# Patient Record
Sex: Male | Born: 1950
Health system: Southern US, Community
[De-identification: ages and names within clinical notes are randomized; demographics above are authoritative.]

## PROBLEM LIST (undated history)

## (undated) DIAGNOSIS — E785 Hyperlipidemia, unspecified: Secondary | ICD-10-CM

## (undated) DIAGNOSIS — I2510 Atherosclerotic heart disease of native coronary artery without angina pectoris: Secondary | ICD-10-CM

## (undated) DIAGNOSIS — T7840XA Allergy, unspecified, initial encounter: Secondary | ICD-10-CM

## (undated) DIAGNOSIS — J45909 Unspecified asthma, uncomplicated: Secondary | ICD-10-CM

## (undated) DIAGNOSIS — I219 Acute myocardial infarction, unspecified: Secondary | ICD-10-CM

## (undated) DIAGNOSIS — C4491 Basal cell carcinoma of skin, unspecified: Secondary | ICD-10-CM

## (undated) DIAGNOSIS — J302 Other seasonal allergic rhinitis: Secondary | ICD-10-CM

## (undated) DIAGNOSIS — I10 Essential (primary) hypertension: Secondary | ICD-10-CM

## (undated) DIAGNOSIS — B019 Varicella without complication: Secondary | ICD-10-CM

## (undated) HISTORY — PX: COSMETIC SURGERY: SHX468

## (undated) HISTORY — PX: OTHER SURGICAL HISTORY: SHX169

## (undated) HISTORY — DX: Varicella without complication: B01.9

## (undated) HISTORY — DX: Allergy, unspecified, initial encounter: T78.40XA

## (undated) HISTORY — PX: TONSILLECTOMY: SUR1361

## (undated) HISTORY — PX: KIDNEY STONE SURGERY: SHX686

## (undated) HISTORY — DX: Essential (primary) hypertension: I10

## (undated) HISTORY — DX: Unspecified asthma, uncomplicated: J45.909

## (undated) HISTORY — DX: Atherosclerotic heart disease of native coronary artery without angina pectoris: I25.10

## (undated) HISTORY — DX: Basal cell carcinoma of skin, unspecified: C44.91

## (undated) HISTORY — PX: EYE SURGERY: SHX253

## (undated) HISTORY — PX: COLONOSCOPY: SHX174

## (undated) HISTORY — PX: CORONARY ANGIOPLASTY WITH STENT PLACEMENT: SHX49

## (undated) HISTORY — PX: MOHS SURGERY: SUR867

## (undated) HISTORY — PX: WRIST GANGLION EXCISION: SUR520

## (undated) HISTORY — DX: Hyperlipidemia, unspecified: E78.5

---

## 1996-08-20 DIAGNOSIS — J45909 Unspecified asthma, uncomplicated: Secondary | ICD-10-CM

## 1996-08-20 HISTORY — DX: Unspecified asthma, uncomplicated: J45.909

## 1998-05-27 ENCOUNTER — Ambulatory Visit (HOSPITAL_COMMUNITY): Admission: RE | Admit: 1998-05-27 | Discharge: 1998-05-27 | Payer: Self-pay | Admitting: Internal Medicine

## 2003-10-15 ENCOUNTER — Encounter: Payer: Self-pay | Admitting: Internal Medicine

## 2004-07-06 ENCOUNTER — Ambulatory Visit: Payer: Self-pay | Admitting: Internal Medicine

## 2004-08-24 ENCOUNTER — Ambulatory Visit: Payer: Self-pay | Admitting: Internal Medicine

## 2004-10-16 ENCOUNTER — Ambulatory Visit: Payer: Self-pay | Admitting: Internal Medicine

## 2005-06-19 ENCOUNTER — Ambulatory Visit: Payer: Self-pay | Admitting: Internal Medicine

## 2006-01-09 ENCOUNTER — Ambulatory Visit: Payer: Self-pay | Admitting: Internal Medicine

## 2006-03-28 ENCOUNTER — Ambulatory Visit: Payer: Self-pay | Admitting: Internal Medicine

## 2006-12-18 DIAGNOSIS — E785 Hyperlipidemia, unspecified: Secondary | ICD-10-CM

## 2006-12-19 ENCOUNTER — Ambulatory Visit: Payer: Self-pay | Admitting: Internal Medicine

## 2006-12-19 LAB — CONVERTED CEMR LAB
ALT: 21 units/L (ref 0–40)
Albumin: 4.3 g/dL (ref 3.5–5.2)
Alkaline Phosphatase: 51 units/L (ref 39–117)
BUN: 19 mg/dL (ref 6–23)
Basophils Absolute: 0 10*3/uL (ref 0.0–0.1)
Basophils Relative: 0.3 % (ref 0.0–1.0)
CO2: 32 meq/L (ref 19–32)
Calcium: 9.4 mg/dL (ref 8.4–10.5)
Cholesterol: 158 mg/dL (ref 0–200)
GFR calc Af Amer: 112 mL/min
HDL: 34.2 mg/dL — ABNORMAL LOW (ref >39.0)
Hemoglobin: 16.2 g/dL (ref 13.0–17.0)
LDL Cholesterol: 104 mg/dL — ABNORMAL HIGH (ref 0–99)
Lymphocytes Relative: 28.2 % (ref 12.0–46.0)
MCHC: 34.9 g/dL (ref 30.0–36.0)
Monocytes Absolute: 0.4 10*3/uL (ref 0.2–0.7)
Monocytes Relative: 7.7 % (ref 3.0–11.0)
Neutro Abs: 3.3 10*3/uL (ref 1.4–7.7)
Platelets: 225 10*3/uL (ref 150–400)
Potassium: 4.5 meq/L (ref 3.5–5.1)
RDW: 12.8 % (ref 11.5–14.6)
TSH: 2.79 microintl units/mL (ref 0.35–5.50)
Triglycerides: 97 mg/dL (ref 0–149)
VLDL: 19 mg/dL (ref 0–40)

## 2007-01-27 ENCOUNTER — Ambulatory Visit: Payer: Self-pay | Admitting: Internal Medicine

## 2007-02-06 ENCOUNTER — Encounter: Payer: Self-pay | Admitting: Internal Medicine

## 2007-02-12 ENCOUNTER — Encounter: Payer: Self-pay | Admitting: Internal Medicine

## 2007-02-17 ENCOUNTER — Ambulatory Visit: Payer: Self-pay | Admitting: Internal Medicine

## 2007-02-17 DIAGNOSIS — E162 Hypoglycemia, unspecified: Secondary | ICD-10-CM

## 2007-07-08 ENCOUNTER — Ambulatory Visit: Payer: Self-pay | Admitting: Internal Medicine

## 2007-12-19 ENCOUNTER — Ambulatory Visit: Payer: Self-pay | Admitting: Internal Medicine

## 2007-12-19 DIAGNOSIS — J309 Allergic rhinitis, unspecified: Secondary | ICD-10-CM

## 2008-01-19 ENCOUNTER — Ambulatory Visit: Payer: Self-pay | Admitting: Internal Medicine

## 2008-01-19 ENCOUNTER — Encounter (INDEPENDENT_AMBULATORY_CARE_PROVIDER_SITE_OTHER): Payer: Self-pay | Admitting: *Deleted

## 2008-01-19 LAB — CONVERTED CEMR LAB
OCCULT 2: NEGATIVE
OCCULT 3: NEGATIVE

## 2008-05-20 ENCOUNTER — Ambulatory Visit: Payer: Self-pay | Admitting: Internal Medicine

## 2008-06-08 ENCOUNTER — Ambulatory Visit: Payer: Self-pay | Admitting: Internal Medicine

## 2008-06-08 LAB — CONVERTED CEMR LAB: Rapid Strep: NEGATIVE

## 2008-08-24 ENCOUNTER — Encounter (INDEPENDENT_AMBULATORY_CARE_PROVIDER_SITE_OTHER): Payer: Self-pay | Admitting: *Deleted

## 2008-12-23 ENCOUNTER — Ambulatory Visit: Payer: Self-pay | Admitting: Internal Medicine

## 2008-12-23 LAB — CONVERTED CEMR LAB
ALT: 16 units/L (ref 0–53)
AST: 24 units/L (ref 0–37)
Albumin: 4.3 g/dL (ref 3.5–5.2)
Alkaline Phosphatase: 49 units/L (ref 39–117)
Calcium: 9.3 mg/dL (ref 8.4–10.5)
Creatinine, Ser: 1 mg/dL (ref 0.4–1.5)
Eosinophils Relative: 0.9 % (ref 0.0–5.0)
HCT: 44.9 % (ref 39.0–52.0)
Hemoglobin: 15.4 g/dL (ref 13.0–17.0)
Lymphs Abs: 1 10*3/uL (ref 0.7–4.0)
Monocytes Relative: 6.3 % (ref 3.0–12.0)
Neutro Abs: 5.4 10*3/uL (ref 1.4–7.7)
PSA: 0.42 ng/mL (ref 0.10–4.00)
Total CHOL/HDL Ratio: 6
Total Protein: 7 g/dL (ref 6.0–8.3)
Triglycerides: 105 mg/dL (ref 0.0–149.0)
WBC: 6.9 10*3/uL (ref 4.5–10.5)

## 2008-12-24 ENCOUNTER — Ambulatory Visit: Payer: Self-pay | Admitting: Internal Medicine

## 2009-02-03 ENCOUNTER — Ambulatory Visit: Payer: Self-pay | Admitting: Internal Medicine

## 2009-08-24 ENCOUNTER — Telehealth (INDEPENDENT_AMBULATORY_CARE_PROVIDER_SITE_OTHER): Payer: Self-pay | Admitting: *Deleted

## 2009-11-24 ENCOUNTER — Telehealth (INDEPENDENT_AMBULATORY_CARE_PROVIDER_SITE_OTHER): Payer: Self-pay | Admitting: *Deleted

## 2009-12-19 ENCOUNTER — Ambulatory Visit: Payer: Self-pay | Admitting: Internal Medicine

## 2009-12-19 LAB — CONVERTED CEMR LAB
ALT: 20 units/L (ref 0–53)
AST: 26 units/L (ref 0–37)
Albumin: 4.1 g/dL (ref 3.5–5.2)
Basophils Relative: 0.5 % (ref 0.0–3.0)
Calcium: 8.9 mg/dL (ref 8.4–10.5)
Cholesterol: 178 mg/dL (ref 0–200)
Eosinophils Relative: 3.1 % (ref 0.0–5.0)
GFR calc non Af Amer: 91.61 mL/min (ref >60)
HCT: 42.7 % (ref 39.0–52.0)
Lymphs Abs: 1.4 10*3/uL (ref 0.7–4.0)
MCV: 90.5 fL (ref 78.0–100.0)
Monocytes Absolute: 0.4 10*3/uL (ref 0.1–1.0)
Neutro Abs: 3.3 10*3/uL (ref 1.4–7.7)
PSA: 1.32 ng/mL (ref 0.10–4.00)
Platelets: 213 10*3/uL (ref 150.0–400.0)
Sodium: 143 meq/L (ref 135–145)
TSH: 3.44 microintl units/mL (ref 0.35–5.50)
Total Protein: 6.5 g/dL (ref 6.0–8.3)
Triglycerides: 105 mg/dL (ref 0.0–149.0)
WBC: 5.2 10*3/uL (ref 4.5–10.5)

## 2009-12-20 ENCOUNTER — Encounter: Payer: Self-pay | Admitting: Internal Medicine

## 2009-12-26 ENCOUNTER — Ambulatory Visit: Payer: Self-pay | Admitting: Internal Medicine

## 2009-12-26 LAB — CONVERTED CEMR LAB
Bilirubin Urine: NEGATIVE
Protein, U semiquant: NEGATIVE
Urobilinogen, UA: 0.2

## 2009-12-27 ENCOUNTER — Ambulatory Visit: Payer: Self-pay | Admitting: Internal Medicine

## 2009-12-27 LAB — CONVERTED CEMR LAB: OCCULT 3: NEGATIVE

## 2009-12-28 ENCOUNTER — Encounter (INDEPENDENT_AMBULATORY_CARE_PROVIDER_SITE_OTHER): Payer: Self-pay | Admitting: *Deleted

## 2010-01-09 ENCOUNTER — Ambulatory Visit: Payer: Self-pay | Admitting: Internal Medicine

## 2010-01-09 LAB — CONVERTED CEMR LAB
Glucose, Urine, Semiquant: NEGATIVE
Ketones, urine, test strip: NEGATIVE
Specific Gravity, Urine: 1.01
pH: 6.5

## 2010-06-01 ENCOUNTER — Ambulatory Visit: Payer: Self-pay | Admitting: Internal Medicine

## 2010-09-17 LAB — CONVERTED CEMR LAB
AST: 28 units/L (ref 0–37)
Basophils Absolute: 0 10*3/uL (ref 0.0–0.1)
Basophils Relative: 0.3 % (ref 0.0–1.0)
Bilirubin, Direct: 0.1 mg/dL (ref 0.0–0.3)
Chloride: 106 meq/L (ref 96–112)
Cholesterol, target level: 200 mg/dL
Cholesterol: 156 mg/dL (ref 0–200)
Creatinine, Ser: 1 mg/dL (ref 0.4–1.5)
Eosinophils Absolute: 0.2 10*3/uL (ref 0.0–0.7)
GFR calc non Af Amer: 82 mL/min
HDL: 27.2 mg/dL — ABNORMAL LOW (ref >39.0)
LDL Cholesterol: 108 mg/dL — ABNORMAL HIGH (ref 0–99)
Lymphocytes Relative: 34.6 % (ref 12.0–46.0)
MCHC: 34 g/dL (ref 30.0–36.0)
MCV: 91.8 fL (ref 78.0–100.0)
Neutrophils Relative %: 54.3 % (ref 43.0–77.0)
PSA: 0.51 ng/mL (ref 0.10–4.00)
Platelets: 202 10*3/uL (ref 150–400)
RBC: 4.97 M/uL (ref 4.22–5.81)
RDW: 12.7 % (ref 11.5–14.6)
Sodium: 145 meq/L (ref 135–145)
TSH: 2.49 microintl units/mL (ref 0.35–5.50)
Total Bilirubin: 0.8 mg/dL (ref 0.3–1.2)
Triglycerides: 105 mg/dL (ref 0–149)
VLDL: 21 mg/dL (ref 0–40)

## 2010-09-21 NOTE — Progress Notes (Signed)
Summary: LAB ORDERS NEEDED  Phone Note From Other Clinic   Summary of Call: UDIP,Stool Cards,PSA,TSH,CBCD,Lipid,Hep,BMP, v70.0,272.4,995.20   Dale Gonzalez  August 25, 2009 11:42 AM  Summary of Call: PATIENT COMING FOR CPX ON 12-30-2009, AND FASTING LABS ON 12-26-2009, MAY I PLEASE HAVE LAB ORDERS? Initial call taken by: Magdalen Spatz [facility],  August 24, 2009 8:48 AM  Follow-up for Phone Call        LABS ENTERED. Follow-up by: Magdalen Spatz [facility],  August 25, 2009 1:15 PM

## 2010-09-21 NOTE — Progress Notes (Signed)
Summary: Phone-cpx labs  Phone Note Call from Patient   Caller: Patient Summary of Call: Patient has an appt on May 9,2011 for a cpx and requesting labs prior. Please advise Initial call taken by: Barb Merino,  November 24, 2009 1:03 PM  Follow-up for Phone Call        TLB-Lipid Panel [80061-LIPID] TLB-CBC Platelet - w/Differential [85025-CBCD] TLB-TSH (Thyroid Stimulating Hormone) [04540-JWJ] TLB-Hepatic/Liver Function Pnl [80076-HEPATIC] TLB-BMP (Basic Metabolic Panel-BMET) [80048-METABOL] TLB-PSA (Prostate Specific Antigen) [19147-WGN] STOOL CARDS & UDIP V70.0 Follow-up by: Shonna Chock,  November 24, 2009 5:16 PM    Additional Follow-up for Phone Call Additional follow up Details #2::    :abs are scheduled on May 2,2011 Follow-up by: Barb Merino,  November 25, 2009 8:07 AM
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
## 2010-09-21 NOTE — Assessment & Plan Note (Signed)
Summary: flu shot/cbs  Nurse Visit  CC: Flu shot./kb   Allergies: 1)  ! * Cialis  Orders Added: 1)  Admin 1st Vaccine [90471] 2)  Flu Vaccine 52yrs + [[address]]           Flu Vaccine Consent Questions     Do you have a history of severe allergic reactions to this vaccine? no    Any prior history of allergic reactions to egg and/or gelatin? no    Do you have a sensitivity to the preservative Thimersol? no    Do you have a past history of Guillan-Barre Syndrome? no    Do you currently have an acute febrile illness? no    Have you ever had a severe reaction to latex? no    Vaccine information given and explained to patient? yes    Are you currently pregnant? no    Lot Number:[id number]   Exp Date:02/17/2011   Site Given  Right Deltoid IMu

## 2010-09-21 NOTE — Letter (Signed)
Summary: Results Follow up Letter  [facility] [place]  [address] [place], [place] [address]   Phone: [phone]  Fax: [phone]    12/28/2009 MRN: [id number]  MARWIN PRIMMER [address] [place], [place]  [address]  Dear Mr. Allport,  The following are the results of your recent test(s):  Test         Result    Pap Smear:        Normal _____  Not Normal _____ Comments: ______________________________________________________ Cholesterol: LDL(Bad cholesterol):         Your goal is less than:         HDL (Good cholesterol):       Your goal is more than: Comments:  ______________________________________________________ Mammogram:        Normal _____  Not Normal _____ Comments:  ___________________________________________________________________ Hemoccult:        Normal __X___  Not normal _______ Comments:    _____________________________________________________________________ Other Tests:    We routinely do not discuss normal results over the telephone.  If you desire a copy of the results, or you have any questions about this information we can discuss them at your next office visit.   Sincerely,

## 2010-12-19 ENCOUNTER — Telehealth: Payer: Self-pay | Admitting: Internal Medicine

## 2010-12-19 NOTE — Telephone Encounter (Signed)
Patient has cpx on 5/8---called today and scheduled his cpx labs for tomorrow 5/2---what orders and codes do you need??    Thanks so much

## 2010-12-19 NOTE — Telephone Encounter (Signed)
Entered lab info on 5/2 labs

## 2010-12-19 NOTE — Telephone Encounter (Signed)
TLB-Lipid Panel [80061-LIPID] TLB-CBC Platelet - w/Differential [85025-CBCD] TLB-TSH (Thyroid Stimulating Hormone) [84443-TSH] TLB-Hepatic/Liver Function Pnl [80076-HEPATIC] TLB-BMP (Basic Metabolic Panel-BMET) [80048-METABOL] TLB-PSA (Prostate Specific Antigen) [[address]-UEA] STOOL CARDS & UDIP V70.0/272.4

## 2010-12-20 ENCOUNTER — Other Ambulatory Visit (INDEPENDENT_AMBULATORY_CARE_PROVIDER_SITE_OTHER): Payer: PRIVATE HEALTH INSURANCE

## 2010-12-20 DIAGNOSIS — Z0000 Encounter for general adult medical examination without abnormal findings: Secondary | ICD-10-CM

## 2010-12-20 LAB — CBC WITH DIFFERENTIAL/PLATELET
Eosinophils Relative: 2 % (ref 0.0–5.0)
HCT: 47.2 % (ref 39.0–52.0)
Lymphs Abs: 1.7 10*3/uL (ref 0.7–4.0)
Monocytes Relative: 7.8 % (ref 3.0–12.0)
Platelets: 205 10*3/uL (ref 150.0–400.0)
WBC: 8.8 10*3/uL (ref 4.5–10.5)

## 2010-12-20 LAB — LIPID PANEL
HDL: 31.8 mg/dL — ABNORMAL LOW (ref >39.00)
LDL Cholesterol: 130 mg/dL — ABNORMAL HIGH (ref 0–99)
Total CHOL/HDL Ratio: 6
Triglycerides: 159 mg/dL — ABNORMAL HIGH (ref 0.0–149.0)
VLDL: 31.8 mg/dL (ref 0.0–40.0)

## 2010-12-20 LAB — BASIC METABOLIC PANEL
Calcium: 9.3 mg/dL (ref 8.4–10.5)
Creatinine, Ser: 1.1 mg/dL (ref 0.4–1.5)
GFR: 75.59 mL/min (ref >60.00)
Glucose, Bld: 83 mg/dL (ref 70–99)
Sodium: 140 mEq/L (ref 135–145)

## 2010-12-20 LAB — POCT URINALYSIS DIPSTICK
Glucose, UA: NEGATIVE
Nitrite, UA: NEGATIVE
Protein, UA: NEGATIVE
Spec Grav, UA: 1.015
Urobilinogen, UA: NEGATIVE

## 2010-12-20 LAB — TSH: TSH: 3.62 u[IU]/mL (ref 0.35–5.50)

## 2010-12-20 LAB — HEPATIC FUNCTION PANEL
AST: 24 U/L (ref 0–37)
Albumin: 4.2 g/dL (ref 3.5–5.2)

## 2010-12-22 LAB — URINE CULTURE: Organism ID, Bacteria: NO GROWTH

## 2010-12-23 ENCOUNTER — Encounter: Payer: Self-pay | Admitting: Internal Medicine

## 2010-12-26 ENCOUNTER — Telehealth: Payer: Self-pay | Admitting: Internal Medicine

## 2010-12-26 ENCOUNTER — Encounter: Payer: Self-pay | Admitting: Internal Medicine

## 2010-12-26 ENCOUNTER — Ambulatory Visit (INDEPENDENT_AMBULATORY_CARE_PROVIDER_SITE_OTHER): Payer: PRIVATE HEALTH INSURANCE | Admitting: Internal Medicine

## 2010-12-26 VITALS — BP 116/70 | HR 60 | Temp 98.4°F | Resp 14 | Ht 67.25 in | Wt 174.2 lb

## 2010-12-26 DIAGNOSIS — Z0000 Encounter for general adult medical examination without abnormal findings: Secondary | ICD-10-CM

## 2010-12-26 DIAGNOSIS — E785 Hyperlipidemia, unspecified: Secondary | ICD-10-CM

## 2010-12-26 NOTE — Telephone Encounter (Signed)
Added info to 5/14 labs

## 2010-12-26 NOTE — Telephone Encounter (Signed)
Hematuria 599.7

## 2010-12-26 NOTE — Telephone Encounter (Signed)
Per instructions on 5/8 office visit; "repeat urine dip in 6 days after standing from bike riding"---what code do you need?    Has lab appt for 5/14 at 8:15     thanks
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
## 2010-12-26 NOTE — Assessment & Plan Note (Signed)
NMR Lipoprofile 2005: LDL 120 (1426/763), HDL 29, TG 113.  LDL  Goal =  < 110, ideal = < 80. Father MI @ 39.
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
## 2010-12-28 ENCOUNTER — Encounter: Payer: Self-pay | Admitting: Internal Medicine

## 2010-12-29 ENCOUNTER — Other Ambulatory Visit: Payer: Self-pay | Admitting: *Deleted

## 2010-12-29 DIAGNOSIS — R319 Hematuria, unspecified: Secondary | ICD-10-CM

## 2011-01-01 ENCOUNTER — Other Ambulatory Visit (INDEPENDENT_AMBULATORY_CARE_PROVIDER_SITE_OTHER): Payer: PRIVATE HEALTH INSURANCE

## 2011-01-01 DIAGNOSIS — R319 Hematuria, unspecified: Secondary | ICD-10-CM

## 2011-01-01 LAB — POCT URINALYSIS DIPSTICK
Bilirubin, UA: NEGATIVE
Glucose, UA: NEGATIVE
Leukocytes, UA: NEGATIVE
Protein, UA: NEGATIVE
Urobilinogen, UA: 0.2

## 2011-01-04 ENCOUNTER — Ambulatory Visit (INDEPENDENT_AMBULATORY_CARE_PROVIDER_SITE_OTHER): Payer: PRIVATE HEALTH INSURANCE | Admitting: *Deleted

## 2011-01-04 DIAGNOSIS — Z23 Encounter for immunization: Secondary | ICD-10-CM

## 2011-01-04 LAB — URINE CULTURE: Colony Count: NO GROWTH
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
## 2011-01-05 NOTE — Letter (Signed)
February 19, 2007    Kathyrn Lass.  [employer]  [address]  [place], [place] [address]   RE:  FRANKE, MENTER  MRN:  [id number]  /  DOB:  [birth year]   Dear Mr. Durward Parcel:   I saw Mr. Madilyn Fireman on June 30 to address concerns about his  health history and appropriateness for insurance.   Specific questions include a nuclear stress test and a question of  transient global anemia.   Unfortunately, the global anemia  is a typographical error referring  to an apparent reactive hypoglycemic episode, which occurred  approximately an hour after an intensive exercise program.  He actually  experienced some mild light-headedness with poor recall of immediate  events after the workout.  His workout consisted of incredibly high  cardiovascular exercise for up to an hour maintaining heart rates in the  120s.  These episodes occured in  November 2005 and January 2006.  There  has been no recurrence as he has been instructed to employ protein  supplement prior to such intense exercise program to prevent any  hypoglycemia.   His stress EKG will be retrieved from storage.  This was done as he had  planned to compete in a series of  marathons.   He has completed several 26-mile marathons, as well as half marathons.  He continues to exercise for over 3 to 4 hours a week at extremely high  levels with no cardiopulmonary symptoms.   My exam reveals no cardiopulmonary or neurologic deficits.   There would be no contraindications to insurability and there would be  no restrictions to any activities.  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 Wissmann is one  of the most physically fit  individuals with whom I have had the pleasure of working.   If additional information is needed, please do not hesitate to contact  me.    Sincerely,      Titus Dubin. Alwyn Ren, MD,FACP,FCCP  Electronically Signed    [facility]  DD: 02/19/2007  DT: 02/19/2007   Job #: [id number]   CC:    Darl Householder

## 2011-01-23 ENCOUNTER — Telehealth: Payer: Self-pay

## 2011-01-23 MED ORDER — AZITHROMYCIN 250 MG PO TABS: 250.0000 mg | ORAL_TABLET | Freq: Every day | ORAL | Status: AC

## 2011-01-23 NOTE — Telephone Encounter (Signed)
Pt called wife diagnosed with strep pt says he also is starting to develop sore throat. Hop would you like to treat husband since he has been exposed?

## 2011-01-23 NOTE — Telephone Encounter (Signed)
Pt aware rx sent to pharmacy.

## 2011-01-23 NOTE — Telephone Encounter (Signed)
Zpack if not allergic

## 2011-09-03 ENCOUNTER — Telehealth: Payer: Self-pay | Admitting: Internal Medicine

## 2011-09-03 DIAGNOSIS — Z0000 Encounter for general adult medical examination without abnormal findings: Secondary | ICD-10-CM

## 2011-09-03 DIAGNOSIS — E785 Hyperlipidemia, unspecified: Secondary | ICD-10-CM

## 2011-09-03 NOTE — Telephone Encounter (Signed)
Patient has cpe for 5-9. He states that he always comes in a week early for labs. Please order.

## 2011-09-04 NOTE — Telephone Encounter (Signed)
Appointment scheduled and future orders placed

## 2011-10-01 ENCOUNTER — Telehealth: Payer: Self-pay

## 2011-10-01 NOTE — Telephone Encounter (Signed)
Dr.Hopper please place order for colonoscopy and close encounter

## 2011-10-02 ENCOUNTER — Other Ambulatory Visit: Payer: Self-pay | Admitting: Internal Medicine

## 2011-10-02 ENCOUNTER — Encounter: Payer: Self-pay | Admitting: Gastroenterology

## 2011-10-02 DIAGNOSIS — Z1211 Encounter for screening for malignant neoplasm of colon: Secondary | ICD-10-CM

## 2011-11-22 ENCOUNTER — Encounter: Payer: Self-pay | Admitting: Internal Medicine

## 2011-11-22 ENCOUNTER — Ambulatory Visit (INDEPENDENT_AMBULATORY_CARE_PROVIDER_SITE_OTHER): Payer: PRIVATE HEALTH INSURANCE | Admitting: Internal Medicine

## 2011-11-22 VITALS — BP 124/78 | HR 75 | Temp 98.0°F | Wt 177.0 lb

## 2011-11-22 DIAGNOSIS — J209 Acute bronchitis, unspecified: Secondary | ICD-10-CM

## 2011-11-22 MED ORDER — MOXIFLOXACIN HCL 400 MG PO TABS: 400.0000 mg | ORAL_TABLET | Freq: Every day | ORAL | Status: DC

## 2011-11-22 MED ORDER — HYDROCODONE-HOMATROPINE 5-1.5 MG/5ML PO SYRP: 5.0000 mL | ORAL_SOLUTION | Freq: Four times a day (QID) | ORAL | Status: DC | PRN

## 2011-11-22 MED ORDER — PREDNISONE 20 MG PO TABS: 20.0000 mg | ORAL_TABLET | Freq: Two times a day (BID) | ORAL | Status: AC

## 2011-11-22 NOTE — Progress Notes (Signed)
  Subjective:    Patient ID: Dale Gonzalez, male    DOB: 1951-05-12, 61 y.o.   MRN: [id number]  HPI Respiratory tract infection Onset/symptoms:11/16/11 as ST  Exposures (illness/environmental/extrinsic):no Progression of symptoms:to cough from [facility] Treatments/response:Symbicort w/o benefit Present symptoms: Fever/chills/sweats:no Frontal headache:no Facial pain:no Nasal purulence:no Sore throat:not now Dental pain:no Lymphadenopathy:no Wheezing/shortness of breath:yes Cough/sputum/hemoptysis:green with streaks of red Pleuritic pain:no Associated extrinsic/allergic symptoms:itchy eyes/ sneezing:no Past medical history: Seasonal allergies : yes/asthma:no Smoking history:never           Review of Systems     Objective:   Physical Exam General appearance:good health ;well nourished; no acute distress or increased work of breathing is present but horrific paroxysmal cough.  No  lymphadenopathy about the head, neck, or axilla noted.   Eyes: No conjunctival inflammation or lid edema is present.   Ears:  External ear exam shows no significant lesions or deformities.  Otoscopic examination reveals clear canals, tympanic membranes are intact bilaterally without bulging, retraction, inflammation or discharge.  Nose:  External nasal examination shows no deformity or inflammation. Nasal mucosa are dry  without lesions or exudates. No septal dislocation or deviation.No obstruction to airflow.   Oral exam: Dental hygiene is good; lips and gums are healthy appearing.There is no oropharyngeal erythema or exudate noted. Hoarse  Neck:  No deformities, thyromegaly, masses, or tenderness noted.   Supple with full range of motion without pain.   Heart:  Slow rate and regular rhythm. S1 and S2 normal without gallop, murmur, click, rub or other extra sounds.   Lungs:Chest clear to auscultation; no wheezes, rhonchi,rales ,or rubs present.No increased work of breathing.  Severe , paroxysmal,  brassy cough  Extremities:  No cyanosis, edema, or clubbing  noted    Skin: Warm & dry           Assessment & Plan:  #1 acute bronchitis with probable  Bronchospasm manifested as paroxysmal cough. Streaky hemoptysis from severe cough Plan: See orders and recommendations

## 2011-11-22 NOTE — Patient Instructions (Signed)
Order for x-rays entered into  the computer; these will be performed at [address] across from [facility]. No appointment is necessary. 

## 2011-11-23 ENCOUNTER — Ambulatory Visit (INDEPENDENT_AMBULATORY_CARE_PROVIDER_SITE_OTHER)
Admission: RE | Admit: 2011-11-23 | Discharge: 2011-11-23 | Disposition: A | Discharge status: Home / Self Care | Payer: PRIVATE HEALTH INSURANCE | Source: Ambulatory Visit | Attending: Internal Medicine | Admitting: Internal Medicine

## 2011-11-23 DIAGNOSIS — J209 Acute bronchitis, unspecified: Secondary | ICD-10-CM

## 2011-11-26 ENCOUNTER — Ambulatory Visit: Payer: PRIVATE HEALTH INSURANCE | Admitting: Internal Medicine

## 2011-11-27 ENCOUNTER — Ambulatory Visit (AMBULATORY_SURGERY_CENTER): Payer: PRIVATE HEALTH INSURANCE | Admitting: *Deleted

## 2011-11-27 ENCOUNTER — Encounter: Payer: Self-pay | Admitting: Gastroenterology

## 2011-11-27 VITALS — Ht 68.0 in | Wt 176.2 lb

## 2011-11-27 DIAGNOSIS — Z1211 Encounter for screening for malignant neoplasm of colon: Secondary | ICD-10-CM

## 2011-11-27 MED ORDER — PEG-KCL-NACL-NASULF-NA ASC-C 100 G PO SOLR: ORAL | Status: DC

## 2011-12-11 ENCOUNTER — Encounter: Payer: Self-pay | Admitting: Gastroenterology

## 2011-12-11 ENCOUNTER — Encounter: Payer: PRIVATE HEALTH INSURANCE | Admitting: Gastroenterology

## 2011-12-11 ENCOUNTER — Ambulatory Visit (AMBULATORY_SURGERY_CENTER): Payer: PRIVATE HEALTH INSURANCE | Admitting: Gastroenterology

## 2011-12-11 VITALS — BP 117/84 | HR 67 | Temp 95.6°F | Resp 18 | Ht 68.0 in | Wt 176.0 lb

## 2011-12-11 DIAGNOSIS — Z1211 Encounter for screening for malignant neoplasm of colon: Secondary | ICD-10-CM

## 2011-12-11 MED ORDER — SODIUM CHLORIDE 0.9 % IV SOLN: 500.0000 mL | INTRAVENOUS | Status: DC

## 2011-12-11 NOTE — Patient Instructions (Signed)
Resume your prior medications today.  Please call if any questions or concerns.    YOU HAD AN ENDOSCOPIC PROCEDURE TODAY AT THE [facility]: Refer to the procedure report that was given to you for any specific questions about what was found during the examination.  If the procedure report does not answer your questions, please call your gastroenterologist to clarify.  If you requested that your care partner not be given the details of your procedure findings, then the procedure report has been included in a sealed envelope for you to review at your convenience later.  YOU SHOULD EXPECT: Some feelings of bloating in the abdomen. Passage of more gas than usual.  Walking can help get rid of the air that was put into your GI tract during the procedure and reduce the bloating. If you had a lower endoscopy (such as a colonoscopy or flexible sigmoidoscopy) you may notice spotting of blood in your stool or on the toilet paper. If you underwent a bowel prep for your procedure, then you may not have a normal bowel movement for a few days.  DIET: Your first meal following the procedure should be a light meal and then it is ok to progress to your normal diet.  A half-sandwich or bowl of soup is an example of a good first meal.  Heavy or fried foods are harder to digest and may make you feel nauseous or bloated.  Likewise meals heavy in dairy and vegetables can cause extra gas to form and this can also increase the bloating.  Drink plenty of fluids but you should avoid alcoholic beverages for 24 hours.  ACTIVITY: Your care partner should take you home directly after the procedure.  You should plan to take it easy, moving slowly for the rest of the day.  You can resume normal activity the day after the procedure however you should NOT DRIVE or use heavy machinery for 24 hours (because of the sedation medicines used during the test).    SYMPTOMS TO REPORT IMMEDIATELY: A gastroenterologist can be reached  at any hour.  During normal business hours, 8:30 AM to 5:00 PM Monday through Friday, call [phone].  After hours and on weekends, please call the GI answering service at [phone] who will take a message and have the physician on call contact you.   Following lower endoscopy (colonoscopy or flexible sigmoidoscopy):  Excessive amounts of blood in the stool  Significant tenderness or worsening of abdominal pains  Swelling of the abdomen that is new, acute  Fever of 100F or higher    FOLLOW UP: If any biopsies were taken you will be contacted by phone or by letter within the next 1-3 weeks.  Call your gastroenterologist if you have not heard about the biopsies in 3 weeks.  Our staff will call the home number listed on your records the next business day following your procedure to check on you and address any questions or concerns that you may have at that time regarding the information given to you following your procedure. This is a courtesy call and so if there is no answer at the home number and we have not heard from you through the emergency physician on call, we will assume that you have returned to your regular daily activities without incident.  SIGNATURES/CONFIDENTIALITY: You and/or your care partner have signed paperwork which will be entered into your electronic medical record.  These signatures attest to the fact that that the information above on  your After Visit Summary has been reviewed and is understood.  Full responsibility of the confidentiality of this discharge information lies with you and/or your care-partner.

## 2011-12-11 NOTE — Progress Notes (Signed)
No complaints noted in the recovery room. Maw   

## 2011-12-11 NOTE — Progress Notes (Signed)
Patient did not experience any of the following events: a burn prior to discharge; a fall within the facility; wrong site/side/patient/procedure/implant event; or a hospital transfer or hospital admission upon discharge from the facility. (G8907) Patient did not have preoperative order for IV antibiotic SSI prophylaxis. (G8918)  

## 2011-12-11 NOTE — Progress Notes (Addendum)
Propofol per [facility] crna. All  meds titrated per crna and md.  See scanned intra procedure report. ewm  Pt tolerated procedure well. ewm

## 2011-12-11 NOTE — Op Note (Signed)
[facility] [address] [place], [place]  [address]  COLONOSCOPY PROCEDURE REPORT  PATIENT:  Dale Gonzalez, Dale Gonzalez  MR#:  [id number] BIRTHDATE:  02-Sep-1950, 60 yrs. old  GENDER:  male ENDOSCOPIST:  Vania Rea. Jarold Motto, MD, [facility] REF. BY: PROCEDURE DATE:  12/11/2011 PROCEDURE:  Average-risk screening colonoscopy G0121 ASA CLASS:  Class II INDICATIONS:  Routine Risk Screening MEDICATIONS:   propofol (Diprivan) 150 mg IV  DESCRIPTION OF PROCEDURE:   After the risks and benefits and of the procedure were explained, informed consent was obtained. Digital rectal exam was performed and revealed no abnormalities. The LB [id number] endoscope was introduced through the anus and advanced to the cecum, which was identified by both the appendix and ileocecal valve.  The quality of the prep was excellent, using MoviPrep.  The instrument was then slowly withdrawn as the colon was fully examined. <<PROCEDUREIMAGES>>  FINDINGS:  No polyps or cancers were seen.  This was otherwise a normal examination of the colon.   Retroflexed views in the rectum revealed no abnormalities.    The scope was then withdrawn from the patient and the procedure completed.  COMPLICATIONS:  None ENDOSCOPIC IMPRESSION: 1) No polyps or cancers 2) Otherwise normal examination RECOMMENDATIONS: 1) Continue current colorectal screening recommendations for "routine risk" patients with a repeat colonoscopy in 10 years.  REPEAT EXAM:  No  ______________________________ Vania Rea. Jarold Motto, MD, [facility] Graham  CC:  Pecola Lawless, MD  n. Rosalie DoctorMarland Kitchen   Vania Rea. Hannalee Castor at 12/11/2011 08:53 AM  [name], [phone]

## 2011-12-12 ENCOUNTER — Telehealth: Payer: Self-pay | Admitting: *Deleted

## 2011-12-12 NOTE — Telephone Encounter (Signed)
  Follow up Call-  Call back number 12/11/2011  Post procedure Call Back phone  # [phone]  Permission to leave phone message Yes     [facility]

## 2011-12-14 ENCOUNTER — Encounter: Payer: PRIVATE HEALTH INSURANCE | Admitting: Gastroenterology

## 2011-12-19 ENCOUNTER — Other Ambulatory Visit (INDEPENDENT_AMBULATORY_CARE_PROVIDER_SITE_OTHER): Payer: PRIVATE HEALTH INSURANCE

## 2011-12-19 DIAGNOSIS — E785 Hyperlipidemia, unspecified: Secondary | ICD-10-CM

## 2011-12-19 DIAGNOSIS — Z0000 Encounter for general adult medical examination without abnormal findings: Secondary | ICD-10-CM

## 2011-12-19 LAB — HEPATIC FUNCTION PANEL
ALT: 24 U/L (ref 0–53)
Albumin: 4 g/dL (ref 3.5–5.2)
Bilirubin, Direct: 0 mg/dL (ref 0.0–0.3)
Total Protein: 6.5 g/dL (ref 6.0–8.3)

## 2011-12-19 LAB — CBC WITH DIFFERENTIAL/PLATELET
Basophils Absolute: 0 10*3/uL (ref 0.0–0.1)
Hemoglobin: 14.9 g/dL (ref 13.0–17.0)
Lymphocytes Relative: 31.4 % (ref 12.0–46.0)
Monocytes Relative: 8 % (ref 3.0–12.0)
Neutro Abs: 2.4 10*3/uL (ref 1.4–7.7)
Platelets: 186 10*3/uL (ref 150.0–400.0)
RDW: 14.4 % (ref 11.5–14.6)
WBC: 4.3 10*3/uL — ABNORMAL LOW (ref 4.5–10.5)

## 2011-12-19 LAB — LIPID PANEL
Cholesterol: 185 mg/dL (ref 0–200)
HDL: 39.6 mg/dL (ref >39.00)
Triglycerides: 131 mg/dL (ref 0.0–149.0)
VLDL: 26.2 mg/dL (ref 0.0–40.0)

## 2011-12-19 LAB — POCT URINALYSIS DIPSTICK
Bilirubin, UA: NEGATIVE
Glucose, UA: NEGATIVE
Nitrite, UA: NEGATIVE

## 2011-12-19 LAB — BASIC METABOLIC PANEL
BUN: 19 mg/dL (ref 6–23)
GFR: 85.5 mL/min (ref >60.00)
Potassium: 4 mEq/L (ref 3.5–5.1)

## 2011-12-19 LAB — TSH: TSH: 2.48 u[IU]/mL (ref 0.35–5.50)

## 2011-12-21 LAB — URINE CULTURE
Colony Count: NO GROWTH
Organism ID, Bacteria: NO GROWTH

## 2011-12-27 ENCOUNTER — Encounter: Payer: Self-pay | Admitting: Internal Medicine

## 2011-12-27 ENCOUNTER — Ambulatory Visit (INDEPENDENT_AMBULATORY_CARE_PROVIDER_SITE_OTHER): Payer: PRIVATE HEALTH INSURANCE | Admitting: Internal Medicine

## 2011-12-27 VITALS — BP 130/76 | HR 68 | Temp 98.4°F | Ht 67.0 in | Wt 174.8 lb

## 2011-12-27 DIAGNOSIS — E785 Hyperlipidemia, unspecified: Secondary | ICD-10-CM

## 2011-12-27 DIAGNOSIS — C4491 Basal cell carcinoma of skin, unspecified: Secondary | ICD-10-CM | POA: Insufficient documentation

## 2011-12-27 DIAGNOSIS — R3129 Other microscopic hematuria: Secondary | ICD-10-CM | POA: Insufficient documentation

## 2011-12-27 DIAGNOSIS — Z0000 Encounter for general adult medical examination without abnormal findings: Secondary | ICD-10-CM

## 2011-12-27 DIAGNOSIS — R9431 Abnormal electrocardiogram [ECG] [EKG]: Secondary | ICD-10-CM | POA: Insufficient documentation

## 2011-12-27 NOTE — Progress Notes (Signed)
  Subjective:    Patient ID: Dale Gonzalez, male    DOB: July 07, 1951, 61 y.o.   MRN: [id number]  HPI  Dale Gonzalez  is here for a physical; he has no acute health  Issues.      Review of Systems Patient reports no  vision/ hearing changes,anorexia, weight change, fever ,adenopathy, persistant / recurrent hoarseness, swallowing issues, chest pain,palpitations, edema,persistant / recurrent cough, hemoptysis, dyspnea(rest, exertional, paroxysmal nocturnal), gastrointestinal  bleeding (melena, rectal bleeding), abdominal pain, excessive heart burn, GU symptoms( dysuria, hematuria, pyuria, voiding/incontinence  issues) syncope, focal weakness, memory loss,numbness & tingling, skin/hair/nail changes,depression, anxiety, abnormal bruising/bleeding,or musculoskeletal symptoms/signs.      Objective:   Physical Exam Gen.: Extremely fit  and well-nourished in appearance. Alert, appropriate and cooperative throughout exam. Head: Normocephalic without obvious abnormalities  Eyes: No corneal or conjunctival inflammation noted. Pupils equal round reactive to light and accommodation. Fundal exam is benign without hemorrhages, exudate, papilledema. Extraocular motion intact. Vision grossly normal. Ears: External  ear exam reveals no significant lesions or deformities. Canals clear .TMs normal. Hearing is grossly normal bilaterally. Nose: External nasal exam reveals no deformity or inflammation. Nasal mucosa are pink and moist. No lesions or exudates noted.   Mouth: Oral mucosa and oropharynx reveal no lesions or exudates. Teeth in good repair. Neck: No deformities, masses, or tenderness noted. Range of motion & Thyroid normal. Lungs: Normal respiratory effort; chest expands symmetrically. Lungs are clear to auscultation without rales, wheezes, or increased work of breathing. Heart: Normal rate and rhythm. Normal S1 and S2. No gallop, click, or rub. Soft S4 w/o murmur. Abdomen: Bowel sounds normal; abdomen soft  and nontender. No masses, organomegaly or hernias noted. Genitalia: Normal ; no DRE (colonoscopy within past 2 weeks).                                                                                   Musculoskeletal/extremities: No deformity or scoliosis noted of  the thoracic or lumbar spine. No clubbing, cyanosis, edema, or deformity noted. Range of motion  normal .Tone & strength  normal.Joints normal. Nail health  good. Vascular: Carotid, radial artery, dorsalis pedis and  posterior tibial pulses are full and equal. No bruits present. Neurologic: Alert and oriented x3. Deep tendon reflexes symmetrical and normal.          Skin: Intact without suspicious lesions or rashes. Lymph: No cervical, axillary, or inguinal lymphadenopathy present. Psych: Mood and affect are normal. Normally interactive                                                                                         Assessment & Plan:  #1 comprehensive physical exam; no acute findings #2 see Problem List with Assessments & Recommendations Plan: see Orders

## 2011-12-27 NOTE — Assessment & Plan Note (Signed)
LDL 119; no change in medications is  indicated

## 2011-12-27 NOTE — Patient Instructions (Signed)
EKG is normal but there are minor ST-T changes of early repolarization. These are normal variants but could be mistaken for acute injury. This EKG should be available for comparison if  seen emergently.  

## 2011-12-27 NOTE — Assessment & Plan Note (Signed)
I recommend that he recheck the urinalysis after not biking for at least a week. I've also asked him to discuss this with Dr. Logan Bores.

## 2011-12-28 ENCOUNTER — Encounter: Payer: PRIVATE HEALTH INSURANCE | Admitting: Internal Medicine

## 2012-01-17 ENCOUNTER — Telehealth: Payer: Self-pay | Admitting: Internal Medicine

## 2012-01-17 NOTE — Telephone Encounter (Signed)
figured out close enct

## 2012-03-13 ENCOUNTER — Encounter (HOSPITAL_COMMUNITY): Payer: Self-pay | Admitting: Emergency Medicine

## 2012-03-13 ENCOUNTER — Other Ambulatory Visit: Payer: Self-pay

## 2012-03-13 ENCOUNTER — Emergency Department (HOSPITAL_COMMUNITY)
Admission: EM | Admit: 2012-03-13 | Discharge: 2012-03-14 | Disposition: A | Discharge status: Home / Self Care | Payer: BC Managed Care – PPO | Attending: Emergency Medicine | Admitting: Emergency Medicine

## 2012-03-13 DIAGNOSIS — T63441A Toxic effect of venom of bees, accidental (unintentional), initial encounter: Secondary | ICD-10-CM

## 2012-03-13 DIAGNOSIS — T63461A Toxic effect of venom of wasps, accidental (unintentional), initial encounter: Secondary | ICD-10-CM | POA: Insufficient documentation

## 2012-03-13 DIAGNOSIS — T782XXA Anaphylactic shock, unspecified, initial encounter: Secondary | ICD-10-CM

## 2012-03-13 DIAGNOSIS — T6391XA Toxic effect of contact with unspecified venomous animal, accidental (unintentional), initial encounter: Secondary | ICD-10-CM | POA: Insufficient documentation

## 2012-03-13 MED ORDER — DIPHENHYDRAMINE HCL 50 MG/ML IJ SOLN: 12.5000 mg | Freq: Once | INTRAMUSCULAR | Status: DC

## 2012-03-13 MED ORDER — DIPHENHYDRAMINE HCL 25 MG PO TABS: 25.0000 mg | ORAL_TABLET | Freq: Four times a day (QID) | ORAL | Status: DC

## 2012-03-13 MED ORDER — FAMOTIDINE 20 MG PO TABS: 20.0000 mg | ORAL_TABLET | Freq: Two times a day (BID) | ORAL | Status: DC

## 2012-03-13 MED ORDER — FAMOTIDINE IN NACL 20-0.9 MG/50ML-% IV SOLN
20.0000 mg | Freq: Once | INTRAVENOUS | Status: AC
  Administered 2012-03-13: 20 mg via INTRAVENOUS
  Filled 2012-03-13: qty 50

## 2012-03-13 MED ORDER — EPINEPHRINE 0.3 MG/0.3ML IJ DEVI: 0.3000 mg | INTRAMUSCULAR | Status: DC | PRN

## 2012-03-13 MED ORDER — EPINEPHRINE HCL 1 MG/ML IJ SOLN
0.3000 mg | Freq: Once | INTRAMUSCULAR | Status: AC
  Administered 2012-03-13: 20:00:00 via INTRAMUSCULAR

## 2012-03-13 MED ORDER — PREDNISONE 10 MG PO TABS: 20.0000 mg | ORAL_TABLET | Freq: Every day | ORAL | Status: DC

## 2012-03-13 MED ORDER — METHYLPREDNISOLONE SODIUM SUCC 125 MG IJ SOLR
125.0000 mg | Freq: Once | INTRAMUSCULAR | Status: AC
  Administered 2012-03-13: 125 mg via INTRAVENOUS

## 2012-03-13 NOTE — ED Provider Notes (Signed)
History     CSN: [id number]  Arrival date & time 03/13/12  1933   First MD Initiated Contact with Patient 03/13/12 1951      Chief Complaint  Patient presents with  . Insect Bite     HPI Patient presents with less than 1 hour onset of B. or wash things on his right ankle and left shoulder.  Patient again having wheezing and feeling a lump in his throat along with a red rash.  Patient has no previous history of bee or insect allergy periods patient has limited medical history and is on no current medication other than Claritin.  Otherwise patient's good health. Past Medical History  Diagnosis Date  . Allergy     seasonal  . RAD (reactive airway disease) 1998    cold induced cough @ [facility]  . Skin cancer     Dr Karlyn Agee  . Hyperlipidemia     LDL goal = <110    Past Surgical History  Procedure Date  . Ganglionectomy     L wrist  . Tonsillectomy   . Colonoscopy 2003 & 2013    negative; Dr Jarold Motto    Family History  Problem Relation Age of Onset  . Cancer Mother      cervical lymph nodes  . Hypertension Father   . Heart disease Father     bypass surgery  . Heart attack Father 63    Colostomy for ? diagnosis  . Colon cancer Neg Hx   . Esophageal cancer Neg Hx   . Stomach cancer Neg Hx   . Rectal cancer Neg Hx     History  Substance Use Topics  . Smoking status: Never Smoker   . Smokeless tobacco: Never Used  . Alcohol Use: No     rarely      Review of Systems  All other systems reviewed and are negative.    Allergies  Tadalafil  Home Medications   Current Outpatient Rx  Name Route Sig Dispense Refill  . DIPHENHYDRAMINE HCL 25 MG PO TABS Oral Take 1 tablet (25 mg total) by mouth every 6 (six) hours. 20 tablet 0  . EPINEPHRINE 0.3 MG/0.3ML IJ DEVI Intramuscular Inject 0.3 mLs (0.3 mg total) into the muscle as needed. 2 Device 1  . FAMOTIDINE 20 MG PO TABS Oral Take 1 tablet (20 mg total) by mouth 2 (two) times daily. 10 tablet 0  . LORATADINE  10 MG PO TABS Oral Take 10 mg by mouth daily as needed. For allergies    . PREDNISONE 10 MG PO TABS Oral Take 2 tablets (20 mg total) by mouth daily. 15 tablet 0    BP 123/74  Pulse 61  Temp 97.6 F (36.4 C) (Oral)  Resp 18  SpO2 98%  Physical Exam  Nursing note and vitals reviewed. Constitutional: He is oriented to person, place, and time. He appears well-developed. No distress.  HENT:  Head: Normocephalic and atraumatic.       No stridor but patient does have some hoarseness to his voice.  Eyes: Pupils are equal, round, and reactive to light.  Neck: Normal range of motion.  Cardiovascular: Normal rate and intact distal pulses.   Pulmonary/Chest: No respiratory distress. He has wheezes.  Abdominal: Normal appearance. He exhibits no distension.  Musculoskeletal: Normal range of motion.  Neurological: He is alert and oriented to person, place, and time. No cranial nerve deficit.  Skin: Skin is warm and dry. No rash noted.  Psychiatric: He  has a normal mood and affect. His behavior is normal.    ED Course  Procedures (including critical care time)  Scheduled Meds:   Continuous Infusions:   PRN Meds:.    CRITICAL CARE Performed by: Nelia Shi   Total critical care time: 45 min  Critical care time was exclusive of separately billable procedures and treating other patients.  Critical care was necessary to treat or prevent imminent or life-threatening deterioration.  Critical care was time spent personally by me on the following activities: development of treatment plan with patient and/or surrogate as well as nursing, discussions with consultants, evaluation of patient's response to treatment, examination of patient, obtaining history from patient or surrogate, ordering and performing treatments and interventions, ordering and review of laboratory studies, ordering and review of radiographic studies, pulse oximetry and re-evaluation of patient's condition.  Labs  Reviewed - No data to display No results found.   1. Anaphylaxis   2. Bee sting       MDM   Following initial treatment for anaphylaxis patient's symptoms improved dramatically.  Plan at this time is to observe the patient 6-8 hours in CDU and discharged with EpiPen, Benadryl prednisone and Pepcid.       Nelia Shi, MD 03/16/12 2252

## 2012-03-13 NOTE — ED Notes (Signed)
PT. PRESENTS WITH GENERALIZED REDDNESS , STUNG BY A BEE AT LEFT ANKLE , LEFT SHOULDER AND BACK , RESPIRATIONS UNLABORED , AIRWAY INTACT .

## 2012-03-13 NOTE — ED Notes (Signed)
BENADRYL 25 MG IV GIVEN BY C. CHRISCO RN PRIOR TO PT'S. REGISTRATION .

## 2012-03-13 NOTE — ED Notes (Signed)
REPORT GIVEN TO LANA RN AT CDU , TRANSFERRED TO CDU 4 IN STABLE CONDITION .

## 2012-04-01 ENCOUNTER — Encounter (HOSPITAL_COMMUNITY): Payer: Self-pay

## 2012-04-01 ENCOUNTER — Emergency Department (HOSPITAL_COMMUNITY)
Admission: EM | Admit: 2012-04-01 | Discharge: 2012-04-02 | Disposition: A | Discharge status: Home / Self Care | Payer: BC Managed Care – PPO | Attending: Emergency Medicine | Admitting: Emergency Medicine

## 2012-04-01 DIAGNOSIS — W57XXXA Bitten or stung by nonvenomous insect and other nonvenomous arthropods, initial encounter: Secondary | ICD-10-CM

## 2012-04-01 DIAGNOSIS — I10 Essential (primary) hypertension: Secondary | ICD-10-CM | POA: Insufficient documentation

## 2012-04-01 DIAGNOSIS — T148 Other injury of unspecified body region: Secondary | ICD-10-CM | POA: Insufficient documentation

## 2012-04-01 DIAGNOSIS — J45909 Unspecified asthma, uncomplicated: Secondary | ICD-10-CM | POA: Insufficient documentation

## 2012-04-01 DIAGNOSIS — E785 Hyperlipidemia, unspecified: Secondary | ICD-10-CM | POA: Insufficient documentation

## 2012-04-01 DIAGNOSIS — Z91038 Other insect allergy status: Secondary | ICD-10-CM | POA: Insufficient documentation

## 2012-04-01 DIAGNOSIS — T7840XA Allergy, unspecified, initial encounter: Secondary | ICD-10-CM

## 2012-04-01 MED ORDER — FAMOTIDINE 20 MG PO TABS: 20.0000 mg | ORAL_TABLET | Freq: Two times a day (BID) | ORAL | Status: DC

## 2012-04-01 MED ORDER — DIPHENHYDRAMINE HCL 25 MG PO TABS: 25.0000 mg | ORAL_TABLET | Freq: Four times a day (QID) | ORAL | Status: AC

## 2012-04-01 MED ORDER — DIPHENHYDRAMINE HCL 25 MG PO CAPS
25.0000 mg | ORAL_CAPSULE | Freq: Once | ORAL | Status: AC
  Administered 2012-04-01: 25 mg via ORAL
  Filled 2012-04-01: qty 1

## 2012-04-01 MED ORDER — PREDNISONE 50 MG PO TABS: 50.0000 mg | ORAL_TABLET | Freq: Every day | ORAL | Status: DC

## 2012-04-01 MED ORDER — PREDNISONE 20 MG PO TABS
60.0000 mg | ORAL_TABLET | Freq: Once | ORAL | Status: AC
  Administered 2012-04-01: 60 mg via ORAL
  Filled 2012-04-01: qty 3

## 2012-04-01 MED ORDER — FAMOTIDINE 20 MG PO TABS
10.0000 mg | ORAL_TABLET | Freq: Once | ORAL | Status: AC
  Administered 2012-04-01: 10 mg via ORAL
  Filled 2012-04-01: qty 1

## 2012-04-01 NOTE — ED Notes (Signed)
PT HERE FOR BITES TO SKIN NOTICED TWO YESTERDAY AND TODAY HAD 18, PT STS HE WAS MOWING THE YARD AND FELT SOMETHING IN HIS SHIRT, BUMPS ARE PUSTULE LIKE AND RED. STS ITCHING, RECENTLY ADMITTED FOR BEE STINGS RXN AND STS HE MAY BE A LITTLE SOB BUT UNABLE TO REALLY TELL. NAD NOTED.

## 2012-04-01 NOTE — ED Notes (Signed)
Pt requesting update and MD made aware.

## 2012-04-06 NOTE — ED Provider Notes (Signed)
History     CSN: [id number]  Arrival date & time 04/01/12  2841   First MD Initiated Contact with Patient 04/01/12 0848      Chief Complaint  Patient presents with  . Insect Bite    (Consider location/radiation/quality/duration/timing/severity/associated sxs/prior treatment) HPI Comments: Pt comes in w/ cc of allergic rxn. Pt had a bee sting few days back, he was treated with allergic rxn meds and sent home. Today, he notices worsening lesion all over his torso. He is unsure what the etiology, but was instructed to come to the ER. No respiratory distress, no wheezing, and no swelling in the oral mucosa. Pt states he saw a tick on his car recently, but cant recall being bitten. There is no headaches, myalgias, lethargy, spasms, fevers, chills.  The history is provided by the patient.    Past Medical History  Diagnosis Date  . Allergy     seasonal  . RAD (reactive airway disease) 1998    cold induced cough @ [facility]  . Skin cancer     Dr Karlyn Agee  . Hyperlipidemia     LDL goal = <110    Past Surgical History  Procedure Date  . Ganglionectomy     L wrist  . Tonsillectomy   . Colonoscopy 2003 & 2013    negative; Dr Jarold Motto    Family History  Problem Relation Age of Onset  . Cancer Mother      cervical lymph nodes  . Hypertension Father   . Heart disease Father     bypass surgery  . Heart attack Father 31    Colostomy for ? diagnosis  . Colon cancer Neg Hx   . Esophageal cancer Neg Hx   . Stomach cancer Neg Hx   . Rectal cancer Neg Hx     History  Substance Use Topics  . Smoking status: Never Smoker   . Smokeless tobacco: Never Used  . Alcohol Use: No     rarely      Review of Systems  Constitutional: Negative for activity change and appetite change.  Respiratory: Negative for cough and shortness of breath.   Cardiovascular: Negative for chest pain.  Gastrointestinal: Negative for abdominal pain.  Genitourinary: Negative for dysuria.  Skin:  Positive for color change and rash.    Allergies  Bee venom  Home Medications   Current Outpatient Rx  Name Route Sig Dispense Refill  . EPINEPHRINE 0.3 MG/0.3ML IJ DEVI Intramuscular Inject 0.3 mg into the muscle once as needed. For severe allergic reaction    . LORATADINE 10 MG PO TABS Oral Take 10 mg by mouth daily as needed. For allergies    . DIPHENHYDRAMINE HCL 25 MG PO TABS Oral Take 1 tablet (25 mg total) by mouth every 6 (six) hours. 20 tablet 0  . FAMOTIDINE 20 MG PO TABS Oral Take 1 tablet (20 mg total) by mouth 2 (two) times daily. 30 tablet 0  . PREDNISONE 50 MG PO TABS Oral Take 1 tablet (50 mg total) by mouth daily. 5 tablet 0    BP 133/85  Pulse 70  Temp 97 F (36.1 C) (Oral)  Resp 18  SpO2 95%  Physical Exam  Constitutional: He is oriented to person, place, and time. He appears well-developed.  HENT:  Head: Normocephalic and atraumatic.       Mucosal exam is normal.  Eyes: Conjunctivae and EOM are normal. Pupils are equal, round, and reactive to light.  Neck: Normal range of  motion. Neck supple.  Cardiovascular: Normal rate and regular rhythm.   Pulmonary/Chest: Effort normal and breath sounds normal.  Abdominal: Soft. Bowel sounds are normal. He exhibits no distension. There is no tenderness. There is no rebound and no guarding.  Neurological: He is alert and oriented to person, place, and time.  Skin: Skin is warm. Rash noted.       Several maculopapular lesion. No erythema multiform, no vesicles, no pustiles. There is some blanching diffusely.     ED Course  Procedures (including critical care time)  Labs Reviewed - No data to display No results found.   1. Allergic reaction   2. Insect bite       MDM  DDX: Allergic skin reaction Tick bite Cellulitis  Pt with diffse maculopapular lesions, with some skin erythema - exam consistent with allergic rxn. No tick bites, no constitutionals consistent with a tick bite. Pt has a good pcp follow  up - and we dont see any reason to start him on empiric treatment with doxy at this time.         Derwood Kaplan, MD 04/06/12 [phone]

## 2012-07-07 ENCOUNTER — Telehealth: Payer: Self-pay | Admitting: Internal Medicine

## 2012-07-07 DIAGNOSIS — E785 Hyperlipidemia, unspecified: Secondary | ICD-10-CM

## 2012-07-07 DIAGNOSIS — Z0000 Encounter for general adult medical examination without abnormal findings: Secondary | ICD-10-CM

## 2012-07-07 NOTE — Telephone Encounter (Signed)
pt has cpe scheduled for 5.12.14, but due to his insurance he states he must come prior to CPE to have labs done, please review and put in orders in approved

## 2012-07-07 NOTE — Telephone Encounter (Signed)
Orders placed.

## 2012-12-22 ENCOUNTER — Other Ambulatory Visit (INDEPENDENT_AMBULATORY_CARE_PROVIDER_SITE_OTHER): Payer: BC Managed Care – PPO

## 2012-12-22 DIAGNOSIS — Z0000 Encounter for general adult medical examination without abnormal findings: Secondary | ICD-10-CM

## 2012-12-22 DIAGNOSIS — E785 Hyperlipidemia, unspecified: Secondary | ICD-10-CM

## 2012-12-22 LAB — CBC WITH DIFFERENTIAL/PLATELET
Basophils Absolute: 0 10*3/uL (ref 0.0–0.1)
Lymphocytes Relative: 20.7 % (ref 12.0–46.0)
Lymphs Abs: 1.3 10*3/uL (ref 0.7–4.0)
Monocytes Relative: 6.9 % (ref 3.0–12.0)
Neutrophils Relative %: 70.2 % (ref 43.0–77.0)
Platelets: 185 10*3/uL (ref 150.0–400.0)
RDW: 13.8 % (ref 11.5–14.6)

## 2012-12-23 LAB — BASIC METABOLIC PANEL
BUN: 20 mg/dL (ref 6–23)
Calcium: 9 mg/dL (ref 8.4–10.5)
Creatinine, Ser: 1.1 mg/dL (ref 0.4–1.5)
GFR: 75.09 mL/min (ref >60.00)
Glucose, Bld: 93 mg/dL (ref 70–99)

## 2012-12-23 LAB — HEPATIC FUNCTION PANEL
ALT: 25 U/L (ref 0–53)
AST: 25 U/L (ref 0–37)
Bilirubin, Direct: 0 mg/dL (ref 0.0–0.3)
Total Bilirubin: 0.7 mg/dL (ref 0.3–1.2)
Total Protein: 6.8 g/dL (ref 6.0–8.3)

## 2012-12-23 LAB — LIPID PANEL: Cholesterol: 186 mg/dL (ref 0–200)

## 2012-12-23 LAB — TSH: TSH: 3.01 u[IU]/mL (ref 0.35–5.50)

## 2012-12-29 ENCOUNTER — Encounter: Payer: Self-pay | Admitting: Internal Medicine

## 2012-12-29 ENCOUNTER — Ambulatory Visit (INDEPENDENT_AMBULATORY_CARE_PROVIDER_SITE_OTHER): Payer: BC Managed Care – PPO | Admitting: Internal Medicine

## 2012-12-29 VITALS — BP 116/78 | HR 65 | Temp 97.8°F | Resp 12 | Ht 67.03 in | Wt 177.6 lb

## 2012-12-29 DIAGNOSIS — Z0000 Encounter for general adult medical examination without abnormal findings: Secondary | ICD-10-CM

## 2012-12-29 DIAGNOSIS — E785 Hyperlipidemia, unspecified: Secondary | ICD-10-CM

## 2012-12-29 DIAGNOSIS — J309 Allergic rhinitis, unspecified: Secondary | ICD-10-CM

## 2012-12-29 NOTE — Patient Instructions (Addendum)

## 2012-12-29 NOTE — Progress Notes (Signed)
  Subjective:    Patient ID: Dale Gonzalez, male    DOB: October 29, 1950, 62 y.o.   MRN: [id number]  HPI [name] is here for a physical; he denies acute issues.     Review of Systems He is on a heart healthy diet; he exercises 60-120 minutes 6 times per week without symptoms. Specifically he denies chest pain, palpitations, dyspnea, or claudication. Family history is negative for premature coronary disease; his father smoked & had MI @ 23. Advanced cholesterol testing reveals his LDL goal is less than 115.     Objective:   Physical Exam Gen.: Healthy and well-nourished in appearance. Alert, appropriate and cooperative throughout exam. Head: Normocephalic without obvious abnormalities; no alopecia  Eyes: No corneal or conjunctival inflammation noted. Pupils equal round reactive to light and accommodation. Fundal exam is benign without hemorrhages, exudate, papilledema. Extraocular motion intact. Vision grossly normal without lenses Ears: External  ear exam reveals no significant lesions or deformities. Canals clear .TMs normal. Hearing is grossly normal bilaterally. Nose: External nasal exam reveals no deformity or inflammation. Nasal mucosa are pink and moist. No lesions or exudates noted. Septum deviated slightly to R  Mouth: Oral mucosa and oropharynx reveal no lesions or exudates. Teeth in good repair. Neck: No deformities, masses, or tenderness noted. Range of motion & Thyroid normal. Lungs: Normal respiratory effort; chest expands symmetrically. Lungs are clear to auscultation without rales, wheezes, or increased work of breathing. Heart: Normal rate and rhythm. Normal S1 and S2. No gallop, click, or rub.S4 w/o murmur. Abdomen: Bowel sounds normal; abdomen soft and nontender. No masses, organomegaly or hernias noted. Genitalia: Genitalia normal except for small left varices. Prostate is normal without enlargement, asymmetry, nodularity, or induration.             Musculoskeletal/extremities: No  deformity or scoliosis noted of  the thoracic or lumbar spine.  No clubbing, cyanosis, edema, or significant extremity  deformity noted. Range of motion normal .Tone & strength  Normal. Joints normal . Nail health good. Able to lie down & sit up w/o help. Negative SLR bilaterally Vascular: Carotid, radial artery, dorsalis pedis and  posterior tibial pulses are full and equal. No bruits present. Neurologic: Alert and oriented x3. Deep tendon reflexes symmetrical and normal.        Skin: Intact without suspicious lesions or rashes. Lymph: No cervical, axillary, or inguinal lymphadenopathy present. Psych: Mood and affect are normal. Normally interactive                                                                                        Assessment & Plan:  #1 comprehensive physical exam; no acute findings  Plan: see Orders  & Recommendations

## 2013-06-29 ENCOUNTER — Telehealth: Payer: Self-pay | Admitting: *Deleted

## 2013-06-29 NOTE — Telephone Encounter (Signed)
Received message from triage line from patient regarding a sample request. Patient did not state the name of the medication. Phone call returned and message was left to ask patient to please call back with the medication name.

## 2013-07-24 ENCOUNTER — Encounter: Payer: Self-pay | Admitting: Internal Medicine

## 2013-07-24 ENCOUNTER — Ambulatory Visit (INDEPENDENT_AMBULATORY_CARE_PROVIDER_SITE_OTHER): Payer: BC Managed Care – PPO | Admitting: Internal Medicine

## 2013-07-24 VITALS — BP 141/83 | HR 69 | Temp 98.4°F | Ht 68.25 in | Wt 177.8 lb

## 2013-07-24 DIAGNOSIS — J683 Other acute and subacute respiratory conditions due to chemicals, gases, fumes and vapors: Secondary | ICD-10-CM

## 2013-07-24 DIAGNOSIS — R05 Cough: Secondary | ICD-10-CM

## 2013-07-24 DIAGNOSIS — J45909 Unspecified asthma, uncomplicated: Secondary | ICD-10-CM

## 2013-07-24 DIAGNOSIS — J45990 Exercise induced bronchospasm: Secondary | ICD-10-CM | POA: Insufficient documentation

## 2013-07-24 MED ORDER — ALBUTEROL SULFATE HFA 108 (90 BASE) MCG/ACT IN AERS: INHALATION_SPRAY | RESPIRATORY_TRACT | Status: DC

## 2013-07-24 MED ORDER — MONTELUKAST SODIUM 10 MG PO TABS: 10.0000 mg | ORAL_TABLET | Freq: Every day | ORAL | Status: DC

## 2013-07-24 MED ORDER — EPINEPHRINE 0.3 MG/0.3ML IJ SOAJ: 0.3000 mg | Freq: Once | INTRAMUSCULAR | Status: DC

## 2013-07-24 NOTE — Patient Instructions (Signed)
Stretching and warming up prior to exercise will allow the airways to acclimate. Continue to use albuterol one spray 30 minutes prior and one puff 15 minutes prior to exercise. Take the Singulair 10 mg the evening prior to the exercise program if needed

## 2013-07-24 NOTE — Progress Notes (Signed)
   Subjective:    Patient ID: Dale Gonzalez, male    DOB: 25-Jul-1951, 62 y.o.   MRN: [id number]  HPI   Symptoms began approximately one month ago as a dry cough exacerbated by exercise or exposure to cold air.  This has been phenomena at this time the urine the past. The original onset was in 1998 while backpacking in [place].  He has no definite history of asthma.  Symbicort has been of benefit past for the symptoms. He has never smoked; he is not on ACE inhibitor.    Review of Systems  He denies extrinsic symptoms of itchy, watery eyes or sneezing. He's had no constitutional symptoms of fever, chills, or sweats. There are no symptoms of rhinosinusitis. He's had no pleuritic pain but deep breathing does initiate the cough. There's been no associated shortness of breath or wheezing.  He has no significant dyspepsia or reflux symptoms.     Objective:   Physical Exam General appearance:good health ;well nourished; no acute distress or increased work of breathing is present.  No  lymphadenopathy about the head, neck, or axilla noted.   Eyes: No conjunctival inflammation or lid edema is present.   Ears:  External ear exam shows no significant lesions or deformities.  Otoscopic examination reveals clear canals, tympanic membranes are intact bilaterally without bulging, retraction, inflammation or discharge.  Nose:  External nasal examination shows no deformity or inflammation. Nasal mucosa are pink and moist without lesions or exudates. No septal dislocation or deviation.No obstruction to airflow.   Oral exam: Dental hygiene is good; lips and gums are healthy appearing.There is no oropharyngeal erythema or exudate noted.   Neck:  No deformities,  masses, or tenderness noted.     Heart:  Normal rate and regular rhythm. S1 and S2 normal without gallop, murmur, click, rub or other extra sounds.   Lungs:Chest clear to auscultation; no wheezes, rhonchi,rales ,or rubs  present.No increased work of breathing.  Deep breathing does induce a paroxysmal cough  Extremities:  No cyanosis, edema, or clubbing  noted    Skin: Warm & dry          Assessment & Plan:  #1 exercise-induced bronchospasm with cold air is a major trigger  Plan: See orders

## 2013-07-24 NOTE — Progress Notes (Signed)
Pre visit review using our clinic review tool, if applicable. No additional management support is needed unless otherwise documented below in the visit note. 

## 2013-08-17 ENCOUNTER — Ambulatory Visit: Payer: BC Managed Care – PPO | Admitting: Internal Medicine

## 2013-08-17 ENCOUNTER — Encounter: Payer: Self-pay | Admitting: Internal Medicine

## 2013-08-17 ENCOUNTER — Ambulatory Visit (INDEPENDENT_AMBULATORY_CARE_PROVIDER_SITE_OTHER)
Admission: RE | Admit: 2013-08-17 | Discharge: 2013-08-17 | Disposition: A | Discharge status: Home / Self Care | Payer: BC Managed Care – PPO | Source: Ambulatory Visit | Attending: Internal Medicine | Admitting: Internal Medicine

## 2013-08-17 ENCOUNTER — Telehealth: Payer: Self-pay | Admitting: *Deleted

## 2013-08-17 ENCOUNTER — Ambulatory Visit (INDEPENDENT_AMBULATORY_CARE_PROVIDER_SITE_OTHER): Payer: BC Managed Care – PPO | Admitting: Internal Medicine

## 2013-08-17 VITALS — BP 134/77 | HR 71 | Temp 98.6°F | Resp 16 | Wt 169.0 lb

## 2013-08-17 DIAGNOSIS — J45901 Unspecified asthma with (acute) exacerbation: Secondary | ICD-10-CM

## 2013-08-17 DIAGNOSIS — J4551 Severe persistent asthma with (acute) exacerbation: Secondary | ICD-10-CM

## 2013-08-17 DIAGNOSIS — J45909 Unspecified asthma, uncomplicated: Secondary | ICD-10-CM

## 2013-08-17 DIAGNOSIS — J683 Other acute and subacute respiratory conditions due to chemicals, gases, fumes and vapors: Secondary | ICD-10-CM

## 2013-08-17 MED ORDER — AZITHROMYCIN 250 MG PO TABS: ORAL_TABLET | ORAL | Status: DC

## 2013-08-17 MED ORDER — HYDROCODONE-HOMATROPINE 5-1.5 MG/5ML PO SYRP: 5.0000 mL | ORAL_SOLUTION | Freq: Four times a day (QID) | ORAL | Status: DC | PRN

## 2013-08-17 MED ORDER — MONTELUKAST SODIUM 10 MG PO TABS: 10.0000 mg | ORAL_TABLET | Freq: Every day | ORAL | Status: DC

## 2013-08-17 MED ORDER — PREDNISONE 20 MG PO TABS: 20.0000 mg | ORAL_TABLET | Freq: Two times a day (BID) | ORAL | Status: DC

## 2013-08-17 NOTE — Patient Instructions (Signed)
Order for x-rays entered into  the computer; these will be performed at [address] across from [facility]. No appointment is necessary. 

## 2013-08-17 NOTE — Telephone Encounter (Signed)
08/17/2013 Pt came in this morning with asthma and cold symptoms. Stated he has been having these symptoms since Friday and was waiting to see Alwyn Ren on Monday (today).  Pt was advised to go to Urgent care since having shortness of breath, but refused stating he wanted to see Alwyn Ren because Alwyn Ren knows his history. First available appt is today at 1.  Pt scheduled appt for 1, but was advised again if symptoms got worse before his appt to go straight to urgent care.

## 2013-08-17 NOTE — Progress Notes (Signed)
   Subjective:    Patient ID: Dale Gonzalez, male    DOB: May 14, 1951, 62 y.o.   MRN: [id number]  HPI When last seen he was exhibiting symptoms of exercise-induced bronchospasm which didn't respond initially to preexercise warm up and use of an inhaler. The wheezing and cough have returned with a vengeance. He still feels the only triggers are cold air &  exercise. He has 2 grandchildren; one of them is an infant any other newborn; neither are ill.  Despite using Symbicort 2 puffs every 12 hours he has relief less than 30 minutes.  His past history was reviewed; he had no history of asthma as a child. The events began after [place] air [place].     Review of Systems He specifically denies frontal sinus pain, facial pain, nasal purulence, dental pain, sore throat, otic pain, otic discharge  He has no extrinsic symptoms at this time of itchy, watery eyes, sneezing despite a past history of seasonal allergies only to pollen.  He has minimal sputum production; the description suggests person spirals. He has no fever, chills, or sweats.      Objective:   Physical Exam General appearance:good health ;well nourished; no acute distress or increased work of breathing is present.  No  lymphadenopathy about the head, neck, or axilla noted.   Eyes: No conjunctival inflammation or lid edema is present.   Ears:  External ear exam shows no significant lesions or deformities.  Otoscopic examination reveals clear canals, tympanic membranes are intact bilaterally without bulging, retraction, inflammation or discharge.  Nose:  External nasal examination shows no deformity or inflammation. Nasal mucosa are pink and moist without lesions or exudates. No septal dislocation or deviation.No obstruction to airflow.   Oral exam: Dental hygiene is good; lips and gums are healthy appearing.There is no oropharyngeal erythema or exudate noted. Hoarse  Neck:  No deformities, masses, or  tenderness noted.     Heart:  Normal rate and regular rhythm. S1 and S2 normal without gallop, murmur, click, rub or other extra sounds.   Lungs: He has paroxysms of cough. Scattered low-grade wheezing and rales are present .No increased work of breathing.    Extremities:  No cyanosis, edema, or clubbing  noted    Skin: Warm but damp.         Assessment & Plan:  #1 reactive airways disease presenting as severe, persistent asthma picture.  See orders

## 2013-09-15 ENCOUNTER — Other Ambulatory Visit: Payer: Self-pay | Admitting: *Deleted

## 2013-09-15 ENCOUNTER — Telehealth: Payer: Self-pay | Admitting: Internal Medicine

## 2013-09-15 DIAGNOSIS — Z0000 Encounter for general adult medical examination without abnormal findings: Secondary | ICD-10-CM

## 2013-09-15 NOTE — Telephone Encounter (Signed)
V70.0  Please  schedule fasting Labs : BMET,Lipids, hepatic panel, CBC & dif, TSH.

## 2013-09-15 NOTE — Telephone Encounter (Signed)
Patient would like to have labs prior to his cpe on 12/31/13 at [facility] location. Please advise if okay and place orders.

## 2013-09-15 NOTE — Telephone Encounter (Signed)
Please advise 

## 2013-09-16 NOTE — Telephone Encounter (Signed)
Future lab ordered and sent.  Pt aware.//AB/CMA

## 2013-11-25 ENCOUNTER — Encounter: Payer: Self-pay | Admitting: Internal Medicine

## 2013-11-27 MED ORDER — EPINEPHRINE 0.3 MG/0.3ML IJ SOAJ: 0.3000 mg | Freq: Once | INTRAMUSCULAR | Status: DC

## 2013-11-27 NOTE — Telephone Encounter (Signed)
Rx phoned in ([place]) to the pharmacy.//AB/CMA

## 2013-12-29 ENCOUNTER — Other Ambulatory Visit (INDEPENDENT_AMBULATORY_CARE_PROVIDER_SITE_OTHER): Payer: BC Managed Care – PPO

## 2013-12-29 DIAGNOSIS — Z0000 Encounter for general adult medical examination without abnormal findings: Secondary | ICD-10-CM

## 2013-12-29 LAB — CBC WITH DIFFERENTIAL/PLATELET
BASOS PCT: 0.5 % (ref 0.0–3.0)
Basophils Absolute: 0 10*3/uL (ref 0.0–0.1)
EOS ABS: 0.2 10*3/uL (ref 0.0–0.7)
EOS PCT: 3.5 % (ref 0.0–5.0)
HEMATOCRIT: 45.5 % (ref 39.0–52.0)
Hemoglobin: 15.6 g/dL (ref 13.0–17.0)
LYMPHS ABS: 1.2 10*3/uL (ref 0.7–4.0)
Lymphocytes Relative: 23.2 % (ref 12.0–46.0)
MCHC: 34.3 g/dL (ref 30.0–36.0)
MCV: 89.3 fl (ref 78.0–100.0)
MONO ABS: 0.5 10*3/uL (ref 0.1–1.0)
Monocytes Relative: 9.6 % (ref 3.0–12.0)
NEUTROS ABS: 3.4 10*3/uL (ref 1.4–7.7)
NEUTROS PCT: 63.2 % (ref 43.0–77.0)
Platelets: 209 10*3/uL (ref 150.0–400.0)
RBC: 5.09 Mil/uL (ref 4.22–5.81)
RDW: 13.5 % (ref 11.5–15.5)
WBC: 5.3 10*3/uL (ref 4.0–10.5)

## 2013-12-29 LAB — BASIC METABOLIC PANEL
BUN: 19 mg/dL (ref 6–23)
CALCIUM: 9.4 mg/dL (ref 8.4–10.5)
CO2: 30 meq/L (ref 19–32)
Chloride: 105 mEq/L (ref 96–112)
Creatinine, Ser: 1.1 mg/dL (ref 0.4–1.5)
GFR: 74.85 mL/min (ref >60.00)
GLUCOSE: 90 mg/dL (ref 70–99)
Potassium: 4.6 mEq/L (ref 3.5–5.1)
Sodium: 140 mEq/L (ref 135–145)

## 2013-12-29 LAB — HEPATIC FUNCTION PANEL
ALT: 21 U/L (ref 0–53)
AST: 27 U/L (ref 0–37)
Albumin: 4.2 g/dL (ref 3.5–5.2)
Alkaline Phosphatase: 46 U/L (ref 39–117)
BILIRUBIN DIRECT: 0.1 mg/dL (ref 0.0–0.3)
BILIRUBIN TOTAL: 0.8 mg/dL (ref 0.2–1.2)
Total Protein: 6.7 g/dL (ref 6.0–8.3)

## 2013-12-29 LAB — LIPID PANEL
CHOL/HDL RATIO: 6
Cholesterol: 212 mg/dL — ABNORMAL HIGH (ref 0–200)
HDL: 34.4 mg/dL — ABNORMAL LOW (ref >39.00)
LDL Cholesterol: 148 mg/dL — ABNORMAL HIGH (ref 0–99)
Triglycerides: 150 mg/dL — ABNORMAL HIGH (ref 0.0–149.0)
VLDL: 30 mg/dL (ref 0.0–40.0)

## 2013-12-29 LAB — TSH: TSH: 4.52 u[IU]/mL — ABNORMAL HIGH (ref 0.35–4.50)

## 2013-12-31 ENCOUNTER — Encounter: Payer: BC Managed Care – PPO | Admitting: Internal Medicine

## 2014-01-07 ENCOUNTER — Encounter: Payer: BC Managed Care – PPO | Admitting: Internal Medicine

## 2014-01-12 ENCOUNTER — Encounter: Payer: Self-pay | Admitting: Internal Medicine

## 2014-01-12 ENCOUNTER — Ambulatory Visit (INDEPENDENT_AMBULATORY_CARE_PROVIDER_SITE_OTHER): Payer: BC Managed Care – PPO | Admitting: Internal Medicine

## 2014-01-12 VITALS — BP 142/88 | HR 79 | Temp 98.8°F | Resp 12 | Ht 67.0 in | Wt 172.8 lb

## 2014-01-12 DIAGNOSIS — E785 Hyperlipidemia, unspecified: Secondary | ICD-10-CM

## 2014-01-12 DIAGNOSIS — N429 Disorder of prostate, unspecified: Secondary | ICD-10-CM

## 2014-01-12 DIAGNOSIS — J683 Other acute and subacute respiratory conditions due to chemicals, gases, fumes and vapors: Secondary | ICD-10-CM

## 2014-01-12 DIAGNOSIS — R946 Abnormal results of thyroid function studies: Secondary | ICD-10-CM | POA: Insufficient documentation

## 2014-01-12 DIAGNOSIS — Z0000 Encounter for general adult medical examination without abnormal findings: Secondary | ICD-10-CM

## 2014-01-12 DIAGNOSIS — J45909 Unspecified asthma, uncomplicated: Secondary | ICD-10-CM

## 2014-01-12 NOTE — Patient Instructions (Signed)
Plain Mucinex (NOT D) for thick secretions ;force NON dairy fluids .   Nasal cleansing in the shower as discussed with lather of mild shampoo.After 10 seconds wash off lather while  exhaling through nostrils. Make sure that all residual soap is removed to prevent irritation.  Flonase OR Nasacort AQ 1 spray in each nostril twice a day as needed. Use the "crossover" technique into opposite nostril spraying toward opposite ear @ 45 degree angle, not straight up into nostril.  Use a Neti pot daily only  as needed for significant sinus congestion; going from open side to congested side . Plain Allegra (NOT D )  160 daily , Loratidine 10 mg , OR Zyrtec 10 mg @ bedtime  as needed for itchy eyes & sneezing.  Symbicort one - two inhalations every 12 hours; gargle and spit after use.  Please review Dr Nunzio Cory book Eat, [place] for best  dietary cholesterol information. Please have fasting Labs drawn by 05/04/14  ; orders entered

## 2014-01-12 NOTE — Progress Notes (Signed)
Pre visit review using our clinic review tool, if applicable. No additional management support is needed unless otherwise documented below in the visit note. 

## 2014-01-12 NOTE — Progress Notes (Signed)
Subjective:    Patient ID: Dale Gonzalez, male    DOB: 03-10-1951, 63 y.o.   MRN: [id number]  HPI  [name] is here for a physical;acute issues  Include RAD symptoms.     Review of Systems  Last week he had respiratory tract infection symptoms with paroxysmal cough. He has had recurrent reactive airways symptoms over the last year. He is not using his generic Sigulair. He has used albuterol as needed with good response. He's also used it preexercise with some abatement of the exercise-induced bronchospasm symptoms. 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 vent has helped.  He now has 3 infant grandchildren; they have been ill with respiratory tract infections intermittently.  See LDL elevation; "incfreased butter in diet".     Objective:   Physical Exam Gen.: Healthy , toned and well-nourished in appearance. Alert, appropriate and cooperative throughout exam. Appears younger than stated age  Head: Normocephalic without obvious abnormalities; no alopecia  Eyes: No corneal or conjunctival inflammation noted. Pupils equal round reactive to light and accommodation. Extraocular motion intact. Ears: External  ear exam reveals no significant lesions or deformities. Canals clear .TMs normal. Hearing is grossly normal bilaterally. Nose: External nasal exam reveals no deformity or inflammation. Nasal mucosa are pink and moist. No lesions or exudates noted.   Mouth: Oral mucosa and oropharynx reveal no lesions or exudates. Teeth in good repair. Neck: No deformities, masses, or tenderness noted. Range of motion & Thyroid normal. Lungs: Normal respiratory effort; chest expands symmetrically. Lungs are clear to auscultation without rales, wheezes, or increased work of breathing. Heart: Normal rate and rhythm. Normal S1 and S2. No gallop, click, or rub.Grade 1/2 systolic murmur. Abdomen: Bowel sounds  normal; abdomen soft and nontender. No masses, organomegaly or hernias noted. Genitalia: Genitalia normal except for left varices. Prostate:slight asymmetry; R lobe > L w/o nodularity or induration                                   Musculoskeletal/extremities: No deformity or scoliosis noted of  the thoracic or lumbar spine.  No clubbing, cyanosis, edema, or significant extremity  deformity noted. Range of motion normal .Tone & strength normal. Hand joints normal  Fingernail health good. Able to lie down & sit up w/o help. Negative SLR bilaterally Vascular: Carotid, radial artery, dorsalis pedis and  posterior tibial pulses are full and equal. No bruits present. Neurologic: Alert and oriented x3. Deep tendon reflexes symmetrical and normal.  Gait normal  including heel & toe walking . Rhomberg & finger to nose       Skin: Intact without suspicious lesions or rashes. Lymph: No cervical, axillary, or inguinal lymphadenopathy present. Psych: Mood and affect are normal. Normally interactive                                                                                        Assessment & Plan:  #1 comprehensive physical exam; no acute findings #2 RAD #3 elevated LDL #4  high normal TSH  Plan: see Orders  & Recommendations

## 2014-02-14 ENCOUNTER — Encounter: Payer: Self-pay | Admitting: Internal Medicine

## 2014-02-16 ENCOUNTER — Encounter: Payer: Self-pay | Admitting: Internal Medicine

## 2014-02-18 ENCOUNTER — Ambulatory Visit (INDEPENDENT_AMBULATORY_CARE_PROVIDER_SITE_OTHER): Payer: BC Managed Care – PPO | Admitting: *Deleted

## 2014-02-18 DIAGNOSIS — Z23 Encounter for immunization: Secondary | ICD-10-CM

## 2014-05-18 ENCOUNTER — Ambulatory Visit (INDEPENDENT_AMBULATORY_CARE_PROVIDER_SITE_OTHER): Payer: BC Managed Care – PPO | Admitting: Internal Medicine

## 2014-05-18 ENCOUNTER — Encounter: Payer: Self-pay | Admitting: Internal Medicine

## 2014-05-18 VITALS — BP 134/86 | HR 67 | Temp 98.2°F | Resp 12 | Wt 182.1 lb

## 2014-05-18 DIAGNOSIS — R030 Elevated blood-pressure reading, without diagnosis of hypertension: Secondary | ICD-10-CM | POA: Insufficient documentation

## 2014-05-18 DIAGNOSIS — Z23 Encounter for immunization: Secondary | ICD-10-CM

## 2014-05-18 DIAGNOSIS — E785 Hyperlipidemia, unspecified: Secondary | ICD-10-CM

## 2014-05-18 MED ORDER — METOPROLOL TARTRATE 25 MG PO TABS: 25.0000 mg | ORAL_TABLET | Freq: Two times a day (BID) | ORAL | Status: DC

## 2014-05-18 NOTE — Progress Notes (Signed)
   Subjective:    Patient ID: Dale Gonzalez, male    DOB: [birth year], 63 y.o.   MRN: [id number]  HPI   His blood pressures have been elevated in at church and at the pharmacy. At the pharmacy ranges are 140 to 150s. 05/16/14 his blood pressure was 152/94 &  pulse 90. He has noted some increase in his pulse upon awakening intermittently.  Recently he was [place] &  he noticed edema of the hands after walking for 15-20 minutes. Previously he noted this only engaged in marathons after several hours.  He feels that stress may be a major component. He is retired. 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  He's also concerned about his elevated cholesterol. In May of this year his LDL was 140. Based on the advanced cholesterol testing in 2005 his LDL goal should be less than 115 and ideally less than 85.  He exercises 60-90 minutes 6 days a week without cardio pulmonary symptoms. He does describe decreased energy.  There is no family history of stroke or heart attack.   Review of Systems    Chest pain, palpitations, tachycardia, exertional dyspnea, paroxysmal nocturnal dyspnea, claudication or edema are absent.        Objective:   Physical Exam Appears healthy and well-nourished & in no acute distress  He does have slight ptosis of the right eye  No carotid bruits are present.No neck pain distention present at 10 - 15 degrees. Thyroid normal to palpation  S1 is split; S2 is accentuated. Heart rhythm and rate are normal with no gallop or murmur  Chest is clear with no increased work of breathing  There is no evidence of aortic aneurysm or renal artery bruits  Abdomen soft with no organomegaly or masses. No HJR  No clubbing, cyanosis or edema present.  Pedal pulses are intact   No ischemic skin changes are present . Fingernails healthy   Alert and oriented. He does appear  somewhat tense but ideation and interaction are normal.  Strength, tone, DTRs reflexes normal          Assessment & Plan:  See Current Assessment & Plan in Problem List under specific Diagnosis

## 2014-05-18 NOTE — Assessment & Plan Note (Signed)
Repeat NMR Lipoprofile to assess cardiovascular risk

## 2014-05-18 NOTE — Patient Instructions (Signed)
Minimal Blood Pressure Goal= AVERAGE < 140/90;  Ideal is an AVERAGE < 135/85. This AVERAGE should be calculated from @ least 5-7 BP readings taken @ different times of day on different days of week. You should not respond to isolated BP readings , but rather the AVERAGE for that week .Please bring your  blood pressure cuff to office visits to verify that it is reliable.It  can also be checked against the blood pressure device at the pharmacy. Finger or wrist cuffs are not dependable; an arm cuff is.  Fill the  prescription for the BP medication if BP NOT @ goal based on 14 day average. 

## 2014-05-18 NOTE — Progress Notes (Signed)
Pre visit review using our clinic review tool, if applicable. No additional management support is needed unless otherwise documented below in the visit note. 

## 2014-05-18 NOTE — Assessment & Plan Note (Signed)
Metoprolol 25 mg twice a day if blood pressure average is greater than 140/90 on average with home monitor over the next 2 weeks  Stress interventions discussed to include meditation, counseling, and possibly medication.The pathophysiology of neurotransmitter deficiency was discussed .

## 2015-01-24 ENCOUNTER — Other Ambulatory Visit: Payer: Self-pay | Admitting: Internal Medicine

## 2015-01-24 ENCOUNTER — Other Ambulatory Visit (INDEPENDENT_AMBULATORY_CARE_PROVIDER_SITE_OTHER): Payer: Self-pay

## 2015-01-24 ENCOUNTER — Ambulatory Visit (INDEPENDENT_AMBULATORY_CARE_PROVIDER_SITE_OTHER): Payer: Self-pay | Admitting: Internal Medicine

## 2015-01-24 ENCOUNTER — Encounter: Payer: Self-pay | Admitting: Internal Medicine

## 2015-01-24 VITALS — BP 128/100 | HR 57 | Temp 98.3°F | Resp 12 | Ht 67.0 in | Wt 182.1 lb

## 2015-01-24 DIAGNOSIS — Z0000 Encounter for general adult medical examination without abnormal findings: Secondary | ICD-10-CM

## 2015-01-24 LAB — CBC WITH DIFFERENTIAL/PLATELET
BASOS ABS: 0 10*3/uL (ref 0.0–0.1)
Basophils Relative: 0.6 % (ref 0.0–3.0)
Eosinophils Absolute: 0.1 10*3/uL (ref 0.0–0.7)
Eosinophils Relative: 2.5 % (ref 0.0–5.0)
HCT: 45.7 % (ref 39.0–52.0)
Hemoglobin: 15.7 g/dL (ref 13.0–17.0)
LYMPHS ABS: 1.7 10*3/uL (ref 0.7–4.0)
Lymphocytes Relative: 29.1 % (ref 12.0–46.0)
MCHC: 34.3 g/dL (ref 30.0–36.0)
MCV: 88.9 fl (ref 78.0–100.0)
MONO ABS: 0.6 10*3/uL (ref 0.1–1.0)
MONOS PCT: 9.8 % (ref 3.0–12.0)
NEUTROS ABS: 3.3 10*3/uL (ref 1.4–7.7)
Neutrophils Relative %: 58 % (ref 43.0–77.0)
Platelets: 204 10*3/uL (ref 150.0–400.0)
RBC: 5.14 Mil/uL (ref 4.22–5.81)
RDW: 13.9 % (ref 11.5–15.5)
WBC: 5.7 10*3/uL (ref 4.0–10.5)

## 2015-01-24 LAB — BASIC METABOLIC PANEL
BUN: 18 mg/dL (ref 6–23)
CALCIUM: 9.3 mg/dL (ref 8.4–10.5)
CO2: 31 mEq/L (ref 19–32)
Chloride: 104 mEq/L (ref 96–112)
Creatinine, Ser: 0.89 mg/dL (ref 0.40–1.50)
GFR: 91.27 mL/min (ref >60.00)
Glucose, Bld: 98 mg/dL (ref 70–99)
Potassium: 5 mEq/L (ref 3.5–5.1)
SODIUM: 138 meq/L (ref 135–145)

## 2015-01-24 LAB — URINALYSIS, ROUTINE W REFLEX MICROSCOPIC
BILIRUBIN URINE: NEGATIVE
Ketones, ur: NEGATIVE
Leukocytes, UA: NEGATIVE
NITRITE: NEGATIVE
PH: 6 (ref 5.0–8.0)
Specific Gravity, Urine: 1.02 (ref 1.000–1.030)
Total Protein, Urine: NEGATIVE
UROBILINOGEN UA: 0.2 (ref 0.0–1.0)
Urine Glucose: NEGATIVE

## 2015-01-24 LAB — HEPATIC FUNCTION PANEL
ALBUMIN: 4.5 g/dL (ref 3.5–5.2)
ALT: 20 U/L (ref 0–53)
AST: 21 U/L (ref 0–37)
Alkaline Phosphatase: 52 U/L (ref 39–117)
Bilirubin, Direct: 0.1 mg/dL (ref 0.0–0.3)
TOTAL PROTEIN: 6.9 g/dL (ref 6.0–8.3)
Total Bilirubin: 0.4 mg/dL (ref 0.2–1.2)

## 2015-01-24 LAB — TSH: TSH: 4.78 u[IU]/mL — AB (ref 0.35–4.50)

## 2015-01-24 LAB — PSA: PSA: 0.53 ng/mL (ref 0.10–4.00)

## 2015-01-24 NOTE — Patient Instructions (Signed)
  Your next office appointment will be determined based upon review of your pending labs . Those written interpretation of the lab results and instructions will be transmitted to you by My Chart Critical results will be called.  Followup as needed for any active or acute issue. Please report any significant change in your symptoms.  Minimal Blood Pressure Goal= AVERAGE < 140/90;  Ideal is an AVERAGE < 135/85. This AVERAGE should be calculated from @ least 5-7 BP readings taken @ different times of day on different days of week. You should not respond to isolated BP readings , but rather the AVERAGE for that week .Please bring your  blood pressure cuff to office visits to verify that it is reliable.It  can also be checked against the blood pressure device at the pharmacy. Finger or wrist cuffs are not dependable; an arm cuff is.

## 2015-01-24 NOTE — Progress Notes (Signed)
Pre visit review using our clinic review tool, if applicable. No additional management support is needed unless otherwise documented below in the visit note. 

## 2015-01-24 NOTE — Progress Notes (Signed)
Subjective:    Patient ID: Dale Gonzalez, male    DOB: [birth year], 64 y.o.   MRN: [id number]  HPI  He is here for a physical;acute issues denied.  He is on a heart healthy diet. He exercises 8-10 hours per week at a high level with no associated cardio pulmonary symptoms.  His colonoscopy was negative in 2013. He has no GI symptoms  His reactive airways disease which is cold-induced has been quiescent with preventive maneuvers.    Review of Systems  Chest pain, palpitations, tachycardia, exertional dyspnea, paroxysmal nocturnal dyspnea, claudication or edema are absent. No unexplained weight loss, abdominal pain, significant dyspepsia, dysphagia, melena, rectal bleeding, or persistently small caliber stools. Dysuria, pyuria, hematuria, frequency, nocturia or polyuria are denied. Change in hair, skin, nails denied. No bowel changes of constipation or diarrhea. No intolerance to heat or cold.     Objective:   Physical Exam Gen.: Adequately nourished in appearance. Alert, appropriate and cooperative throughout exam. BMI: Appears younger than stated age  Head: Normocephalic without obvious abnormalities;  no alopecia  Eyes: No corneal or conjunctival inflammation noted. Pupils equal round reactive to light and accommodation. Extraocular motion intact.  Ears: External  ear exam reveals no significant lesions or deformities. Canals clear .TMs normal. Hearing is grossly normal bilaterally. Nose: External nasal exam reveals no deformity or inflammation. Nasal mucosa are pink and moist. No lesions or exudates noted.   Mouth: Oral mucosa and oropharynx reveal no lesions or exudates. Teeth in good repair. Neck: No deformities, masses, or tenderness noted. Range of motion and. Thyroid normal. Lungs: Normal respiratory effort; chest expands symmetrically. Lungs are clear to auscultation without rales, wheezes, or increased work of breathing. Heart: Normal rate and rhythm. Normal S1 and S2. No  gallop, click, or rub. No murmur. Abdomen: Bowel sounds normal; abdomen soft and nontender. No masses, organomegaly or hernias noted. Genitalia: Genitalia normal except for left varices. Prostate is asymmetric with the right lobe 1.5+ larger than the left. There is no nodularity or induration                               Musculoskeletal/extremities: No deformity or scoliosis noted of  the thoracic or lumbar spine. No clubbing, cyanosis, edema, or significant extremity  deformity noted.  Range of motion normal . Tone & strength normal. Hand joints normal Fingernail  health good. Slight crepitus of knees  Able to lie down & sit up w/o help.  Negative SLR bilaterally Vascular: Carotid, radial artery, dorsalis pedis and  posterior tibial pulses are full and equal. No bruits present. Neurologic: Alert and oriented x3. Deep tendon reflexes symmetrical and normal.  Gait normal       Skin: Intact without suspicious lesions or rashes. Lymph: No cervical, axillary, or inguinal lymphadenopathy present. Psych: Mood and affect are normal. Normally interactive                                                                                       Assessment & Plan:  #1 comprehensive physical exam; no acute findings  Plan: see Orders  &  Recommendations

## 2015-01-25 ENCOUNTER — Other Ambulatory Visit: Payer: Self-pay | Admitting: Internal Medicine

## 2015-01-25 DIAGNOSIS — R7989 Other specified abnormal findings of blood chemistry: Secondary | ICD-10-CM | POA: Insufficient documentation

## 2015-01-26 LAB — NMR LIPOPROFILE WITH LIPIDS
CHOLESTEROL, TOTAL: 180 mg/dL (ref 100–199)
HDL Particle Number: 23.3 umol/L — ABNORMAL LOW (ref >=30.5)
HDL SIZE: 8.5 nm — AB (ref >=9.2)
HDL-C: 34 mg/dL — ABNORMAL LOW (ref >39)
LDL (calc): 112 mg/dL — ABNORMAL HIGH (ref 0–99)
LDL Particle Number: 1714 nmol/L — ABNORMAL HIGH (ref <1000)
LDL Size: 20.5 nm (ref >=20.8)
LP-IR SCORE: 75 — AB (ref <=45)
Large VLDL-P: 4.3 nmol/L — ABNORMAL HIGH (ref <=2.7)
SMALL LDL PARTICLE NUMBER: 840 nmol/L — AB (ref <=527)
Triglycerides: 168 mg/dL — ABNORMAL HIGH (ref 0–149)
VLDL SIZE: 50.1 nm — AB (ref <=46.6)

## 2015-05-08 ENCOUNTER — Encounter: Payer: Self-pay | Admitting: Internal Medicine

## 2015-06-03 ENCOUNTER — Encounter (HOSPITAL_COMMUNITY): Payer: Self-pay | Admitting: Emergency Medicine

## 2015-06-03 ENCOUNTER — Emergency Department (HOSPITAL_COMMUNITY)
Admission: EM | Admit: 2015-06-03 | Discharge: 2015-06-03 | Disposition: A | Discharge status: Home / Self Care | Payer: Medicare Other | Source: Home / Self Care | Attending: Family Medicine | Admitting: Family Medicine

## 2015-06-03 DIAGNOSIS — R0982 Postnasal drip: Secondary | ICD-10-CM

## 2015-06-03 DIAGNOSIS — J4521 Mild intermittent asthma with (acute) exacerbation: Secondary | ICD-10-CM | POA: Diagnosis not present

## 2015-06-03 DIAGNOSIS — J302 Other seasonal allergic rhinitis: Secondary | ICD-10-CM | POA: Diagnosis not present

## 2015-06-03 MED ORDER — ALBUTEROL SULFATE (2.5 MG/3ML) 0.083% IN NEBU
INHALATION_SOLUTION | RESPIRATORY_TRACT | Status: AC
  Filled 2015-06-03: qty 3

## 2015-06-03 MED ORDER — IPRATROPIUM-ALBUTEROL 0.5-2.5 (3) MG/3ML IN SOLN
RESPIRATORY_TRACT | Status: AC
  Filled 2015-06-03: qty 3

## 2015-06-03 MED ORDER — IPRATROPIUM-ALBUTEROL 0.5-2.5 (3) MG/3ML IN SOLN
3.0000 mL | Freq: Once | RESPIRATORY_TRACT | Status: AC
  Administered 2015-06-03: 3 mL via RESPIRATORY_TRACT

## 2015-06-03 MED ORDER — BECLOMETHASONE DIPROPIONATE 80 MCG/ACT IN AERS: 1.0000 | INHALATION_SPRAY | Freq: Two times a day (BID) | RESPIRATORY_TRACT | Status: DC

## 2015-06-03 MED ORDER — ALBUTEROL SULFATE (2.5 MG/3ML) 0.083% IN NEBU
2.5000 mg | INHALATION_SOLUTION | Freq: Once | RESPIRATORY_TRACT | Status: AC
  Administered 2015-06-03: 2.5 mg via RESPIRATORY_TRACT

## 2015-06-03 MED ORDER — ALBUTEROL SULFATE HFA 108 (90 BASE) MCG/ACT IN AERS: 2.0000 | INHALATION_SPRAY | RESPIRATORY_TRACT | Status: DC | PRN

## 2015-06-03 NOTE — Discharge Instructions (Signed)
Allergic Rhinitis Allergic rhinitis is when the mucous membranes in the nose respond to allergens. Allergens are particles in the air that cause your body to have an allergic reaction. This causes you to release allergic antibodies. Through a chain of events, these eventually cause you to release histamine into the blood stream. Although meant to protect the body, it is this release of histamine that causes your discomfort, such as frequent sneezing, congestion, and an itchy, runny nose.  CAUSES Seasonal allergic rhinitis (hay fever) is caused by pollen allergens that may come from grasses, trees, and weeds. Year-round allergic rhinitis (perennial allergic rhinitis) is caused by allergens such as house dust mites, pet dander, and mold spores. SYMPTOMS 1. Nasal stuffiness (congestion). 2. Itchy, runny nose with sneezing and tearing of the eyes. DIAGNOSIS Your health care provider can help you determine the allergen or allergens that trigger your symptoms. If you and your health care provider are unable to determine the allergen, skin or blood testing may be used. Your health care provider will diagnose your condition after taking your health history and performing a physical exam. Your health care provider may assess you for other related conditions, such as asthma, pink eye, or an ear infection. TREATMENT Allergic rhinitis does not have a cure, but it can be controlled by:  Medicines that block allergy symptoms. These may include allergy shots, nasal sprays, and oral antihistamines.  Avoiding the allergen. Hay fever may often be treated with antihistamines in pill or nasal spray forms. Antihistamines block the effects of histamine. There are over-the-counter medicines that may help with nasal congestion and swelling around the eyes. Check with your health care provider before taking or giving this medicine. If avoiding the allergen or the medicine prescribed do not work, there are many new medicines  your health care provider can prescribe. Stronger medicine may be used if initial measures are ineffective. Desensitizing injections can be used if medicine and avoidance does not work. Desensitization is when a patient is given ongoing shots until the body becomes less sensitive to the allergen. Make sure you follow up with your health care provider if problems continue. HOME CARE INSTRUCTIONS It is not possible to completely avoid allergens, but you can reduce your symptoms by taking steps to limit your exposure to them. It helps to know exactly what you are allergic to so that you can avoid your specific triggers. SEEK MEDICAL CARE IF:  You have a fever.  You develop a cough that does not stop easily (persistent).  You have shortness of breath.  You start wheezing.  Symptoms interfere with normal daily activities.   This information is not intended to replace advice given to you by your health care provider. Make sure you discuss any questions you have with your health care provider.   Document Released: 05/01/2001 Document Revised: 08/27/2014 Document Reviewed: 04/13/2013 Elsevier Interactive Patient Education 2016 [employer].  How to Use an Inhaler Using your inhaler correctly is very important. Good technique will make sure that the medicine reaches your lungs.  HOW TO USE AN INHALER: 3. Take the cap off the inhaler. 4. If this is the first time using your inhaler, you need to prime it. Shake the inhaler for 5 seconds. Release four puffs into the air, away from your face. Ask your doctor for help if you have questions. 5. Shake the inhaler for 5 seconds. 6. Turn the inhaler so the bottle is above the mouthpiece. 7. Put your pointer finger on top of  the bottle. Your thumb holds the bottom of the inhaler. 8. Open your mouth. 9. Either hold the inhaler away from your mouth (the width of 2 fingers) or place your lips tightly around the mouthpiece. Ask your doctor which way to use  your inhaler. 10. Breathe out as much air as possible. 11. Breathe in and push down on the bottle 1 time to release the medicine. You will feel the medicine go in your mouth and throat. 12. Continue to take a deep breath in very slowly. Try to fill your lungs. 13. After you have breathed in completely, hold your breath for 10 seconds. This will help the medicine to settle in your lungs. If you cannot hold your breath for 10 seconds, hold it for as long as you can before you breathe out. 14. Breathe out slowly, through pursed lips. Whistling is an example of pursed lips. 15. If your doctor has told you to take more than 1 puff, wait at least 15-30 seconds between puffs. This will help you get the best results from your medicine. Do not use the inhaler more than your doctor tells you to. 16. Put the cap back on the inhaler. 17. Follow the directions from your doctor or from the inhaler package about cleaning the inhaler. If you use more than one inhaler, ask your doctor which inhalers to use and what order to use them in. Ask your doctor to help you figure out when you will need to refill your inhaler.  If you use a steroid inhaler, always rinse your mouth with water after your last puff, gargle and spit out the water. Do not swallow the water. GET HELP IF:  The inhaler medicine only partially helps to stop wheezing or shortness of breath.  You are having trouble using your inhaler.  You have some increase in thick spit (phlegm). GET HELP RIGHT AWAY IF:  The inhaler medicine does not help your wheezing or shortness of breath or you have tightness in your chest.  You have dizziness, headaches, or fast heart rate.  You have chills, fever, or night sweats.  You have a large increase of thick spit, or your thick spit is bloody. MAKE SURE YOU:   Understand these instructions.  Will watch your condition.  Will get help right away if you are not doing well or get worse.   This information  is not intended to replace advice given to you by your health care provider. Make sure you discuss any questions you have with your health care provider.   Document Released: 05/15/2008 Document Revised: 05/27/2013 Document Reviewed: 03/05/2013 Elsevier Interactive Patient Education [employer].

## 2015-06-03 NOTE — ED Provider Notes (Signed)
CSN: [id number]     Arrival date & time 06/03/15  1747 History   First MD Initiated Contact with Patient 06/03/15 1830     Chief Complaint  Patient presents with  . Shortness of Breath   (Consider location/radiation/quality/duration/timing/severity/associated sxs/prior Treatment) HPI Comments: 64 year old male presents with shortness of breath and cough. He received a flu vaccination at 1:30 PM today partly 2 hours later he developed the respiratory symptoms. He has also had PND for several days to weeks. He is clearing his throat frequently. He has a history of reactive airway disease. He is awake, alert, sitting on the exam table, communicating well speaking well in complete sentences. No obvious objective respiratory distress.   Past Medical History  Diagnosis Date  . Allergy     seasonal  . RAD (reactive airway disease) 1998    cold induced cough @ [place]  . Basal cell cancer     Dr Wilhemina Bonito  . Hyperlipidemia     LDL goal = <115   Past Surgical History  Procedure Laterality Date  . Ganglionectomy      L wrist; Dr Wonda Olds  . Tonsillectomy    . Colonoscopy  2003 & 2013    negative; Dr Sharlett Iles   Family History  Problem Relation Age of Onset  . Cancer Mother      cervical lymph nodes  . Hypertension Father   . Heart disease Father     bypass surgery  . Heart attack Father 60    Colostomy for ? diagnosis  . COPD Father   . Colon cancer Neg Hx   . Esophageal cancer Neg Hx   . Stomach cancer Neg Hx   . Rectal cancer Neg Hx   . Stroke Neg Hx   . Diabetes Neg Hx    Social History  Substance Use Topics  . Smoking status: Never Smoker   . Smokeless tobacco: Never Used  . Alcohol Use: 0.0 oz/week     Comment: rarely    Review of Systems  Constitutional: Positive for activity change. Negative for fever and fatigue.  HENT: Positive for postnasal drip.   Eyes: Negative.   Respiratory: Positive for cough and shortness of breath.   Cardiovascular: Negative.    Genitourinary: Negative.   Skin: Negative.  Negative for color change and rash.  Neurological: Negative.     Allergies  Bee venom  Home Medications   Prior to Admission medications   Medication Sig Start Date End Date Taking? Authorizing Provider  albuterol (PROVENTIL HFA;VENTOLIN HFA) 108 (90 BASE) MCG/ACT inhaler Inhale 2 puffs into the lungs every 4 (four) hours as needed for wheezing or shortness of breath. 06/03/15   Janne Napoleon, NP  beclomethasone (QVAR) 80 MCG/ACT inhaler Inhale 1 puff into the lungs 2 (two) times daily. 06/03/15   Janne Napoleon, NP  EPINEPHrine (EPIPEN) 0.3 mg/0.3 mL SOAJ injection Inject 0.3 mLs (0.3 mg total) into the muscle once. 11/27/13   Hendricks Limes, MD   Meds Ordered and Administered this Visit   Medications  ipratropium-albuterol (DUONEB) 0.5-2.5 (3) MG/3ML nebulizer solution 3 mL (3 mLs Nebulization Given 06/03/15 1902)  albuterol (PROVENTIL) (2.5 MG/3ML) 0.083% nebulizer solution 2.5 mg (2.5 mg Nebulization Given 06/03/15 1902)    BP 112/52 mmHg  Pulse 82  Temp(Src) 99.5 F (37.5 C) (Oral)  Resp 16  SpO2 97% No data found.   Physical Exam  Constitutional: He is oriented to person, place, and time. He appears well-developed and well-nourished. No distress.  HENT:  Mouth/Throat: No oropharyngeal exudate.  Oropharynx with injection, mild erythema and copious clear PND.  Eyes: Conjunctivae and EOM are normal.  Neck: Normal range of motion. Neck supple.  Cardiovascular: Normal rate, regular rhythm and normal heart sounds.   Pulmonary/Chest: Effort normal. No respiratory distress. He has no rales.  Taking deep breaths causes call spasms. Mild distant coarseness. No prolonged wheezing.   Musculoskeletal: He exhibits no edema.  Lymphadenopathy:    He has no cervical adenopathy.  Neurological: He is alert and oriented to person, place, and time. He exhibits normal muscle tone.  Skin: Skin is warm and dry. No erythema.  Psychiatric: He has a  normal mood and affect.  Nursing note and vitals reviewed.   ED Course  Procedures (including critical care time)  Labs Review Labs Reviewed - No data to display  Imaging Review No results found.   Visual Acuity Review  Right Eye Distance:   Left Eye Distance:   Bilateral Distance:    Right Eye Near:   Left Eye Near:    Bilateral Near:         MDM   1. Other seasonal allergic rhinitis   2. PND (post-nasal drip)   3. RAD (reactive airway disease) with wheezing, mild intermittent, with acute exacerbation    Patient received a DuoNeb 5 mg/2.5 mg. He received excellent relief, states he is breathing better, much less cough and on auscultation lungs clear with no wheeze, coarseness. Have decided not to treat with oral or parenteral sterile since he received his flu vaccination this afternoon. Will give him a prescription for albuterol HFA as well as Qvar 81 puff 2 times a day to use for the next few days.  Janne Napoleon, NP 06/03/15 [phone]

## 2015-06-03 NOTE — ED Notes (Signed)
Patient reports cold symptoms for a month.  Today received a flu shot.  This evening, patient reported feeling bad.  Reports a cough and inability to breathe deeply because he will cough with deep inspiration.  Denies fever.  Patient has coughing episodes that end with retching and small amount of emesis

## 2015-06-09 NOTE — ED Notes (Signed)
Message on answering machine from [facility] requesting substitution of Flovent for Q-Var. Discussed w Elenora Fender, NP, who authorized flovent 110, 1 puff BID, , #1 MDI, NR. Called Rx to [facility], left detailed message on machine

## 2015-07-18 ENCOUNTER — Encounter: Payer: Self-pay | Admitting: Gastroenterology

## 2015-07-26 ENCOUNTER — Encounter: Payer: Self-pay | Admitting: *Deleted

## 2015-07-26 ENCOUNTER — Other Ambulatory Visit: Payer: Self-pay | Admitting: Internal Medicine

## 2015-07-26 ENCOUNTER — Encounter: Payer: Self-pay | Admitting: Internal Medicine

## 2015-07-26 DIAGNOSIS — Z1159 Encounter for screening for other viral diseases: Secondary | ICD-10-CM

## 2015-07-26 NOTE — Progress Notes (Signed)
Sent pt mychart msg with md response...Dale Gonzalez

## 2015-07-26 NOTE — Telephone Encounter (Signed)
Sent pt msg via mychart with md response.../l;mb

## 2015-08-02 ENCOUNTER — Ambulatory Visit (INDEPENDENT_AMBULATORY_CARE_PROVIDER_SITE_OTHER): Payer: Medicare Other

## 2015-08-02 ENCOUNTER — Other Ambulatory Visit: Payer: Medicare Other

## 2015-08-02 DIAGNOSIS — Z23 Encounter for immunization: Secondary | ICD-10-CM

## 2015-08-02 DIAGNOSIS — Z1159 Encounter for screening for other viral diseases: Secondary | ICD-10-CM

## 2015-08-03 LAB — HEPATITIS C ANTIBODY: HCV AB: NEGATIVE

## 2015-08-17 ENCOUNTER — Telehealth: Payer: Self-pay | Admitting: *Deleted

## 2015-08-17 NOTE — Telephone Encounter (Signed)
Left msg on triage stating have ? Concerning pt medication. Pls call back with ref # [id number]. Called back automated system stated all representative are busy pls leave msg for RTC. Will retry later...Johny Chess

## 2015-08-17 NOTE — Telephone Encounter (Signed)
Called back spoke with Rep gave Ref# he stated he was calling to get clinical question for medication Trajenta 5mg . Per chart pt is not taking Trajenta. Pt not even diabetic inform if pt wanting medication to have him contact md office...Dale Gonzalez

## 2015-08-26 ENCOUNTER — Telehealth: Payer: Self-pay | Admitting: Emergency Medicine

## 2015-08-26 NOTE — Telephone Encounter (Signed)
Pharmacy called back and gave same information as stated below. Rep stated that the PA will be with drawn.

## 2015-08-26 NOTE — Telephone Encounter (Signed)
LVM for pt to call back in regards to a form that was sent over by [facility] for Tradjenta. Do not see this medication on pts past or current medication list.

## 2015-09-19 DIAGNOSIS — H52223 Regular astigmatism, bilateral: Secondary | ICD-10-CM | POA: Diagnosis not present

## 2015-09-19 DIAGNOSIS — H1045 Other chronic allergic conjunctivitis: Secondary | ICD-10-CM | POA: Diagnosis not present

## 2015-09-19 DIAGNOSIS — H5213 Myopia, bilateral: Secondary | ICD-10-CM | POA: Diagnosis not present

## 2015-09-19 DIAGNOSIS — H524 Presbyopia: Secondary | ICD-10-CM | POA: Diagnosis not present

## 2015-09-29 DIAGNOSIS — Z6828 Body mass index (BMI) 28.0-28.9, adult: Secondary | ICD-10-CM | POA: Diagnosis not present

## 2015-09-29 DIAGNOSIS — E7800 Pure hypercholesterolemia, unspecified: Secondary | ICD-10-CM | POA: Diagnosis not present

## 2015-09-29 DIAGNOSIS — J4520 Mild intermittent asthma, uncomplicated: Secondary | ICD-10-CM | POA: Diagnosis not present

## 2015-09-29 DIAGNOSIS — K219 Gastro-esophageal reflux disease without esophagitis: Secondary | ICD-10-CM | POA: Diagnosis not present

## 2015-09-29 DIAGNOSIS — Z1389 Encounter for screening for other disorder: Secondary | ICD-10-CM | POA: Diagnosis not present

## 2015-09-29 DIAGNOSIS — Z91030 Bee allergy status: Secondary | ICD-10-CM | POA: Diagnosis not present

## 2015-09-29 DIAGNOSIS — I10 Essential (primary) hypertension: Secondary | ICD-10-CM | POA: Diagnosis not present

## 2015-09-29 DIAGNOSIS — J302 Other seasonal allergic rhinitis: Secondary | ICD-10-CM | POA: Diagnosis not present

## 2015-10-10 DIAGNOSIS — L821 Other seborrheic keratosis: Secondary | ICD-10-CM | POA: Diagnosis not present

## 2015-10-10 DIAGNOSIS — Z85828 Personal history of other malignant neoplasm of skin: Secondary | ICD-10-CM | POA: Diagnosis not present

## 2015-10-10 DIAGNOSIS — D485 Neoplasm of uncertain behavior of skin: Secondary | ICD-10-CM | POA: Diagnosis not present

## 2015-10-10 DIAGNOSIS — L570 Actinic keratosis: Secondary | ICD-10-CM | POA: Diagnosis not present

## 2015-10-10 DIAGNOSIS — C44319 Basal cell carcinoma of skin of other parts of face: Secondary | ICD-10-CM | POA: Diagnosis not present

## 2015-10-21 ENCOUNTER — Encounter: Payer: Self-pay | Admitting: Internal Medicine

## 2015-10-26 DIAGNOSIS — C44319 Basal cell carcinoma of skin of other parts of face: Secondary | ICD-10-CM | POA: Diagnosis not present

## 2015-10-26 DIAGNOSIS — Z85828 Personal history of other malignant neoplasm of skin: Secondary | ICD-10-CM | POA: Diagnosis not present

## 2016-03-20 DIAGNOSIS — I10 Essential (primary) hypertension: Secondary | ICD-10-CM | POA: Diagnosis not present

## 2016-03-20 DIAGNOSIS — E7800 Pure hypercholesterolemia, unspecified: Secondary | ICD-10-CM | POA: Diagnosis not present

## 2016-03-20 DIAGNOSIS — Z125 Encounter for screening for malignant neoplasm of prostate: Secondary | ICD-10-CM | POA: Diagnosis not present

## 2016-03-20 DIAGNOSIS — R8299 Other abnormal findings in urine: Secondary | ICD-10-CM | POA: Diagnosis not present

## 2016-03-27 DIAGNOSIS — J4520 Mild intermittent asthma, uncomplicated: Secondary | ICD-10-CM | POA: Diagnosis not present

## 2016-03-27 DIAGNOSIS — Z1212 Encounter for screening for malignant neoplasm of rectum: Secondary | ICD-10-CM | POA: Diagnosis not present

## 2016-03-27 DIAGNOSIS — Z1389 Encounter for screening for other disorder: Secondary | ICD-10-CM | POA: Diagnosis not present

## 2016-03-27 DIAGNOSIS — I10 Essential (primary) hypertension: Secondary | ICD-10-CM | POA: Diagnosis not present

## 2016-03-27 DIAGNOSIS — E7800 Pure hypercholesterolemia, unspecified: Secondary | ICD-10-CM | POA: Diagnosis not present

## 2016-03-27 DIAGNOSIS — Z6826 Body mass index (BMI) 26.0-26.9, adult: Secondary | ICD-10-CM | POA: Diagnosis not present

## 2016-03-27 DIAGNOSIS — D692 Other nonthrombocytopenic purpura: Secondary | ICD-10-CM | POA: Diagnosis not present

## 2016-03-27 DIAGNOSIS — Z0000 Encounter for general adult medical examination without abnormal findings: Secondary | ICD-10-CM | POA: Diagnosis not present

## 2016-03-27 DIAGNOSIS — Z91030 Bee allergy status: Secondary | ICD-10-CM | POA: Diagnosis not present

## 2016-03-27 DIAGNOSIS — K219 Gastro-esophageal reflux disease without esophagitis: Secondary | ICD-10-CM | POA: Diagnosis not present

## 2016-03-27 DIAGNOSIS — Z23 Encounter for immunization: Secondary | ICD-10-CM | POA: Diagnosis not present

## 2016-03-27 DIAGNOSIS — J302 Other seasonal allergic rhinitis: Secondary | ICD-10-CM | POA: Diagnosis not present

## 2016-06-16 DIAGNOSIS — Z23 Encounter for immunization: Secondary | ICD-10-CM | POA: Diagnosis not present

## 2016-10-09 DIAGNOSIS — L918 Other hypertrophic disorders of the skin: Secondary | ICD-10-CM | POA: Diagnosis not present

## 2016-10-09 DIAGNOSIS — L570 Actinic keratosis: Secondary | ICD-10-CM | POA: Diagnosis not present

## 2016-10-09 DIAGNOSIS — Z85828 Personal history of other malignant neoplasm of skin: Secondary | ICD-10-CM | POA: Diagnosis not present

## 2016-10-09 DIAGNOSIS — D1801 Hemangioma of skin and subcutaneous tissue: Secondary | ICD-10-CM | POA: Diagnosis not present

## 2016-10-09 DIAGNOSIS — L821 Other seborrheic keratosis: Secondary | ICD-10-CM | POA: Diagnosis not present

## 2016-12-17 DIAGNOSIS — H524 Presbyopia: Secondary | ICD-10-CM | POA: Diagnosis not present

## 2016-12-17 DIAGNOSIS — H2513 Age-related nuclear cataract, bilateral: Secondary | ICD-10-CM | POA: Diagnosis not present

## 2016-12-17 DIAGNOSIS — H52223 Regular astigmatism, bilateral: Secondary | ICD-10-CM | POA: Diagnosis not present

## 2016-12-17 DIAGNOSIS — H5213 Myopia, bilateral: Secondary | ICD-10-CM | POA: Diagnosis not present

## 2017-02-11 DIAGNOSIS — D485 Neoplasm of uncertain behavior of skin: Secondary | ICD-10-CM | POA: Diagnosis not present

## 2017-02-11 DIAGNOSIS — L570 Actinic keratosis: Secondary | ICD-10-CM | POA: Diagnosis not present

## 2017-02-11 DIAGNOSIS — Z85828 Personal history of other malignant neoplasm of skin: Secondary | ICD-10-CM | POA: Diagnosis not present

## 2017-02-11 DIAGNOSIS — L821 Other seborrheic keratosis: Secondary | ICD-10-CM | POA: Diagnosis not present

## 2017-02-11 DIAGNOSIS — C44629 Squamous cell carcinoma of skin of left upper limb, including shoulder: Secondary | ICD-10-CM | POA: Diagnosis not present

## 2017-03-29 DIAGNOSIS — Z125 Encounter for screening for malignant neoplasm of prostate: Secondary | ICD-10-CM | POA: Diagnosis not present

## 2017-03-29 DIAGNOSIS — N390 Urinary tract infection, site not specified: Secondary | ICD-10-CM | POA: Diagnosis not present

## 2017-03-29 DIAGNOSIS — E7800 Pure hypercholesterolemia, unspecified: Secondary | ICD-10-CM | POA: Diagnosis not present

## 2017-03-29 DIAGNOSIS — I10 Essential (primary) hypertension: Secondary | ICD-10-CM | POA: Diagnosis not present

## 2017-03-29 DIAGNOSIS — R8299 Other abnormal findings in urine: Secondary | ICD-10-CM | POA: Diagnosis not present

## 2017-04-03 DIAGNOSIS — Z91030 Bee allergy status: Secondary | ICD-10-CM | POA: Diagnosis not present

## 2017-04-03 DIAGNOSIS — J4520 Mild intermittent asthma, uncomplicated: Secondary | ICD-10-CM | POA: Diagnosis not present

## 2017-04-03 DIAGNOSIS — I10 Essential (primary) hypertension: Secondary | ICD-10-CM | POA: Diagnosis not present

## 2017-04-03 DIAGNOSIS — K219 Gastro-esophageal reflux disease without esophagitis: Secondary | ICD-10-CM | POA: Diagnosis not present

## 2017-04-03 DIAGNOSIS — Z6826 Body mass index (BMI) 26.0-26.9, adult: Secondary | ICD-10-CM | POA: Diagnosis not present

## 2017-04-03 DIAGNOSIS — M7651 Patellar tendinitis, right knee: Secondary | ICD-10-CM | POA: Diagnosis not present

## 2017-04-03 DIAGNOSIS — E7800 Pure hypercholesterolemia, unspecified: Secondary | ICD-10-CM | POA: Diagnosis not present

## 2017-04-03 DIAGNOSIS — Z0000 Encounter for general adult medical examination without abnormal findings: Secondary | ICD-10-CM | POA: Diagnosis not present

## 2017-04-03 DIAGNOSIS — D692 Other nonthrombocytopenic purpura: Secondary | ICD-10-CM | POA: Diagnosis not present

## 2017-04-03 DIAGNOSIS — J302 Other seasonal allergic rhinitis: Secondary | ICD-10-CM | POA: Diagnosis not present

## 2017-04-03 DIAGNOSIS — Z1389 Encounter for screening for other disorder: Secondary | ICD-10-CM | POA: Diagnosis not present

## 2017-04-15 DIAGNOSIS — Z1212 Encounter for screening for malignant neoplasm of rectum: Secondary | ICD-10-CM | POA: Diagnosis not present

## 2017-05-15 DIAGNOSIS — Z6826 Body mass index (BMI) 26.0-26.9, adult: Secondary | ICD-10-CM | POA: Diagnosis not present

## 2017-05-15 DIAGNOSIS — J302 Other seasonal allergic rhinitis: Secondary | ICD-10-CM | POA: Diagnosis not present

## 2017-05-15 DIAGNOSIS — J4520 Mild intermittent asthma, uncomplicated: Secondary | ICD-10-CM | POA: Diagnosis not present

## 2017-05-15 DIAGNOSIS — L0889 Other specified local infections of the skin and subcutaneous tissue: Secondary | ICD-10-CM | POA: Diagnosis not present

## 2017-05-18 DIAGNOSIS — Z23 Encounter for immunization: Secondary | ICD-10-CM | POA: Diagnosis not present

## 2017-07-27 DIAGNOSIS — R079 Chest pain, unspecified: Secondary | ICD-10-CM | POA: Diagnosis not present

## 2017-07-27 DIAGNOSIS — I2510 Atherosclerotic heart disease of native coronary artery without angina pectoris: Secondary | ICD-10-CM | POA: Diagnosis not present

## 2017-07-27 DIAGNOSIS — Z8249 Family history of ischemic heart disease and other diseases of the circulatory system: Secondary | ICD-10-CM | POA: Diagnosis not present

## 2017-07-27 DIAGNOSIS — I249 Acute ischemic heart disease, unspecified: Secondary | ICD-10-CM | POA: Diagnosis not present

## 2017-07-27 DIAGNOSIS — I214 Non-ST elevation (NSTEMI) myocardial infarction: Secondary | ICD-10-CM | POA: Diagnosis not present

## 2017-07-27 DIAGNOSIS — I2129 ST elevation (STEMI) myocardial infarction involving other sites: Secondary | ICD-10-CM | POA: Diagnosis not present

## 2017-07-27 DIAGNOSIS — I219 Acute myocardial infarction, unspecified: Secondary | ICD-10-CM

## 2017-07-27 DIAGNOSIS — E785 Hyperlipidemia, unspecified: Secondary | ICD-10-CM | POA: Diagnosis not present

## 2017-07-27 DIAGNOSIS — I493 Ventricular premature depolarization: Secondary | ICD-10-CM | POA: Diagnosis not present

## 2017-07-27 DIAGNOSIS — I10 Essential (primary) hypertension: Secondary | ICD-10-CM | POA: Diagnosis not present

## 2017-07-27 DIAGNOSIS — I340 Nonrheumatic mitral (valve) insufficiency: Secondary | ICD-10-CM | POA: Diagnosis not present

## 2017-07-27 DIAGNOSIS — I25110 Atherosclerotic heart disease of native coronary artery with unstable angina pectoris: Secondary | ICD-10-CM | POA: Diagnosis not present

## 2017-07-27 HISTORY — DX: Acute myocardial infarction, unspecified: I21.9

## 2017-08-02 ENCOUNTER — Other Ambulatory Visit: Payer: Self-pay | Admitting: *Deleted

## 2017-08-02 DIAGNOSIS — E7800 Pure hypercholesterolemia, unspecified: Secondary | ICD-10-CM | POA: Diagnosis not present

## 2017-08-02 DIAGNOSIS — I10 Essential (primary) hypertension: Secondary | ICD-10-CM | POA: Diagnosis not present

## 2017-08-02 DIAGNOSIS — Z6826 Body mass index (BMI) 26.0-26.9, adult: Secondary | ICD-10-CM | POA: Diagnosis not present

## 2017-08-02 DIAGNOSIS — I2510 Atherosclerotic heart disease of native coronary artery without angina pectoris: Secondary | ICD-10-CM | POA: Diagnosis not present

## 2017-08-02 NOTE — Patient Outreach (Signed)
[facility]) Care Management  08/02/2017  Dale Gonzalez 1950-10-30 [id number]  Referral via [place]: patient discharged from [facility] 07/29/2017:  Telephone call to patient who was advised of reason for call & [facility] care management services.   Patient not willing to give HIPPA verification. Toll free number given to patient for [facility]. States he would call back if he felt comfortable after he checks services.  Plan: Will follow up.  Sherrin Daisy, RN BSN [place] Management Coordinator [facility] Care Management  [phone]

## 2017-08-05 ENCOUNTER — Other Ambulatory Visit: Payer: Self-pay | Admitting: *Deleted

## 2017-08-06 DIAGNOSIS — I252 Old myocardial infarction: Secondary | ICD-10-CM | POA: Diagnosis not present

## 2017-08-06 NOTE — Progress Notes (Deleted)
Dale Gonzalez August 29, 2017 11:30 AM Location: [facility] Patient #: [phone] DOB: 1950/12/12 Married / Language: Dale Gonzalez / Race: White Male   History of Present Illness [name] [name] MD; 2017/08/29 1:26 PM) Patient words: NP EVAL for cad, s/p ptca.  The patient is a 66 year old male, accompanied by his wife during the visit, who presents with coronary artery disease. Patient referred to [place] on a stat basis by Dr. Osborne Casco.  Very pleasant and physically active 66 year old Caucasian male with no prior history, had STEMI on 07/27/2017 with completely occluded OM1 branch. He underwent primary PCI with 2 overlapping stents synergy 2.25 x 38 mm and 2.25 x 12 mm. He has severe, non-culprit coronary artery disease in proximal and mid LAD as well as dominant but small caliber proximal and mid right coronary artery. He was started on dual antiplatelet therapy with aspirin and Brilinta, metoprolol 12.5 mg twice daily, and Lipitor. He was recommended to undergo complete revascularization on returning to [place].  Patient has not resumed physical activity since his MI. However, the other day when he tried to perform some exercise he had short lasting central chest tightness. Patient is very physically active at baseline and and would like to get back to his routine of spin classes.  I have personally reviewed his catheter films from [facility] in [place].   Problem List/Past Medical [name]; 29-Aug-2017 11:23 AM) Laboratory examination (Z01.89)  History of MI (myocardial infarction) (I25.2) [07/27/2017]:  Allergies [name]; 2017-08-29 11:25 AM) No Known Drug Allergies [2017-08-29]:  Family History [name]; 2017/08/29 11:26 AM) Mother  In stable health. BP, no heart issues Father  Deceased. at age 79; bypass, bladder cancer Brother 2  1 older; 1 younger; no heart issues  Social History [name]; 29-Aug-2017 11:27 AM) Current  tobacco use  Never smoker. Marital status  Married. Alcohol Use  Occasional alcohol use. vodka Living Situation  Lives with spouse. Number of Children  2.  Past Surgical History Frances Furbish [name]; 08/29/2017 11:27 AM) None [August 29, 2017]:  Medication History [name]; 29-Aug-2017 11:30 AM) Atorvastatin Calcium (80MG  Tablet, 1 Oral daily) Active. Brilinta (90MG  Tablet, 1 Oral every 12 hours) Active. Metoprolol Tartrate (25MG  Tablet, 1/2 Oral two times daily) Active. Aspirin (81MG  Capsule, 1 Oral daily) Active. Medications Reconciled (list present)  Diagnostic Studies History [name]; 2017/08/29 11:24 AM) Coronary Angiogram [07/27/2017]:  Vitals [name]; 08/29/17 11:32 AM) 08/29/17 11:21 AM Weight: 171.44 lb Height: 67in Body Surface Area: 1.89 m Body Mass Index: 26.85 kg/m  Pulse: 60 (Regular)  P.OX: 98% (Room air) BP: 121/78 (Sitting, Left Arm, Standard)       Assessment & Plan (Anesha Hackert MD; 08-29-17 1:25 PM) History of MI (myocardial infarction) (I25.2) Story: EKG Aug 29, 2017: Sinus bradycardia 58 bpm. Left axis deviation. Old inferior infarct. Poor R-wave progression. Low voltage. Current Plans Complete electrocardiogram (93000) Note:Recommendations:  65 year old Caucasian male with recent STEMI status post successful primary PCI to OM1. Vesicular non-culprit severe disease LAD and RCA. I discussed options of continued medical management versus PCI. Patient would like to proceed with PCI. I will perform this on Thursday 08/07/2017. He may need atherectomy to LAD. Subject to the amount of contrast used, I may also perform PCI to his proximal and mid RCA.After the PCI, I recommend starting him on low-dose ACE inhibitor true prevent adverse remodeling.  Cc Domenick Gong, MD  Signed electronically by Vernell Leep, MD (08/29/2017 1:26 PM)

## 2017-08-06 NOTE — Progress Notes (Signed)
Labs 08/02/2017: Glucose 90.  BUN/creatinine 22/0.7.  EGFR normal.  Sodium 138, potassium 4.7. H/H 15/45.  MCV 91.  Platelets 259. Cholesterol 109, triglyceride 119, HDL 24, LDL 61.

## 2017-08-07 ENCOUNTER — Encounter: Payer: Self-pay | Admitting: *Deleted

## 2017-08-07 NOTE — Patient Outreach (Signed)
[facility]) Care Management  08/07/2017  Dale Gonzalez 20-Mar-1951 [id number]  Late entry-Return call to patient after receiving voice mail from patient. Patient had questions regarding [facility] care management services. HIPPA verification received.   Patient voices that he is currently under doctor's care and does not need services at this time. States he will call back if services needed in the future.   Plan: Close case. MD closure letter.  Sherrin Daisy, RN BSN [place] Management Coordinator [facility] Care Management  [phone]

## 2017-08-08 ENCOUNTER — Ambulatory Visit (HOSPITAL_COMMUNITY)
Admission: RE | Admit: 2017-08-08 | Discharge: 2017-08-09 | Disposition: A | Discharge status: Home / Self Care | Payer: PPO | Source: Ambulatory Visit | Attending: Cardiology | Admitting: Cardiology

## 2017-08-08 ENCOUNTER — Other Ambulatory Visit: Payer: Self-pay

## 2017-08-08 ENCOUNTER — Encounter (HOSPITAL_COMMUNITY)
Admission: RE | Disposition: A | Discharge status: Home / Self Care | Payer: Self-pay | Source: Ambulatory Visit | Attending: Cardiology

## 2017-08-08 ENCOUNTER — Encounter (HOSPITAL_COMMUNITY): Payer: Self-pay | Admitting: *Deleted

## 2017-08-08 DIAGNOSIS — I2510 Atherosclerotic heart disease of native coronary artery without angina pectoris: Secondary | ICD-10-CM | POA: Insufficient documentation

## 2017-08-08 DIAGNOSIS — Z79899 Other long term (current) drug therapy: Secondary | ICD-10-CM | POA: Diagnosis not present

## 2017-08-08 DIAGNOSIS — Z7982 Long term (current) use of aspirin: Secondary | ICD-10-CM | POA: Diagnosis not present

## 2017-08-08 DIAGNOSIS — Z955 Presence of coronary angioplasty implant and graft: Secondary | ICD-10-CM

## 2017-08-08 DIAGNOSIS — I252 Old myocardial infarction: Secondary | ICD-10-CM | POA: Insufficient documentation

## 2017-08-08 DIAGNOSIS — Z7902 Long term (current) use of antithrombotics/antiplatelets: Secondary | ICD-10-CM | POA: Insufficient documentation

## 2017-08-08 DIAGNOSIS — Z9861 Coronary angioplasty status: Secondary | ICD-10-CM | POA: Diagnosis present

## 2017-08-08 DIAGNOSIS — E785 Hyperlipidemia, unspecified: Secondary | ICD-10-CM | POA: Diagnosis not present

## 2017-08-08 DIAGNOSIS — I10 Essential (primary) hypertension: Secondary | ICD-10-CM | POA: Insufficient documentation

## 2017-08-08 DIAGNOSIS — I25118 Atherosclerotic heart disease of native coronary artery with other forms of angina pectoris: Secondary | ICD-10-CM | POA: Diagnosis not present

## 2017-08-08 DIAGNOSIS — I25810 Atherosclerosis of coronary artery bypass graft(s) without angina pectoris: Secondary | ICD-10-CM

## 2017-08-08 HISTORY — DX: Acute myocardial infarction, unspecified: I21.9

## 2017-08-08 HISTORY — DX: Other seasonal allergic rhinitis: J30.2

## 2017-08-08 HISTORY — PX: LEFT HEART CATH AND CORONARY ANGIOGRAPHY: CATH118249

## 2017-08-08 HISTORY — PX: CORONARY STENT INTERVENTION: CATH118234

## 2017-08-08 HISTORY — PX: INTRAVASCULAR ULTRASOUND/IVUS: CATH118244

## 2017-08-08 LAB — POCT ACTIVATED CLOTTING TIME
ACTIVATED CLOTTING TIME: 444 s
ACTIVATED CLOTTING TIME: 230 s
ACTIVATED CLOTTING TIME: 290 s
ACTIVATED CLOTTING TIME: 279 s
ACTIVATED CLOTTING TIME: 450 s
Activated Clotting Time: 296 seconds
Activated Clotting Time: 626 seconds

## 2017-08-08 LAB — PROTIME-INR
INR: 1.06
Prothrombin Time: 13.7 seconds (ref 11.4–15.2)

## 2017-08-08 SURGERY — LEFT HEART CATH AND CORONARY ANGIOGRAPHY
Anesthesia: LOCAL

## 2017-08-08 MED ORDER — FENTANYL CITRATE (PF) 100 MCG/2ML IJ SOLN
INTRAMUSCULAR | Status: AC
  Filled 2017-08-08: qty 2

## 2017-08-08 MED ORDER — NITROGLYCERIN IN D5W 200-5 MCG/ML-% IV SOLN
INTRAVENOUS | Status: AC | PRN
  Administered 2017-08-08: 10 ug/min via INTRAVENOUS

## 2017-08-08 MED ORDER — NITROGLYCERIN 1 MG/10 ML FOR IR/CATH LAB
INTRA_ARTERIAL | Status: DC | PRN
  Administered 2017-08-08: 200 ug via INTRACORONARY
  Administered 2017-08-08: 200 ug
  Administered 2017-08-08: 200 ug via INTRACORONARY

## 2017-08-08 MED ORDER — ASPIRIN EC 81 MG PO TBEC
81.0000 mg | DELAYED_RELEASE_TABLET | Freq: Every day | ORAL | Status: DC
  Administered 2017-08-09: 81 mg via ORAL
  Filled 2017-08-08: qty 1

## 2017-08-08 MED ORDER — SODIUM CHLORIDE 0.9% FLUSH
3.0000 mL | Freq: Two times a day (BID) | INTRAVENOUS | Status: DC
  Administered 2017-08-08 (×2): 3 mL via INTRAVENOUS

## 2017-08-08 MED ORDER — LIDOCAINE HCL (PF) 1 % IJ SOLN
INTRAMUSCULAR | Status: DC | PRN
  Administered 2017-08-08: 5 mL

## 2017-08-08 MED ORDER — TICAGRELOR 90 MG PO TABS
90.0000 mg | ORAL_TABLET | Freq: Two times a day (BID) | ORAL | Status: DC
  Administered 2017-08-08 – 2017-08-09 (×2): 90 mg via ORAL
  Filled 2017-08-08 (×2): qty 1

## 2017-08-08 MED ORDER — ONDANSETRON HCL 4 MG/2ML IJ SOLN: 4.0000 mg | Freq: Four times a day (QID) | INTRAMUSCULAR | Status: DC | PRN

## 2017-08-08 MED ORDER — IOPAMIDOL (ISOVUE-370) INJECTION 76%
INTRAVENOUS | Status: DC | PRN
  Administered 2017-08-08: 265 mL via INTRA_ARTERIAL

## 2017-08-08 MED ORDER — SODIUM CHLORIDE 0.9 % IV SOLN: 250.0000 mL | INTRAVENOUS | Status: DC | PRN

## 2017-08-08 MED ORDER — MIDAZOLAM HCL 2 MG/2ML IJ SOLN
INTRAMUSCULAR | Status: AC
  Filled 2017-08-08: qty 2

## 2017-08-08 MED ORDER — LABETALOL HCL 5 MG/ML IV SOLN: 10.0000 mg | INTRAVENOUS | Status: AC | PRN

## 2017-08-08 MED ORDER — SODIUM CHLORIDE 0.9 % IV SOLN
INTRAVENOUS | Status: AC
  Administered 2017-08-08: 12:00:00 via INTRAVENOUS

## 2017-08-08 MED ORDER — ANGIOPLASTY BOOK
Freq: Once | Status: AC
  Administered 2017-08-09: 06:00:00
  Filled 2017-08-08: qty 1

## 2017-08-08 MED ORDER — VERAPAMIL HCL 2.5 MG/ML IV SOLN
INTRAVENOUS | Status: AC
  Filled 2017-08-08: qty 2

## 2017-08-08 MED ORDER — SODIUM CHLORIDE 0.9% FLUSH: 3.0000 mL | INTRAVENOUS | Status: DC | PRN

## 2017-08-08 MED ORDER — FENTANYL CITRATE (PF) 100 MCG/2ML IJ SOLN
INTRAMUSCULAR | Status: AC
Start: 2017-08-08 — End: ?
  Filled 2017-08-08: qty 2

## 2017-08-08 MED ORDER — MIDAZOLAM HCL 2 MG/2ML IJ SOLN
INTRAMUSCULAR | Status: DC | PRN
  Administered 2017-08-08 (×4): 1 mg via INTRAVENOUS

## 2017-08-08 MED ORDER — SODIUM CHLORIDE 0.9 % IV SOLN
INTRAVENOUS | Status: DC
  Administered 2017-08-08: 250 mL via INTRAVENOUS
  Administered 2017-08-08: 07:00:00 via INTRAVENOUS
  Administered 2017-08-08: 250 mL via INTRAVENOUS

## 2017-08-08 MED ORDER — LIDOCAINE HCL (PF) 1 % IJ SOLN
INTRAMUSCULAR | Status: AC
  Filled 2017-08-08: qty 30

## 2017-08-08 MED ORDER — ATORVASTATIN CALCIUM 80 MG PO TABS
80.0000 mg | ORAL_TABLET | Freq: Every day | ORAL | Status: DC
  Administered 2017-08-08: 19:00:00 80 mg via ORAL
  Filled 2017-08-08: qty 1

## 2017-08-08 MED ORDER — HEPARIN SODIUM (PORCINE) 1000 UNIT/ML IJ SOLN
INTRAMUSCULAR | Status: AC
  Filled 2017-08-08: qty 1

## 2017-08-08 MED ORDER — ALBUTEROL SULFATE (2.5 MG/3ML) 0.083% IN NEBU: 3.0000 mL | INHALATION_SOLUTION | RESPIRATORY_TRACT | Status: DC | PRN

## 2017-08-08 MED ORDER — NITROGLYCERIN 1 MG/10 ML FOR IR/CATH LAB
INTRA_ARTERIAL | Status: AC
  Filled 2017-08-08: qty 10

## 2017-08-08 MED ORDER — SODIUM CHLORIDE 0.9% FLUSH: 3.0000 mL | Freq: Two times a day (BID) | INTRAVENOUS | Status: DC

## 2017-08-08 MED ORDER — HEPARIN SODIUM (PORCINE) 1000 UNIT/ML IJ SOLN
INTRAMUSCULAR | Status: DC | PRN
  Administered 2017-08-08: 2000 [IU] via INTRAVENOUS
  Administered 2017-08-08: 3000 [IU] via INTRAVENOUS
  Administered 2017-08-08: 2000 [IU] via INTRAVENOUS
  Administered 2017-08-08: 3000 [IU] via INTRAVENOUS
  Administered 2017-08-08: 7000 [IU] via INTRAVENOUS

## 2017-08-08 MED ORDER — ACETAMINOPHEN 325 MG PO TABS: 650.0000 mg | ORAL_TABLET | ORAL | Status: DC | PRN

## 2017-08-08 MED ORDER — NITROGLYCERIN IN D5W 200-5 MCG/ML-% IV SOLN
INTRAVENOUS | Status: AC
  Filled 2017-08-08: qty 250

## 2017-08-08 MED ORDER — HEPARIN (PORCINE) IN NACL 2-0.9 UNIT/ML-% IJ SOLN
INTRAMUSCULAR | Status: AC
  Filled 2017-08-08: qty 1000

## 2017-08-08 MED ORDER — VERAPAMIL HCL 2.5 MG/ML IV SOLN
INTRA_ARTERIAL | Status: DC | PRN
  Administered 2017-08-08 (×3): 5 mL via INTRA_ARTERIAL

## 2017-08-08 MED ORDER — ASPIRIN 81 MG PO CHEW: 81.0000 mg | CHEWABLE_TABLET | ORAL | Status: DC

## 2017-08-08 MED ORDER — METOPROLOL TARTRATE 12.5 MG HALF TABLET
12.5000 mg | ORAL_TABLET | Freq: Two times a day (BID) | ORAL | Status: DC
  Administered 2017-08-08 – 2017-08-09 (×2): 12.5 mg via ORAL
  Filled 2017-08-08 (×2): qty 1

## 2017-08-08 MED ORDER — HEPARIN (PORCINE) IN NACL 2-0.9 UNIT/ML-% IJ SOLN
INTRAMUSCULAR | Status: AC | PRN
  Administered 2017-08-08: 500 mL
  Administered 2017-08-08: 1000 mL

## 2017-08-08 MED ORDER — IOPAMIDOL (ISOVUE-370) INJECTION 76%
INTRAVENOUS | Status: AC
  Filled 2017-08-08: qty 125

## 2017-08-08 MED ORDER — HYDRALAZINE HCL 20 MG/ML IJ SOLN: 5.0000 mg | INTRAMUSCULAR | Status: AC | PRN

## 2017-08-08 MED ORDER — FENTANYL CITRATE (PF) 100 MCG/2ML IJ SOLN
INTRAMUSCULAR | Status: DC | PRN
  Administered 2017-08-08 (×4): 50 ug via INTRAVENOUS

## 2017-08-08 MED ORDER — IOPAMIDOL (ISOVUE-370) INJECTION 76%
INTRAVENOUS | Status: AC
  Filled 2017-08-08: qty 100

## 2017-08-08 SURGICAL SUPPLY — 35 items
BALLN EMERGE MR 2.5X12 (BALLOONS) ×2
BALLN EMERGE MR 3.0X15 (BALLOONS) ×2
BALLN EMERGE MR PUSH 1.5X12 (BALLOONS) ×2
BALLN ~~LOC~~ EMERGE MR 2.0X12 (BALLOONS) ×2
BALLN ~~LOC~~ EMERGE MR 2.75X12 (BALLOONS) ×2
BALLN ~~LOC~~ EMERGE MR 3.5X12 (BALLOONS) ×2
BALLOON EMERGE MR 2.5X12 (BALLOONS) ×1 IMPLANT
BALLOON EMERGE MR 3.0X15 (BALLOONS) ×1 IMPLANT
BALLOON EMERGE MR PUSH 1.5X12 (BALLOONS) ×1 IMPLANT
BALLOON ~~LOC~~ EMERGE MR 2.0X12 (BALLOONS) ×1 IMPLANT
BALLOON ~~LOC~~ EMERGE MR 2.75X12 (BALLOONS) ×1 IMPLANT
BALLOON ~~LOC~~ EMERGE MR 3.5X12 (BALLOONS) ×1 IMPLANT
CATH INFINITI 5FR ANG PIGTAIL (CATHETERS) ×2 IMPLANT
CATH LAUNCHER 6FR EBU3.5 (CATHETERS) ×2 IMPLANT
CATH MICRO ASAHI CORSAIR 150CM (MICROCATHETER) ×2 IMPLANT
CATH OPTICROSS 40MHZ (CATHETERS) ×2 IMPLANT
CATH SUPERCROSS ANGLED 90 DEG (MICROCATHETER) ×2 IMPLANT
CATH VISTA GUIDE 6FR AL1 (CATHETERS) ×2 IMPLANT
DEVICE RAD COMP TR BAND LRG (VASCULAR PRODUCTS) ×2 IMPLANT
GLIDESHEATH SLEND A-KIT 6F 22G (SHEATH) ×2 IMPLANT
GUIDELINER 6F (CATHETERS) ×2 IMPLANT
GUIDEWIRE INQWIRE 1.5J.035X260 (WIRE) ×1 IMPLANT
INQWIRE 1.5J .035X260CM (WIRE) ×2
KIT ENCORE 26 ADVANTAGE (KITS) ×4 IMPLANT
KIT HEART LEFT (KITS) ×2 IMPLANT
PACK CARDIAC CATHETERIZATION (CUSTOM PROCEDURE TRAY) ×2 IMPLANT
SLED PULL BACK IVUS (MISCELLANEOUS) ×2 IMPLANT
STENT SYNERGY DES 2.5X16 (Permanent Stent) ×2 IMPLANT
STENT SYNERGY DES 2.75X38 (Permanent Stent) ×2 IMPLANT
STENT SYNERGY DES 3.5X20 (Permanent Stent) ×2 IMPLANT
TRANSDUCER W/STOPCOCK (MISCELLANEOUS) ×2 IMPLANT
TUBING CIL FLEX 10 FLL-RA (TUBING) ×2 IMPLANT
WIRE ASAHI PROWATER 180CM (WIRE) ×4 IMPLANT
WIRE GUIDE ASAHI EXTENSION 165 (WIRE) ×2 IMPLANT
WIRE HI TORQ BMW 190CM (WIRE) ×4 IMPLANT

## 2017-08-08 NOTE — Progress Notes (Signed)
TR BAND REMOVAL  LOCATION:    right radial  DEFLATED PER PROTOCOL:    Yes.    TIME BAND OFF / DRESSING APPLIED:    1530   SITE UPON ARRIVAL:    Level 0  SITE AFTER BAND REMOVAL:    Level 0  CIRCULATION SENSATION AND MOVEMENT:    Within Normal Limits   Yes.    COMMENTS:   Tolerated procedure well 

## 2017-08-08 NOTE — Interval H&P Note (Signed)
History and Physical Interval Note:  08/08/2017 7:30 AM  Dale Gonzalez  has presented today for surgery, with the diagnosis of Chest Pain  The various methods of treatment have been discussed with the patient and family. After consideration of risks, benefits and other options for treatment, the patient has consented to  Procedure(s): LEFT HEART CATH AND CORONARY ANGIOGRAPHY (N/A) as a surgical intervention .  The patient's history has been reviewed, patient examined, no change in status, stable for surgery.  I have reviewed the patient's chart and labs.  Questions were answered to the patient's satisfaction.      2016/2017 Appropriate Use Criteria for Coronary Revascularization Clinical Presentation: Diabetes Mellitus? Symptom Status? S/P CABG? Antianginal Therapy (# of long-acting drugs)? Results of Non-invasive testing? FFR/iFR results in all diseased vessels? Patient undergoing renal transplant? Patient undergoing percutaneous valve procedure (TAVR, MitraClip, Others)? Symptom Status:  Ischemic Symptoms  Non-invasive Testing:  Not done  If no or indeterminate stress test, FFR/iFR results in all diseased vessels:  Abnormal  Diabetes Mellitus:  No  S/P CABG:  No  Antianginal therapy (number of long-acting drugs):  1  Patient undergoing renal transplant:  No  Patient undergoing percutaneous valve procedure:  No    newline 1 Vessel Disease PCI CABG  No proximal LAD involvement, No proximal left dominant LCX involvement M (6); Indication 3 M (4); Indication 3   Proximal left dominant LCX involvement M (6); Indication 6 M (6); Indication 6   Proximal LAD involvement M (6); Indication 6 M (6); Indication 6   newline 2 Vessel Disease  No proximal LAD involvement A (7); Indication 9 M (5); Indication 9   Proximal LAD involvement A (7); Indication 12 A (7); Indication 12   newline 3 Vessel Disease  Low disease complexity (e.g., focal stenoses, SYNTAX <=22) Not rated Not rated     Intermediate or high disease complexity (e.g., SYNTAX >=23) Not rated Not rated   newline Left Main Disease  Isolated LMCA disease: ostial or midshaft A (7); Indication 24 A (9); Indication 24   Isolated LMCA disease: bifurcation involvement M (5); Indication 25 A (9); Indication 25   LMCA ostial or midshaft, concurrent low disease burden multivessel disease (e.g., 1-2 additional focal stenoses, SYNTAX <=22) A (7); Indication 26 A (9); Indication 26   LMCA ostial or midshaft, concurrent intermediate or high disease burden multivessel disease (e.g., 1-2 additional bifurcation stenoses, long stenoses, SYNTAX >=23) M (4); Indication 27 A (9); Indication 27   LMCA bifurcation involvement, concurrent low disease burden multivessel disease (e.g., 1-2 additional focal stenoses, SYNTAX <=22) M (5); Indication 28 A (9); Indication 28   LMCA bifurcation involvement, concurrent intermediate or high disease burden multivessel disease (e.g., 1-2 additional bifurcation stenoses, long stenoses, SYNTAX >=23) R (3); Indication 29 A (9); Indication 29        Please note that while FFR was not actually performed, this is complete revascularization post culprit vessel revascularization in the setting of STEMI 12 days ago. Patient has severe LAD and RCA disease, for which he has opted to undergo complete revascularization for.   [place]

## 2017-08-08 NOTE — Progress Notes (Signed)
Complex LAD/Diag bifurcation PCI. Full report to follow. Admit to 6 central ASA/brilinta IC NTG overnight.  Nigel Mormon, MD [facility] Pager: [phone] Office: [phone] If no answer Cell [phone]

## 2017-08-08 NOTE — H&P (Addendum)
Dale Gonzalez 08/15/2017 11:30 AM Location: [facility] Patient #: [phone] DOB: 03/01/1951 Married / Language: [name] / Race: White Male   History of Present Illness [name] Shani Fitch MD; Aug 15, 2017 1:26 PM) Patient words: NP EVAL for cad, s/p ptca.  The patient is a 66 year old male, accompanied by his wife during the visit, who presents with coronary artery disease. Patient referred to [place] on a stat basis by Dr. Osborne Casco.  Very pleasant and physically active 66 year old Caucasian male with no prior history, had STEMI on 07/27/2017 with completely occluded OM1 branch. He underwent primary PCI with 2 overlapping stents synergy 2.25 x 38 mm and 2.25 x 12 mm. He has severe, non-culprit coronary artery disease in proximal and mid LAD as well as dominant but small caliber proximal and mid right coronary artery. He was started on dual antiplatelet therapy with aspirin and Brilinta, metoprolol 12.5 mg twice daily, and Lipitor. He was recommended to undergo complete revascularization on returning to [place].  Patient has not resumed physical activity since his MI. However, the other day when he tried to perform some exercise he had short lasting central chest tightness. Patient is very physically active at baseline and and would like to get back to his routine of spin classes.  I have personally reviewed his catheter films from [facility] in [place].   Problem List/Past Medical [name]; August 15, 2017 11:23 AM) Laboratory examination (Z01.89)  History of MI (myocardial infarction) (I25.2) [07/27/2017]:  Allergies [name]; 2017/08/15 11:25 AM) No Known Drug Allergies [15-Aug-2017]:  Family History [name]; 2017/08/15 11:26 AM) Mother  In stable health. BP, no heart issues Father  Deceased. at age 60; bypass, bladder cancer Brother 2  1 older; 1 younger; no heart issues  Social History [name]; August 15, 2017 11:27  AM) Current tobacco use  Never smoker. Marital status  Married. Alcohol Use  Occasional alcohol use. vodka Living Situation  Lives with spouse. Number of Children  2.  Past Surgical History Frances Furbish [name]; 2017/08/15 11:27 AM) None [August 15, 2017]:  Medication History [name]; 08-15-17 11:30 AM) Atorvastatin Calcium (80MG  Tablet, 1 Oral daily) Active. Brilinta (90MG  Tablet, 1 Oral every 12 hours) Active. Metoprolol Tartrate (25MG  Tablet, 1/2 Oral two times daily) Active. Aspirin (81MG  Capsule, 1 Oral daily) Active. Medications Reconciled (list present)  Diagnostic Studies History [name]; August 15, 2017 11:24 AM) Coronary Angiogram [07/27/2017]:  Vitals [name]; 08/15/17 11:32 AM) 2017-08-15 11:21 AM Weight: 171.44 lb Height: 67in Body Surface Area: 1.89 m Body Mass Index: 26.85 kg/m  Pulse: 60 (Regular)  P.OX: 98% (Room air) BP: 121/78 (Sitting, Left Arm, Standard)  Review of Systems  Constitutional: Negative.  Negative for weight loss.  HENT: Negative.   Respiratory: Negative for shortness of breath.   Cardiovascular: Negative for chest pain (On exertion), palpitations and leg swelling.  Gastrointestinal: Negative for heartburn.  Genitourinary: Negative.   Musculoskeletal: Negative.   Neurological: Negative for dizziness and loss of consciousness.  Endo/Heme/Allergies: Does not bruise/bleed easily.  Psychiatric/Behavioral: Negative for depression.  All other systems reviewed and are negative.   Blood pressure 120/82, temperature 97.9 F (36.6 C), temperature source Oral, height 5\' 7"  (1.702 m), weight 77.6 kg (171 lb), SpO2 99 %. Physical Exam  Nursing note and vitals reviewed. Constitutional: He is oriented to person, place, and time. He appears well-developed and well-nourished.  HENT:  Head: Normocephalic and atraumatic.  Eyes: Pupils are equal, round, and reactive to light.  Neck: Normal range of motion. Neck  supple. No JVD present.  Cardiovascular: Normal rate, regular rhythm and normal heart sounds.  No murmur heard. Respiratory: Effort normal. He has no wheezes. He has no rales.  GI: Soft. Bowel sounds are normal. He exhibits no distension. There is no tenderness.  Musculoskeletal: He exhibits no edema.  Lymphadenopathy:    He has no cervical adenopathy.  Neurological: He is alert and oriented to person, place, and time. He has normal reflexes.  Skin: Skin is warm and dry.       Assessment & Plan [facility] Meeah Totino MD; 08/06/2017 1:25 PM) History of MI (myocardial infarction) (I25.2) Story: EKG 08/06/2017: Sinus bradycardia 58 bpm. Left axis deviation. Old inferior infarct. Poor R-wave progression. Low voltage. Current Plans Complete electrocardiogram (93000) Note:Recommendations:  66 year old Caucasian male with recent STEMI status post successful primary PCI to OM1. Vesicular non-culprit severe disease LAD and RCA. I discussed options of continued medical management versus PCI. Patient would like to proceed with PCI. I will perform this on Thursday 08/07/2017. He may need atherectomy to LAD. Subject to the amount of contrast used, I may also perform PCI to his proximal and mid RCA.After the PCI, I recommend starting him on low-dose ACE inhibitor true prevent adverse remodeling.  Cc Domenick Gong, MD  Signed electronically by Vernell Leep, MD (08/06/2017 1:26 PM)

## 2017-08-08 NOTE — Care Management Note (Signed)
Case Management Note  Patient Details  Name: Dale Gonzalez MRN: [id number] Date of Birth: 1951/02/03  Subjective/Objective:  From home, s/p stent intervention,will be on brilinta, NCM awaiting benefit check for brilinta.                   Action/Plan: NCM will follow for dc needs.   Expected Discharge Date:                  Expected Discharge Plan:  Home/Self Care  In-House Referral:     Discharge planning Services  CM Consult, Medication Assistance  Post Acute Care Choice:    Choice offered to:     DME Arranged:    DME Agency:     HH Arranged:    HH Agency:     Status of Service:  In process, will continue to follow  If discussed at Long Length of Stay Meetings, dates discussed:    Additional Comments:  Zenon Mayo, RN 08/08/2017, 2:37 PM

## 2017-08-09 ENCOUNTER — Ambulatory Visit (HOSPITAL_COMMUNITY): Payer: PPO

## 2017-08-09 ENCOUNTER — Encounter (HOSPITAL_COMMUNITY): Payer: Self-pay | Admitting: Cardiology

## 2017-08-09 DIAGNOSIS — I2510 Atherosclerotic heart disease of native coronary artery without angina pectoris: Secondary | ICD-10-CM | POA: Diagnosis not present

## 2017-08-09 DIAGNOSIS — Z79899 Other long term (current) drug therapy: Secondary | ICD-10-CM | POA: Diagnosis not present

## 2017-08-09 DIAGNOSIS — I252 Old myocardial infarction: Secondary | ICD-10-CM | POA: Diagnosis not present

## 2017-08-09 DIAGNOSIS — Z955 Presence of coronary angioplasty implant and graft: Secondary | ICD-10-CM | POA: Diagnosis not present

## 2017-08-09 DIAGNOSIS — Z7902 Long term (current) use of antithrombotics/antiplatelets: Secondary | ICD-10-CM | POA: Diagnosis not present

## 2017-08-09 DIAGNOSIS — Z7982 Long term (current) use of aspirin: Secondary | ICD-10-CM | POA: Diagnosis not present

## 2017-08-09 DIAGNOSIS — I25118 Atherosclerotic heart disease of native coronary artery with other forms of angina pectoris: Secondary | ICD-10-CM | POA: Diagnosis not present

## 2017-08-09 DIAGNOSIS — E785 Hyperlipidemia, unspecified: Secondary | ICD-10-CM | POA: Diagnosis not present

## 2017-08-09 DIAGNOSIS — I10 Essential (primary) hypertension: Secondary | ICD-10-CM | POA: Diagnosis not present

## 2017-08-09 LAB — BASIC METABOLIC PANEL
ANION GAP: 9 (ref 5–15)
BUN: 13 mg/dL (ref 6–20)
CALCIUM: 9.3 mg/dL (ref 8.9–10.3)
CO2: 24 mmol/L (ref 22–32)
Chloride: 104 mmol/L (ref 101–111)
Creatinine, Ser: 0.95 mg/dL (ref 0.61–1.24)
GLUCOSE: 100 mg/dL — AB (ref 65–99)
Potassium: 3.9 mmol/L (ref 3.5–5.1)
Sodium: 137 mmol/L (ref 135–145)

## 2017-08-09 LAB — ECHOCARDIOGRAM COMPLETE
CHL CUP DOP CALC LVOT VTI: 22.4 cm
CHL CUP MV DEC (S): 222
CHL CUP STROKE VOLUME: 33 mL
E/e' ratio: 6.2
EWDT: 222 ms
FS: 28 % (ref 28–44)
Height: 67 in
IVS/LV PW RATIO, ED: 1.1
LA diam end sys: 36 mm
LA diam index: 1.89 cm/m2
LA vol A4C: 41.2 ml
LASIZE: 36 mm
LAVOL: 42.9 mL
LAVOLIN: 22.5 mL/m2
LDCA: 4.52 cm2
LV E/e' medial: 6.2
LV PW d: 10 mm — AB (ref 0.6–1.1)
LV e' LATERAL: 10.4 cm/s
LV sys vol index: 22 mL/m2
LV sys vol: 43 mL (ref 21–61)
LVDIAVOL: 76 mL (ref 62–150)
LVDIAVOLIN: 40 mL/m2
LVEEAVG: 6.2
LVOT SV: 101 mL
LVOT peak grad rest: 5 mmHg
LVOTD: 24 mm
LVOTPV: 112 cm/s
MV pk A vel: 66.9 m/s
MV pk E vel: 64.5 m/s
Simpson's disk: 44
TDI e' lateral: 10.4
TDI e' medial: 7.18
WEIGHTICAEL: 2680.79 [oz_av]

## 2017-08-09 LAB — CBC
HCT: 45.9 % (ref 39.0–52.0)
HEMOGLOBIN: 15.5 g/dL (ref 13.0–17.0)
MCH: 30.2 pg (ref 26.0–34.0)
MCHC: 33.8 g/dL (ref 30.0–36.0)
MCV: 89.3 fL (ref 78.0–100.0)
Platelets: 314 10*3/uL (ref 150–400)
RBC: 5.14 MIL/uL (ref 4.22–5.81)
RDW: 13 % (ref 11.5–15.5)
WBC: 12.2 10*3/uL — ABNORMAL HIGH (ref 4.0–10.5)

## 2017-08-09 MED ORDER — LISINOPRIL 10 MG PO TABS
10.0000 mg | ORAL_TABLET | Freq: Every day | ORAL | Status: DC
  Administered 2017-08-09: 10 mg via ORAL
  Filled 2017-08-09: qty 1

## 2017-08-09 MED ORDER — LISINOPRIL 10 MG PO TABS: 10.0000 mg | ORAL_TABLET | Freq: Every day | ORAL | 3 refills | Status: DC

## 2017-08-09 MED ORDER — NITROGLYCERIN 0.4 MG SL SUBL: 0.4000 mg | SUBLINGUAL_TABLET | SUBLINGUAL | 3 refills | Status: DC | PRN

## 2017-08-09 NOTE — Progress Notes (Signed)
CARDIAC REHAB PHASE I   PRE:  Rate/Rhythm: 79 SR  BP:  Supine: 157/84  Sitting:   Standing:    SaO2:   MODE:  Ambulation: 800 ft   POST:  Rate/Rhythm: 89 SR  BP:  Supine: 168/87  Sitting:   Standing:    SaO2:  0805- 0830   [id number] Started to ed pt after walking and ECHO arrived. Came back after ECHO and finished ed. Pt walked 800 ft with steady gait and no CP. Tolerated well. Education completed with pt who voiced understanding. Stressed importance of brilinta with stent. Gave pt MI booklet since he recently had MI. Reviewed MI restrictions, NTG use, ex ed and gave heart healthy diet. Discussed CRP 2 and referring to [place] program.   Graylon Good, RN BSN  08/09/2017 9:41 AM

## 2017-08-09 NOTE — Discharge Summary (Signed)
Physician Discharge Summary  Patient ID: Dale Gonzalez MRN: [id number] DOB/AGE: [id number] 67 y.o.  Admit date: 08/08/2017 Discharge date: 08/09/2017  Admission Diagnoses: CAD  Discharge Diagnoses:  Active Problems:   CAD (coronary artery disease) of artery bypass graft   Post PTCA   Discharged Condition: good  Hospital Course: Patient underwent successful LAD diagonal bifurcation stenting for severe coronary artery disease status post recent STEMI and primary PCI to OM1 branch.  He was observed overnight.  He had no exertional chest pain and was able to ambulate without any difficulty.  He is referred to cardiac rehabilitation.  Echocardiogram showed mildly reduced EF 45-50% with inferolateral kinesis.  He was started on lisinopril 10 mg.  He will continue dual antiplatelet therapy with aspirin and Brilinta for at least 1 year.  Continue metoprolol 12.5 mg twice a day, and Lipitor 80 mg daily.  Consults: cardiology  Significant Diagnostic Studies: labs:  Results for Dale Gonzalez (MRN [id number]) as of 08/09/2017 10:09  Ref. Range 08/09/2017 04:58  WBC Latest Ref Range: 4.0 - 10.5 K/uL 12.2 (H)  RBC Latest Ref Range: 4.22 - 5.81 MIL/uL 5.14  Hemoglobin Latest Ref Range: 13.0 - 17.0 g/dL 15.5  HCT Latest Ref Range: 39.0 - 52.0 % 45.9  MCV Latest Ref Range: 78.0 - 100.0 fL 89.3  MCH Latest Ref Range: 26.0 - 34.0 pg 30.2  MCHC Latest Ref Range: 30.0 - 36.0 g/dL 33.8  RDW Latest Ref Range: 11.5 - 15.5 % 13.0  Platelets Latest Ref Range: 150 - 400 K/uL 314   Results for Dale Gonzalez (MRN [id number]) as of 08/09/2017 10:09  Ref. Range 08/09/2017 10:93  BASIC METABOLIC PANEL Unknown Rpt (A)  Sodium Latest Ref Range: 135 - 145 mmol/L 137  Potassium Latest Ref Range: 3.5 - 5.1 mmol/L 3.9  Chloride Latest Ref Range: 101 - 111 mmol/L 104  CO2 Latest Ref Range: 22 - 32 mmol/L 24  Glucose Latest Ref Range: 65 - 99 mg/dL 100 (H)  BUN Latest Ref Range: 6 - 20 mg/dL 13  Creatinine  Latest Ref Range: 0.61 - 1.24 mg/dL 0.95  Calcium Latest Ref Range: 8.9 - 10.3 mg/dL 9.3  Anion gap Latest Ref Range: 5 - 15  9  GFR, Est Non African American Latest Ref Range: >60 mL/min >60  GFR, Est African American Latest Ref Range: >60 mL/min >60    Echocardiogram 08/09/2017: Study Conclusions  - Left ventricle: The cavity size was normal. The estimated   ejection fraction was in the range of 45% to 50%. Mild   inferolateral hypokinesis. - Mitral valve: There was mild regurgitation. - Normal right atrial pressure.  Treatments:  Procedures   CORONARY STENT INTERVENTION  Intravascular Ultrasound/IVUS  LEFT HEART CATH AND CORONARY ANGIOGRAPHY  Conclusion     The left ventricular ejection fraction is 50-55% by visual estimate.    Complex LAD/Dig bifurcation PTCA and stenting  Diag 2 2.5 X 16 mm Synergy DES  Mid LAD 2.75 X 38 mm Synergy DES  Prox LAD 3.5 X 20 mm Synergy DES Known residual moderate disease in small caliber RCA Inferolateral hypokinesis, EF 50-55%   Recommendation: ASA 81 mg daily lifelong and brilitna 90 mg bid for at least 1 year  Aggressive risk factor modification and medical therapy Referral to cardiac rehabilitation     Discharge Exam: Blood pressure (!) 157/84, pulse 87, temperature 97.7 F (36.5 C), temperature source Oral, resp. rate 18, height 5\' 7"  (1.702 m), weight 76 kg (167 lb  8.8 oz), SpO2 97 %. Nursing note and vitals reviewed. Constitutional: He is oriented to person, place, and time. He appears well-developed and well-nourished.  HENT:  Head: Normocephalic and atraumatic.  Eyes: Pupils are equal, round, and reactive to light.  Neck: Normal range of motion. Neck supple. No JVD present.  Cardiovascular: Normal rate, regular rhythm and normal heart sounds.  No murmur heard. Right radial site with no bleeding, hematoma. Good capillary refill.   Respiratory: Effort normal. He has no wheezes. He has no rales.  GI: Soft. Bowel  sounds are normal. He exhibits no distension. There is no tenderness.  Musculoskeletal: He exhibits no edema.  Lymphadenopathy:    He has no cervical adenopathy.  Neurological: He is alert and oriented to person, place, and time. He has normal reflexes.  Skin: Skin is warm and dry.   Disposition: 01-Home or Self Care  Discharge Instructions    AMB Referral to Cardiac Rehabilitation - Phase II   Complete by:  As directed    Diagnosis:  PTCA   Amb Referral to Cardiac Rehabilitation   Complete by:  As directed    Diagnosis:  Coronary Stents Comment - recent MI in [place] - low sodium heart healthy   Complete by:  As directed    Increase activity slowly   Complete by:  As directed      Allergies as of 08/09/2017      Reactions   Bee Venom Swelling      Medication List    TAKE these medications   albuterol 108 (90 Base) MCG/ACT inhaler Commonly known as:  PROVENTIL HFA;VENTOLIN HFA Inhale 2 puffs into the lungs every 4 (four) hours as needed for wheezing or shortness of breath.   aspirin EC 81 MG tablet Take 81 mg by mouth daily.   atorvastatin 80 MG tablet Commonly known as:  LIPITOR Take 80 mg by mouth daily.   beclomethasone 80 MCG/ACT inhaler Commonly known as:  QVAR Inhale 1 puff into the lungs 2 (two) times daily. What changed:    when to take this  reasons to take this   EPINEPHrine 0.3 mg/0.3 mL Soaj injection Commonly known as:  EPIPEN Inject 0.3 mLs (0.3 mg total) into the muscle once.   lisinopril 10 MG tablet Commonly known as:  PRINIVIL,ZESTRIL Take 1 tablet (10 mg total) by mouth daily.   metoprolol tartrate 25 MG tablet Commonly known as:  LOPRESSOR Take 12.5 mg by mouth 2 (two) times daily.   nitroGLYCERIN 0.4 MG SL tablet Commonly known as:  NITROSTAT Place 1 tablet (0.4 mg total) under the tongue every 5 (five) minutes as needed for chest pain.   ticagrelor 90 MG Tabs tablet Commonly known as:  BRILINTA Take 90 mg by mouth 2  (two) times daily.        SignedNigel Mormon 08/09/2017, 10:10 AM   Nigel Mormon, MD [facility] Pager: [phone] Office: [phone] If no answer Cell [phone]

## 2017-08-09 NOTE — Progress Notes (Signed)
#   5.  S/W  [employer]IR[employer] @ [employer] # [phone] OPT- 2    BRILINTA 90 MG BID   COVER- YES  CO-PAY- $ 45.00  TIER- 3 DRUG  PRIOR APPROVAL- NO   PHARMACY : ANY RETAIL

## 2017-08-09 NOTE — Progress Notes (Signed)
  Echocardiogram 2D Echocardiogram has been performed. Dale Gonzalez 08/09/2017, 9:16 AM

## 2017-08-15 DIAGNOSIS — I252 Old myocardial infarction: Secondary | ICD-10-CM | POA: Diagnosis not present

## 2017-08-15 DIAGNOSIS — Z0189 Encounter for other specified special examinations: Secondary | ICD-10-CM | POA: Diagnosis not present

## 2017-08-16 ENCOUNTER — Telehealth (HOSPITAL_COMMUNITY): Payer: Self-pay

## 2017-08-16 NOTE — Telephone Encounter (Signed)
Patients insurance is active and benefits verified through [place] - $15.00 co-pay, no deductible, out of pocket amount of $3,400/$0.00 has been met, no co-insurance, and no pre-authorization is required. Reference [phone]  Patient will be contacted and scheduled after review by the RN Navigator.

## 2017-08-19 ENCOUNTER — Telehealth (HOSPITAL_COMMUNITY): Payer: Self-pay | Admitting: *Deleted

## 2017-08-19 NOTE — Telephone Encounter (Signed)
Pt left message earlier today regarding scheduling for cardiac rehab.  Called Dr. Virgina Jock office requesting office visit follow up notes to review for readiness to proceed with cardiac rehab.  Informed by Dr. Virgina Jock office that our request for follow up notes was received and would be processed at some point.  Informed Dr. Virgina Jock office staff that we would not be able to move forward with scheduling until the information was received.  Returned pt call and left message informing him that we are awaiting the information from Dr. Virgina Jock office.  Pt will be contacted for scheduling once the notes and referral are received.  Contact information provided. Cherre Huger, BSN Cardiac and Training and development officer

## 2017-08-21 ENCOUNTER — Telehealth (HOSPITAL_COMMUNITY): Payer: Self-pay

## 2017-08-21 NOTE — Telephone Encounter (Signed)
Called and spoke with patient in regards to Cardiac Rehab - Patient is interested in the program. Scheduled orientation on 09/19/2017 at 8:15am. Patient will attend the 8:15am exc class.

## 2017-09-03 DIAGNOSIS — Z0189 Encounter for other specified special examinations: Secondary | ICD-10-CM | POA: Diagnosis not present

## 2017-09-06 ENCOUNTER — Telehealth (HOSPITAL_COMMUNITY): Payer: Self-pay

## 2017-09-06 NOTE — Telephone Encounter (Signed)
Patient called and stated he would like to attend the 6:45am exc class.

## 2017-09-10 ENCOUNTER — Telehealth (HOSPITAL_COMMUNITY): Payer: Self-pay

## 2017-09-10 NOTE — Telephone Encounter (Signed)
Called to speak with patient to see if he would like to come in for Thursday orientation 09/12/2017. Patient is interested. Moved patients orientation to 09/12/2017 at 8:45am.

## 2017-09-11 NOTE — Progress Notes (Signed)
Cardiac Rehab Medication Review   Does the patient  feel that his/her medications are working for him/her?  YES   Has the patient been experiencing any side effects to the medications prescribed?  {YES NO  Does the patient measure his/her own blood pressure or blood glucose at home?  YES   Does the patient have any problems obtaining medications due to transportation or finances?    NO  Understanding of regimen: excellent Understanding of indications: excellent Potential of compliance: excellent     comments: Pt is well versed with medications and has a good understanding of how his medications work for him.    [place] RN 09/11/2017 4:46 PM

## 2017-09-12 ENCOUNTER — Encounter (HOSPITAL_COMMUNITY)
Admission: RE | Admit: 2017-09-12 | Discharge: 2017-09-12 | Disposition: A | Discharge status: Home / Self Care | Payer: PPO | Source: Ambulatory Visit | Attending: Cardiology | Admitting: Cardiology

## 2017-09-12 ENCOUNTER — Encounter (HOSPITAL_COMMUNITY): Payer: Self-pay

## 2017-09-12 VITALS — Ht 68.0 in | Wt 167.1 lb

## 2017-09-12 DIAGNOSIS — Z955 Presence of coronary angioplasty implant and graft: Secondary | ICD-10-CM | POA: Insufficient documentation

## 2017-09-12 DIAGNOSIS — Z85828 Personal history of other malignant neoplasm of skin: Secondary | ICD-10-CM | POA: Insufficient documentation

## 2017-09-12 DIAGNOSIS — Z79899 Other long term (current) drug therapy: Secondary | ICD-10-CM | POA: Insufficient documentation

## 2017-09-12 DIAGNOSIS — Z7951 Long term (current) use of inhaled steroids: Secondary | ICD-10-CM | POA: Insufficient documentation

## 2017-09-12 DIAGNOSIS — I252 Old myocardial infarction: Secondary | ICD-10-CM | POA: Diagnosis not present

## 2017-09-12 DIAGNOSIS — Z7982 Long term (current) use of aspirin: Secondary | ICD-10-CM | POA: Diagnosis not present

## 2017-09-12 DIAGNOSIS — E785 Hyperlipidemia, unspecified: Secondary | ICD-10-CM | POA: Diagnosis not present

## 2017-09-12 DIAGNOSIS — Z7902 Long term (current) use of antithrombotics/antiplatelets: Secondary | ICD-10-CM | POA: Diagnosis not present

## 2017-09-12 DIAGNOSIS — I213 ST elevation (STEMI) myocardial infarction of unspecified site: Secondary | ICD-10-CM

## 2017-09-12 NOTE — Progress Notes (Signed)
Cardiac Individual Treatment Plan  Patient Details  Name: Dale Gonzalez MRN: [id number] Date of Birth: [birth year] Referring Provider:     CARDIAC REHAB PHASE II ORIENTATION from 09/12/2017 in [place]  Referring Provider  Vernell Leep MD      Initial Encounter Date:    CARDIAC REHAB PHASE II ORIENTATION from 09/12/2017 in [place]  Date  09/12/17  Referring Provider  Vernell Leep MD      Visit Diagnosis: Status post coronary artery stent placement  ST elevation myocardial infarction (STEMI), unspecified artery ([place])  Patient's Home Medications on Admission:  Current Outpatient Medications:  .  aspirin EC 81 MG tablet, Take 81 mg by mouth daily., Disp: , Rfl:  .  atorvastatin (LIPITOR) 80 MG tablet, Take 80 mg by mouth daily., Disp: , Rfl:  .  EPINEPHrine (EPIPEN) 0.3 mg/0.3 mL SOAJ injection, Inject 0.3 mLs (0.3 mg total) into the muscle once., Disp: 1 Device, Rfl: 5 .  lisinopril (PRINIVIL,ZESTRIL) 10 MG tablet, Take 1 tablet (10 mg total) by mouth daily., Disp: 30 tablet, Rfl: 3 .  metoprolol tartrate (LOPRESSOR) 25 MG tablet, Take 12.5 mg by mouth 2 (two) times daily., Disp: , Rfl:  .  nitroGLYCERIN (NITROSTAT) 0.4 MG SL tablet, Place 1 tablet (0.4 mg total) under the tongue every 5 (five) minutes as needed for chest pain., Disp: 30 tablet, Rfl: 3 .  ticagrelor (BRILINTA) 90 MG TABS tablet, Take 90 mg by mouth 2 (two) times daily., Disp: , Rfl:  .  albuterol (PROVENTIL HFA;VENTOLIN HFA) 108 (90 BASE) MCG/ACT inhaler, Inhale 2 puffs into the lungs every 4 (four) hours as needed for wheezing or shortness of breath. (Patient not taking: Reported on 09/11/2017), Disp: 1 Inhaler, Rfl: 0 .  beclomethasone (QVAR) 80 MCG/ACT inhaler, Inhale 1 puff into the lungs 2 (two) times daily. (Patient not taking: Reported on 09/11/2017), Disp: 1 Inhaler, Rfl: 12  Past Medical History: Past Medical History:  Diagnosis  Date  . Basal cell cancer    right cheek; ?left arm; Dr Wilhemina Bonito  . Hyperlipidemia    LDL goal = <115  . Myocardial infarction ([place]) 07/27/2017  . RAD (reactive airway disease) 1998   cold induced cough @ [facility]  . Seasonal allergies     Tobacco Use: Social History   Tobacco Use  Smoking Status Never Smoker  Smokeless Tobacco Never Used    Labs: Recent Review Flowsheet Data    Labs for ITP Cardiac and Pulmonary Rehab Latest Ref Rng & Units 12/20/2010 12/19/2011 12/22/2012 12/29/2013 01/24/2015   Cholestrol 100 - 199 mg/dL 194 185 186 212(H) 180   LDLCALC 0 - 99 mg/dL 130(H) 119(H) 125(H) 148(H) 112(H)   HDL >39 mg/dL 31.80(L) 39.60 30.70(L) 34.40(L) 34(L)   Trlycerides 0 - 149 mg/dL 159.0(H) 131.0 152.0(H) 150.0(H) 168(H)      Capillary Blood Glucose: No results found for: GLUCAP   Exercise Target Goals: Date: 09/12/17  Exercise Program Goal: Individual exercise prescription set using results from initial 6 min walk test and THRR while considering  patient's activity barriers and safety.   Exercise Prescription Goal: Initial exercise prescription builds to 30-45 minutes a day of aerobic activity, 2-3 days per week.  Home exercise guidelines will be given to patient during program as part of exercise prescription that the participant will acknowledge.  Activity Barriers & Risk Stratification: Activity Barriers & Cardiac Risk Stratification - 09/12/17 1214      Activity  Barriers & Cardiac Risk Stratification   Activity Barriers  None    Cardiac Risk Stratification  Moderate       6 Minute Walk: 6 Minute Walk    Row Name 09/12/17 1203         6 Minute Walk   Phase  Initial     Distance  1963 feet     Walk Time  6 minutes     # of Rest Breaks  0     MPH  4.17     METS  3.72     RPE  7     VO2 Peak  14.6     Symptoms  No     Resting HR  72 bpm     Resting BP  110/80     Resting Oxygen Saturation   98 %     Exercise Oxygen Saturation  during 6 min walk  99 %      Max Ex. HR  91 bpm     Max Ex. BP  116/78     2 Minute Post BP  116/70        Oxygen Initial Assessment:   Oxygen Re-Evaluation:   Oxygen Discharge (Final Oxygen Re-Evaluation):   Initial Exercise Prescription: Initial Exercise Prescription - 09/12/17 1200      Date of Initial Exercise RX and Referring Provider   Date  09/12/17    Referring Provider  Vernell Leep MD      Treadmill   MPH  3    Grade  1    Minutes  10    METs  3.71      Bike   Level  1    Minutes  10    METs  3.52      Elliptical   Level  1    Minutes  10      Prescription Details   Frequency (times per week)  3    Duration  Progress to 30 minutes of continuous aerobic without signs/symptoms of physical distress      Intensity   THRR 40-80% of Max Heartrate  61-122    Ratings of Perceived Exertion  11-13    Perceived Dyspnea  0-4      Progression   Progression  Continue to progress workloads to maintain intensity without signs/symptoms of physical distress.      Resistance Training   Training Prescription  Yes    Weight  5lbs    Reps  10-15       Perform Capillary Blood Glucose checks as needed.  Exercise Prescription Changes:   Exercise Comments:   Exercise Goals and Review:  Exercise Goals    Row Name 09/12/17 1221             Exercise Goals   Increase Physical Activity  Yes       Intervention  Provide advice, education, support and counseling about physical activity/exercise needs.;Develop an individualized exercise prescription for aerobic and resistive training based on initial evaluation findings, risk stratification, comorbidities and participant's personal goals.       Expected Outcomes  Short Term: Attend rehab on a regular basis to increase amount of physical activity.;Long Term: Add in home exercise to make exercise part of routine and to increase amount of physical activity.;Long Term: Exercising regularly at least 3-5 days a week.       Increase  Strength and Stamina  Yes       Intervention  Provide advice, education, support  and counseling about physical activity/exercise needs.;Develop an individualized exercise prescription for aerobic and resistive training based on initial evaluation findings, risk stratification, comorbidities and participant's personal goals.       Expected Outcomes  Short Term: Increase workloads from initial exercise prescription for resistance, speed, and METs.;Short Term: Perform resistance training exercises routinely during rehab and add in resistance training at home;Long Term: Improve cardiorespiratory fitness, muscular endurance and strength as measured by increased METs and functional capacity (6MWT)       Able to understand and use rate of perceived exertion (RPE) scale  Yes       Intervention  Provide education and explanation on how to use RPE scale       Expected Outcomes  Short Term: Able to use RPE daily in rehab to express subjective intensity level;Long Term:  Able to use RPE to guide intensity level when exercising independently       Knowledge and understanding of Target Heart Rate Range (THRR)  Yes       Intervention  Provide education and explanation of THRR including how the numbers were predicted and where they are located for reference       Expected Outcomes  Short Term: Able to state/look up THRR;Short Term: Able to use daily as guideline for intensity in rehab;Long Term: Able to use THRR to govern intensity when exercising independently       Able to check pulse independently  Yes       Intervention  Provide education and demonstration on how to check pulse in carotid and radial arteries.;Review the importance of being able to check your own pulse for safety during independent exercise       Expected Outcomes  Short Term: Able to explain why pulse checking is important during independent exercise;Long Term: Able to check pulse independently and accurately       Understanding of Exercise  Prescription  Yes       Intervention  Provide education, explanation, and written materials on patient's individual exercise prescription       Expected Outcomes  Short Term: Able to explain program exercise prescription;Long Term: Able to explain home exercise prescription to exercise independently          Exercise Goals Re-Evaluation :    Discharge Exercise Prescription (Final Exercise Prescription Changes):   Nutrition:  Target Goals: Understanding of nutrition guidelines, daily intake of sodium 1500mg , cholesterol 200mg , calories 30% from fat and 7% or less from saturated fats, daily to have 5 or more servings of fruits and vegetables.  Biometrics: Pre Biometrics - 09/12/17 1221      Pre Biometrics   Height  5\' 8"  (1.727 m)    Weight  167 lb 1.7 oz (75.8 kg)    Waist Circumference  34 inches    Hip Circumference  36 inches    Waist to Hip Ratio  0.94 %    BMI (Calculated)  25.41    Triceps Skinfold  13 mm    % Body Fat  23.3 %    Grip Strength  42 kg    Flexibility  15 in    Single Leg Stand  30 seconds        Nutrition Therapy Plan and Nutrition Goals:   Nutrition Assessments:   Nutrition Goals Re-Evaluation:   Nutrition Goals Re-Evaluation:   Nutrition Goals Discharge (Final Nutrition Goals Re-Evaluation):   Psychosocial: Target Goals: Acknowledge presence or absence of significant depression and/or stress, maximize coping skills, provide positive support  system. Participant is able to verbalize types and ability to use techniques and skills needed for reducing stress and depression.  Initial Review & Psychosocial Screening: Initial Psych Review & Screening - 09/12/17 1338      Initial Review   Current issues with  None Identified      Family Dynamics   Good Support System?  Yes      Barriers   Psychosocial barriers to participate in program  The patient should benefit from training in stress management and relaxation.      Screening  Interventions   Interventions  Encouraged to exercise    Expected Outcomes  Short Term goal: Utilizing psychosocial counselor, staff and physician to assist with identification of specific Stressors or current issues interfering with healing process. Setting desired goal for each stressor or current issue identified.;Long Term Goal: Stressors or current issues are controlled or eliminated.;Short Term goal: Identification and review with participant of any Quality of Life or Depression concerns found by scoring the questionnaire.;Long Term goal: The participant improves quality of Life and PHQ9 Scores as seen by post scores and/or verbalization of changes       Quality of Life Scores: Quality of Life - 09/12/17 1226      Quality of Life Scores   Health/Function Pre  29.5 %    Socioeconomic Pre  30 %    Psych/Spiritual Pre  29.14 %    Family Pre  28.8 %    GLOBAL Pre  29.43 %      Scores of 19 and below usually indicate a poorer quality of life in these areas.  A difference of  2-3 points is a clinically meaningful difference.  A difference of 2-3 points in the total score of the Quality of Life Index has been associated with significant improvement in overall quality of life, self-image, physical symptoms, and general health in studies assessing change in quality of life.  PHQ-9: Recent Review Flowsheet Data    There is no flowsheet data to display.     Interpretation of Total Score  Total Score Depression Severity:  1-4 = Minimal depression, 5-9 = Mild depression, 10-14 = Moderate depression, 15-19 = Moderately severe depression, 20-27 = Severe depression   Psychosocial Evaluation and Intervention:   Psychosocial Re-Evaluation:   Psychosocial Discharge (Final Psychosocial Re-Evaluation):   Vocational Rehabilitation: Provide vocational rehab assistance to qualifying candidates.   Vocational Rehab Evaluation & Intervention: Vocational Rehab - 09/12/17 1338      Initial  Vocational Rehab Evaluation & Intervention   Assessment shows need for Vocational Rehabilitation  No       Education: Education Goals: Education classes will be provided on a weekly basis, covering required topics. Participant will state understanding/return demonstration of topics presented.  Learning Barriers/Preferences: Learning Barriers/Preferences - 09/12/17 1203      Learning Barriers/Preferences   Learning Barriers  Sight    Learning Preferences  Skilled Demonstration;Verbal Instruction;Written Material       Education Topics: Count Your Pulse:  -Group instruction provided by verbal instruction, demonstration, patient participation and written materials to support subject.  Instructors address importance of being able to find your pulse and how to count your pulse when at home without a heart monitor.  Patients get hands on experience counting their pulse with staff help and individually.   Heart Attack, Angina, and Risk Factor Modification:  -Group instruction provided by verbal instruction, video, and written materials to support subject.  Instructors address signs and symptoms of angina and  heart attacks.    Also discuss risk factors for heart disease and how to make changes to improve heart health risk factors.   Functional Fitness:  -Group instruction provided by verbal instruction, demonstration, patient participation, and written materials to support subject.  Instructors address safety measures for doing things around the house.  Discuss how to get up and down off the floor, how to pick things up properly, how to safely get out of a chair without assistance, and balance training.   Meditation and Mindfulness:  -Group instruction provided by verbal instruction, patient participation, and written materials to support subject.  Instructor addresses importance of mindfulness and meditation practice to help reduce stress and improve awareness.  Instructor also leads  participants through a meditation exercise.    Stretching for Flexibility and Mobility:  -Group instruction provided by verbal instruction, patient participation, and written materials to support subject.  Instructors lead participants through series of stretches that are designed to increase flexibility thus improving mobility.  These stretches are additional exercise for major muscle groups that are typically performed during regular warm up and cool down.   Hands Only CPR:  -Group verbal, video, and participation provides a basic overview of AHA guidelines for community CPR. Role-play of emergencies allow participants the opportunity to practice calling for help and chest compression technique with discussion of AED use.   Hypertension: -Group verbal and written instruction that provides a basic overview of hypertension including the most recent diagnostic guidelines, risk factor reduction with self-care instructions and medication management.    Nutrition I class: Heart Healthy Eating:  -Group instruction provided by PowerPoint slides, verbal discussion, and written materials to support subject matter. The instructor gives an explanation and review of the Therapeutic Lifestyle Changes diet recommendations, which includes a discussion on lipid goals, dietary fat, sodium, fiber, plant stanol/sterol esters, sugar, and the components of a well-balanced, healthy diet.   Nutrition II class: Lifestyle Skills:  -Group instruction provided by PowerPoint slides, verbal discussion, and written materials to support subject matter. The instructor gives an explanation and review of label reading, grocery shopping for heart health, heart healthy recipe modifications, and ways to make healthier choices when eating out.   Diabetes Question & Answer:  -Group instruction provided by PowerPoint slides, verbal discussion, and written materials to support subject matter. The instructor gives an explanation  and review of diabetes co-morbidities, pre- and post-prandial blood glucose goals, pre-exercise blood glucose goals, signs, symptoms, and treatment of hypoglycemia and hyperglycemia, and foot care basics.   Diabetes Blitz:  -Group instruction provided by PowerPoint slides, verbal discussion, and written materials to support subject matter. The instructor gives an explanation and review of the physiology behind type 1 and type 2 diabetes, diabetes medications and rational behind using different medications, pre- and post-prandial blood glucose recommendations and Hemoglobin A1c goals, diabetes diet, and exercise including blood glucose guidelines for exercising safely.    Portion Distortion:  -Group instruction provided by PowerPoint slides, verbal discussion, written materials, and food models to support subject matter. The instructor gives an explanation of serving size versus portion size, changes in portions sizes over the last 20 years, and what consists of a serving from each food group.   Stress Management:  -Group instruction provided by verbal instruction, video, and written materials to support subject matter.  Instructors review role of stress in heart disease and how to cope with stress positively.     Exercising on Your Own:  -Group instruction provided by verbal instruction,  power point, and written materials to support subject.  Instructors discuss benefits of exercise, components of exercise, frequency and intensity of exercise, and end points for exercise.  Also discuss use of nitroglycerin and activating EMS.  Review options of places to exercise outside of rehab.  Review guidelines for sex with heart disease.   Cardiac Drugs I:  -Group instruction provided by verbal instruction and written materials to support subject.  Instructor reviews cardiac drug classes: antiplatelets, anticoagulants, beta blockers, and statins.  Instructor discusses reasons, side effects, and lifestyle  considerations for each drug class.   Cardiac Drugs II:  -Group instruction provided by verbal instruction and written materials to support subject.  Instructor reviews cardiac drug classes: angiotensin converting enzyme inhibitors (ACE-I), angiotensin II receptor blockers (ARBs), nitrates, and calcium channel blockers.  Instructor discusses reasons, side effects, and lifestyle considerations for each drug class.   Anatomy and Physiology of the Circulatory System:  Group verbal and written instruction and models provide basic cardiac anatomy and physiology, with the coronary electrical and arterial systems. Review of: AMI, Angina, Valve disease, Heart Failure, Peripheral Artery Disease, Cardiac Arrhythmia, Pacemakers, and the ICD.   Other Education:  -Group or individual verbal, written, or video instructions that support the educational goals of the cardiac rehab program.   Knowledge Questionnaire Score: Knowledge Questionnaire Score - 09/12/17 1224      Knowledge Questionnaire Score   Pre Score  22/24       Core Components/Risk Factors/Patient Goals at Admission: Personal Goals and Risk Factors at Admission - 09/12/17 1222      Core Components/Risk Factors/Patient Goals on Admission    Weight Management  Yes;Weight Maintenance    Intervention  Weight Management: Develop a combined nutrition and exercise program designed to reach desired caloric intake, while maintaining appropriate intake of nutrient and fiber, sodium and fats, and appropriate energy expenditure required for the weight goal.;Weight Management: Provide education and appropriate resources to help participant work on and attain dietary goals.;Weight Management/Obesity: Establish reasonable short term and long term weight goals.    Admit Weight  167 lb 1.7 oz (75.8 kg)    Goal Weight: Short Term  167 lb 1.7 oz (75.8 kg)    Goal Weight: Long Term  167 lb (75.8 kg)    Expected Outcomes  Short Term: Continue to assess and  modify interventions until short term weight is achieved;Long Term: Adherence to nutrition and physical activity/exercise program aimed toward attainment of established weight goal;Weight Maintenance: Understanding of the daily nutrition guidelines, which includes 25-35% calories from fat, 7% or less cal from saturated fats, less than 200mg  cholesterol, less than 1.5gm of sodium, & 5 or more servings of fruits and vegetables daily;Understanding of distribution of calorie intake throughout the day with the consumption of 4-5 meals/snacks;Understanding recommendations for meals to include 15-35% energy as protein, 25-35% energy from fat, 35-60% energy from carbohydrates, less than 200mg  of dietary cholesterol, 20-35 gm of total fiber daily    Lipids  Yes    Intervention  Provide education and support for participant on nutrition & aerobic/resistive exercise along with prescribed medications to achieve LDL 70mg , HDL >40mg .    Expected Outcomes  Short Term: Participant states understanding of desired cholesterol values and is compliant with medications prescribed. Participant is following exercise prescription and nutrition guidelines.;Long Term: Cholesterol controlled with medications as prescribed, with individualized exercise RX and with personalized nutrition plan. Value goals: LDL < 70mg , HDL > 40 mg.       Core  Components/Risk Factors/Patient Goals Review:    Core Components/Risk Factors/Patient Goals at Discharge (Final Review):    ITP Comments: ITP Comments    Row Name 09/12/17 1200           ITP Comments  Dr. Fransico Him          Comments:  Patient attended orientation from 0815 to 1000 to review rules and guidelines for program. Completed 6 minute walk test, Intitial ITP, and exercise prescription.  VSS. Telemetry- SR with negative QRS BBB TWI. Pt tolerated well with no complaints.  Brief Psychosocial assessment reveal no immediate barriers to participating in cardiac rehab.  Pt  feels supported by his family in his rehab.Cherre Huger, BSN Cardiac and Training and development officer

## 2017-09-16 ENCOUNTER — Encounter (HOSPITAL_COMMUNITY): Payer: Self-pay

## 2017-09-16 ENCOUNTER — Encounter (HOSPITAL_COMMUNITY)
Admission: RE | Admit: 2017-09-16 | Discharge: 2017-09-16 | Disposition: A | Discharge status: Home / Self Care | Payer: PPO | Source: Ambulatory Visit | Attending: Cardiology | Admitting: Cardiology

## 2017-09-16 DIAGNOSIS — I213 ST elevation (STEMI) myocardial infarction of unspecified site: Secondary | ICD-10-CM

## 2017-09-16 DIAGNOSIS — I252 Old myocardial infarction: Secondary | ICD-10-CM | POA: Diagnosis not present

## 2017-09-16 DIAGNOSIS — Z955 Presence of coronary angioplasty implant and graft: Secondary | ICD-10-CM

## 2017-09-16 NOTE — Progress Notes (Addendum)
Daily Session Note  Patient Details  Name: Dale Gonzalez MRN: [id number] Date of Birth: [birth year] Referring Provider:     CARDIAC REHAB PHASE II ORIENTATION from 09/12/2017 in [facility]  Referring Provider  Vernell Leep MD      Encounter Date: 09/16/2017  Check In: Session Check In - 09/16/17 0645      Check-In   Location  [facility]    Staff Present  Trish Fountain, RN, BSN;Joann Rion, RN, Mosie Epstein, MS,ACSM CEP, Exercise Physiologist    Supervising physician immediately available to respond to emergencies  [facility] immediately available    Physician(s)  Dr. Bonner Puna    Medication changes reported      No    Fall or balance concerns reported     No    Tobacco Cessation  No Change    Warm-up and Cool-down  Performed as group-led instruction    Resistance Training Performed  Yes    VAD Patient?  No      Pain Assessment   Currently in Pain?  No/denies    Multiple Pain Sites  No       Capillary Blood Glucose: No results found for this or any previous visit (from the past 24 hour(s)).    Social History   Tobacco Use  Smoking Status Never Smoker  Smokeless Tobacco Never Used    Goals Met:  Exercise tolerated well  Goals Unmet:  Not Applicable  Comments: Pt started cardiac rehab today.  Pt tolerated light exercise without difficulty. VSS, telemetry-sinus rhythm, asymptomatic.  Medication list reconciled. Pt denies barriers to medicaiton compliance. Pt does c/o dry cough persistent since December.  Cough occurs at night lying down, wakes him up a few times each night.  Pt concerned potential side effect of lisinopril. Dr. Bonney Roussel office  made aware.   PSYCHOSOCIAL ASSESSMENT:  PHQ-0. Pt exhibits positive coping skills, hopeful outlook with supportive family. No psychosocial needs identified at this time, no psychosocial interventions necessary.    Pt enjoys exercising, volunteering and spending time  with family.    Pt oriented to exercise equipment and routine.    Understanding verbalized.   Dr. Fransico Him is Medical Director for Cardiac Rehab at [facility].

## 2017-09-18 ENCOUNTER — Encounter (HOSPITAL_COMMUNITY)
Admission: RE | Admit: 2017-09-18 | Discharge: 2017-09-18 | Disposition: A | Discharge status: Home / Self Care | Payer: PPO | Source: Ambulatory Visit | Attending: Cardiology | Admitting: Cardiology

## 2017-09-18 DIAGNOSIS — I252 Old myocardial infarction: Secondary | ICD-10-CM | POA: Diagnosis not present

## 2017-09-18 DIAGNOSIS — I213 ST elevation (STEMI) myocardial infarction of unspecified site: Secondary | ICD-10-CM

## 2017-09-18 DIAGNOSIS — Z955 Presence of coronary angioplasty implant and graft: Secondary | ICD-10-CM

## 2017-09-19 ENCOUNTER — Ambulatory Visit (HOSPITAL_COMMUNITY): Payer: PPO

## 2017-09-20 ENCOUNTER — Encounter (HOSPITAL_COMMUNITY)
Admission: RE | Admit: 2017-09-20 | Discharge: 2017-09-20 | Disposition: A | Discharge status: Home / Self Care | Payer: PPO | Source: Ambulatory Visit | Attending: Cardiology | Admitting: Cardiology

## 2017-09-20 DIAGNOSIS — Z955 Presence of coronary angioplasty implant and graft: Secondary | ICD-10-CM | POA: Insufficient documentation

## 2017-09-20 DIAGNOSIS — I252 Old myocardial infarction: Secondary | ICD-10-CM | POA: Diagnosis not present

## 2017-09-20 DIAGNOSIS — Z7951 Long term (current) use of inhaled steroids: Secondary | ICD-10-CM | POA: Insufficient documentation

## 2017-09-20 DIAGNOSIS — Z79899 Other long term (current) drug therapy: Secondary | ICD-10-CM | POA: Insufficient documentation

## 2017-09-20 DIAGNOSIS — Z7982 Long term (current) use of aspirin: Secondary | ICD-10-CM | POA: Diagnosis not present

## 2017-09-20 DIAGNOSIS — E785 Hyperlipidemia, unspecified: Secondary | ICD-10-CM | POA: Diagnosis not present

## 2017-09-20 DIAGNOSIS — I213 ST elevation (STEMI) myocardial infarction of unspecified site: Secondary | ICD-10-CM

## 2017-09-20 DIAGNOSIS — Z7902 Long term (current) use of antithrombotics/antiplatelets: Secondary | ICD-10-CM | POA: Insufficient documentation

## 2017-09-20 DIAGNOSIS — Z85828 Personal history of other malignant neoplasm of skin: Secondary | ICD-10-CM | POA: Diagnosis not present

## 2017-09-23 ENCOUNTER — Encounter (HOSPITAL_COMMUNITY): Payer: PPO

## 2017-09-25 ENCOUNTER — Encounter (HOSPITAL_COMMUNITY)
Admission: RE | Admit: 2017-09-25 | Discharge: 2017-09-25 | Disposition: A | Discharge status: Home / Self Care | Payer: PPO | Source: Ambulatory Visit | Attending: Cardiology | Admitting: Cardiology

## 2017-09-25 ENCOUNTER — Encounter (HOSPITAL_COMMUNITY): Payer: PPO

## 2017-09-25 DIAGNOSIS — I252 Old myocardial infarction: Secondary | ICD-10-CM | POA: Diagnosis not present

## 2017-09-25 DIAGNOSIS — Z955 Presence of coronary angioplasty implant and graft: Secondary | ICD-10-CM

## 2017-09-25 DIAGNOSIS — I213 ST elevation (STEMI) myocardial infarction of unspecified site: Secondary | ICD-10-CM

## 2017-09-27 ENCOUNTER — Encounter (HOSPITAL_COMMUNITY): Payer: PPO

## 2017-09-27 ENCOUNTER — Encounter (HOSPITAL_COMMUNITY)
Admission: RE | Admit: 2017-09-27 | Discharge: 2017-09-27 | Disposition: A | Discharge status: Home / Self Care | Payer: PPO | Source: Ambulatory Visit | Attending: Cardiology | Admitting: Cardiology

## 2017-09-27 DIAGNOSIS — Z955 Presence of coronary angioplasty implant and graft: Secondary | ICD-10-CM

## 2017-09-27 DIAGNOSIS — I213 ST elevation (STEMI) myocardial infarction of unspecified site: Secondary | ICD-10-CM

## 2017-09-27 DIAGNOSIS — I252 Old myocardial infarction: Secondary | ICD-10-CM | POA: Diagnosis not present

## 2017-09-30 ENCOUNTER — Encounter (HOSPITAL_COMMUNITY): Payer: PPO

## 2017-10-01 ENCOUNTER — Encounter (HOSPITAL_COMMUNITY): Payer: Self-pay

## 2017-10-02 ENCOUNTER — Encounter (HOSPITAL_COMMUNITY)
Admission: RE | Admit: 2017-10-02 | Discharge: 2017-10-02 | Disposition: A | Discharge status: Home / Self Care | Payer: PPO | Source: Ambulatory Visit | Attending: Cardiology | Admitting: Cardiology

## 2017-10-02 ENCOUNTER — Encounter (HOSPITAL_COMMUNITY): Payer: PPO

## 2017-10-02 DIAGNOSIS — I213 ST elevation (STEMI) myocardial infarction of unspecified site: Secondary | ICD-10-CM

## 2017-10-02 DIAGNOSIS — Z955 Presence of coronary angioplasty implant and graft: Secondary | ICD-10-CM

## 2017-10-04 ENCOUNTER — Encounter (HOSPITAL_COMMUNITY): Payer: PPO

## 2017-10-07 ENCOUNTER — Encounter (HOSPITAL_COMMUNITY): Payer: PPO

## 2017-10-07 ENCOUNTER — Telehealth (HOSPITAL_COMMUNITY): Payer: Self-pay | Admitting: Cardiac Rehabilitation

## 2017-10-07 ENCOUNTER — Encounter (HOSPITAL_COMMUNITY): Payer: Self-pay

## 2017-10-07 DIAGNOSIS — D692 Other nonthrombocytopenic purpura: Secondary | ICD-10-CM | POA: Diagnosis not present

## 2017-10-07 DIAGNOSIS — E7800 Pure hypercholesterolemia, unspecified: Secondary | ICD-10-CM | POA: Diagnosis not present

## 2017-10-07 DIAGNOSIS — J4520 Mild intermittent asthma, uncomplicated: Secondary | ICD-10-CM | POA: Diagnosis not present

## 2017-10-07 DIAGNOSIS — I10 Essential (primary) hypertension: Secondary | ICD-10-CM | POA: Diagnosis not present

## 2017-10-07 DIAGNOSIS — I2510 Atherosclerotic heart disease of native coronary artery without angina pectoris: Secondary | ICD-10-CM | POA: Diagnosis not present

## 2017-10-07 DIAGNOSIS — Z91030 Bee allergy status: Secondary | ICD-10-CM | POA: Diagnosis not present

## 2017-10-07 DIAGNOSIS — J302 Other seasonal allergic rhinitis: Secondary | ICD-10-CM | POA: Diagnosis not present

## 2017-10-07 DIAGNOSIS — Z6825 Body mass index (BMI) 25.0-25.9, adult: Secondary | ICD-10-CM | POA: Diagnosis not present

## 2017-10-07 NOTE — Telephone Encounter (Signed)
pc to pt to discuss recent CR exercise absences. Pt has been exercising on his own. Pt plans to attend education classes only in future. Pt congratulated on his active lifestyle and encouraged to maintain within safe guidelines.  Understanding verbalized.   Andi Hence, RN, BSN Cardiac Pulmonary Rehab

## 2017-10-08 DIAGNOSIS — D3613 Benign neoplasm of peripheral nerves and autonomic nervous system of lower limb, including hip: Secondary | ICD-10-CM | POA: Diagnosis not present

## 2017-10-08 DIAGNOSIS — L738 Other specified follicular disorders: Secondary | ICD-10-CM | POA: Diagnosis not present

## 2017-10-08 DIAGNOSIS — L570 Actinic keratosis: Secondary | ICD-10-CM | POA: Diagnosis not present

## 2017-10-08 DIAGNOSIS — Z85828 Personal history of other malignant neoplasm of skin: Secondary | ICD-10-CM | POA: Diagnosis not present

## 2017-10-08 DIAGNOSIS — L821 Other seborrheic keratosis: Secondary | ICD-10-CM | POA: Diagnosis not present

## 2017-10-09 ENCOUNTER — Encounter (HOSPITAL_COMMUNITY): Payer: PPO

## 2017-10-11 ENCOUNTER — Encounter (HOSPITAL_COMMUNITY): Payer: PPO

## 2017-10-14 ENCOUNTER — Encounter (HOSPITAL_COMMUNITY): Payer: PPO

## 2017-10-16 ENCOUNTER — Encounter (HOSPITAL_COMMUNITY): Payer: PPO

## 2017-10-18 ENCOUNTER — Encounter (HOSPITAL_COMMUNITY): Payer: PPO

## 2017-10-21 ENCOUNTER — Encounter (HOSPITAL_COMMUNITY): Payer: PPO

## 2017-10-23 ENCOUNTER — Encounter (HOSPITAL_COMMUNITY): Payer: PPO

## 2017-10-25 ENCOUNTER — Encounter (HOSPITAL_COMMUNITY): Payer: PPO

## 2017-10-28 ENCOUNTER — Encounter (HOSPITAL_COMMUNITY): Payer: PPO

## 2017-10-30 ENCOUNTER — Encounter (HOSPITAL_COMMUNITY): Payer: PPO

## 2017-11-01 ENCOUNTER — Encounter (HOSPITAL_COMMUNITY): Payer: PPO

## 2017-11-04 ENCOUNTER — Encounter (HOSPITAL_COMMUNITY): Payer: PPO

## 2017-11-06 ENCOUNTER — Encounter (HOSPITAL_COMMUNITY): Payer: PPO

## 2017-11-08 ENCOUNTER — Encounter (HOSPITAL_COMMUNITY): Payer: PPO

## 2017-11-11 ENCOUNTER — Encounter (HOSPITAL_COMMUNITY): Payer: PPO

## 2017-11-13 ENCOUNTER — Encounter (HOSPITAL_COMMUNITY): Payer: PPO

## 2017-11-13 DIAGNOSIS — Z0189 Encounter for other specified special examinations: Secondary | ICD-10-CM | POA: Diagnosis not present

## 2017-11-15 ENCOUNTER — Encounter (HOSPITAL_COMMUNITY): Payer: PPO

## 2017-11-18 ENCOUNTER — Encounter (HOSPITAL_COMMUNITY): Payer: PPO

## 2017-11-20 ENCOUNTER — Encounter (HOSPITAL_COMMUNITY): Payer: PPO

## 2017-11-22 ENCOUNTER — Encounter (HOSPITAL_COMMUNITY): Payer: PPO

## 2017-11-25 ENCOUNTER — Encounter (HOSPITAL_COMMUNITY): Payer: PPO

## 2017-11-27 ENCOUNTER — Encounter (HOSPITAL_COMMUNITY): Payer: PPO

## 2017-11-27 DIAGNOSIS — S1093XA Contusion of unspecified part of neck, initial encounter: Secondary | ICD-10-CM | POA: Diagnosis not present

## 2017-11-27 DIAGNOSIS — L989 Disorder of the skin and subcutaneous tissue, unspecified: Secondary | ICD-10-CM | POA: Diagnosis not present

## 2017-11-27 DIAGNOSIS — Z6824 Body mass index (BMI) 24.0-24.9, adult: Secondary | ICD-10-CM | POA: Diagnosis not present

## 2017-11-27 DIAGNOSIS — Z85828 Personal history of other malignant neoplasm of skin: Secondary | ICD-10-CM | POA: Diagnosis not present

## 2017-11-29 ENCOUNTER — Encounter (HOSPITAL_COMMUNITY): Payer: PPO

## 2017-12-02 ENCOUNTER — Encounter (HOSPITAL_COMMUNITY): Payer: PPO

## 2017-12-04 ENCOUNTER — Encounter (HOSPITAL_COMMUNITY): Payer: PPO

## 2017-12-06 ENCOUNTER — Encounter (HOSPITAL_COMMUNITY): Payer: PPO

## 2017-12-09 ENCOUNTER — Encounter (HOSPITAL_COMMUNITY): Payer: PPO

## 2017-12-11 ENCOUNTER — Encounter (HOSPITAL_COMMUNITY): Payer: PPO

## 2017-12-13 ENCOUNTER — Encounter (HOSPITAL_COMMUNITY): Payer: PPO

## 2017-12-16 ENCOUNTER — Encounter (HOSPITAL_COMMUNITY): Payer: PPO

## 2017-12-18 ENCOUNTER — Encounter (HOSPITAL_COMMUNITY): Payer: Self-pay | Admitting: Emergency Medicine

## 2017-12-18 ENCOUNTER — Emergency Department (HOSPITAL_COMMUNITY)
Admission: EM | Admit: 2017-12-18 | Discharge: 2017-12-18 | Disposition: A | Discharge status: Home / Self Care | Payer: PPO | Attending: Emergency Medicine | Admitting: Emergency Medicine

## 2017-12-18 DIAGNOSIS — Y998 Other external cause status: Secondary | ICD-10-CM | POA: Diagnosis not present

## 2017-12-18 DIAGNOSIS — Y33XXXA Other specified events, undetermined intent, initial encounter: Secondary | ICD-10-CM | POA: Insufficient documentation

## 2017-12-18 DIAGNOSIS — Y92007 Garden or yard of unspecified non-institutional (private) residence as the place of occurrence of the external cause: Secondary | ICD-10-CM | POA: Diagnosis not present

## 2017-12-18 DIAGNOSIS — I252 Old myocardial infarction: Secondary | ICD-10-CM | POA: Insufficient documentation

## 2017-12-18 DIAGNOSIS — T63441A Toxic effect of venom of bees, accidental (unintentional), initial encounter: Secondary | ICD-10-CM | POA: Insufficient documentation

## 2017-12-18 DIAGNOSIS — Z85828 Personal history of other malignant neoplasm of skin: Secondary | ICD-10-CM | POA: Insufficient documentation

## 2017-12-18 DIAGNOSIS — Y93H9 Activity, other involving exterior property and land maintenance, building and construction: Secondary | ICD-10-CM | POA: Insufficient documentation

## 2017-12-18 DIAGNOSIS — Z7982 Long term (current) use of aspirin: Secondary | ICD-10-CM | POA: Diagnosis not present

## 2017-12-18 DIAGNOSIS — Z955 Presence of coronary angioplasty implant and graft: Secondary | ICD-10-CM | POA: Insufficient documentation

## 2017-12-18 DIAGNOSIS — I2510 Atherosclerotic heart disease of native coronary artery without angina pectoris: Secondary | ICD-10-CM | POA: Insufficient documentation

## 2017-12-18 DIAGNOSIS — Z79899 Other long term (current) drug therapy: Secondary | ICD-10-CM | POA: Diagnosis not present

## 2017-12-18 DIAGNOSIS — S00462A Insect bite (nonvenomous) of left ear, initial encounter: Secondary | ICD-10-CM | POA: Diagnosis present

## 2017-12-18 MED ORDER — EPINEPHRINE 0.3 MG/0.3ML IJ SOAJ: 0.3000 mg | Freq: Once | INTRAMUSCULAR | 0 refills | Status: AC

## 2017-12-18 NOTE — ED Triage Notes (Signed)
Pt states hx of yellow jacket nest sting with allergic reaction causing a throat like swelling. Pt states today he thought he got stung by a bumble bee but it could've been a yellow jacket. He used his epi pen X 1. Denies shortness of breath, no hives. Pt states he just feels "hyper"

## 2017-12-18 NOTE — ED Notes (Signed)
The pt was stung by a bumble bee within an hour of his arrival here.he was stung on the ear  He  Gave himself a epi shot  But bnot from any kind of reaction  No itch no rash no sob no symptoms he has the epi pen from 6 years ago when he was stung by 15 yellow jaclets  Resting with no difficulties

## 2017-12-18 NOTE — ED Provider Notes (Signed)
[facility] EMERGENCY DEPARTMENT Provider Note   CSN: [id number] Arrival date & time: 12/18/17  1439     History   Chief Complaint Chief Complaint  Patient presents with  . Insect Bite  . epipen use    HPI Dale Gonzalez is a 67 y.o. male.  The history is provided by the patient and medical records.  Allergic Reaction  Presenting symptoms: no difficulty breathing, no difficulty swallowing, no itching, no rash, no swelling and no wheezing   Severity:  Mild Duration:  1 hour Prior allergic episodes:  Insect allergies Context: insect bite/sting   Relieved by:  Epinephrine Worsened by:  Nothing Ineffective treatments:  None tried   Past Medical History:  Diagnosis Date  . Basal cell cancer    right cheek; ?left arm; Dr Wilhemina Bonito  . Hyperlipidemia    LDL goal = <115  . Myocardial infarction ([place]) 07/27/2017  . RAD (reactive airway disease) 1998   cold induced cough @ [place]  . Seasonal allergies     Patient Active Problem List   Diagnosis Date Noted  . CAD (coronary artery disease) of artery bypass graft 08/08/2017  . Post PTCA 08/08/2017  . Abnormal TSH 01/25/2015  . Elevated blood pressure reading without diagnosis of hypertension 05/18/2014  . Nonspecific abnormal results of thyroid function study 01/12/2014  . Reactive airways dysfunction syndrome ([place]) 07/24/2013  . Skin cancer, basal cell 12/27/2011  . Nonspecific abnormal electrocardiogram (ECG) (EKG) 12/27/2011  . Microscopic hematuria 12/27/2011  . ALLERGIC RHINITIS 12/19/2007  . HYPOGLYCEMIA, REACTIVE 02/17/2007  . HYPERLIPIDEMIA 12/18/2006     Past Surgical History:  Procedure Laterality Date  . COLONOSCOPY  2003 & 2013   negative; Dr Sharlett Iles  . CORONARY ANGIOPLASTY WITH STENT PLACEMENT  07/27/2017; 08/08/2017  . CORONARY STENT INTERVENTION N/A 08/08/2017   Procedure: CORONARY STENT INTERVENTION;  Surgeon: Nigel Mormon, MD;  Location: [facility];  Service:  Cardiovascular;  Laterality: N/A;  . INTRAVASCULAR ULTRASOUND/IVUS N/A 08/08/2017   Procedure: Intravascular Ultrasound/IVUS;  Surgeon: Nigel Mormon, MD;  Location: [facility];  Service: Cardiovascular;  Laterality: N/A;  . LEFT HEART CATH AND CORONARY ANGIOGRAPHY N/A 08/08/2017   Procedure: LEFT HEART CATH AND CORONARY ANGIOGRAPHY;  Surgeon: Nigel Mormon, MD;  Location: [facility];  Service: Cardiovascular;  Laterality: N/A;  . MOHS SURGERY     "right face; ?left arm"  . TONSILLECTOMY    . WRIST GANGLION EXCISION Left    Dr Wonda Olds        Home Medications    Prior to Admission medications   Medication Sig Start Date End Date Taking? Authorizing Provider  albuterol (PROVENTIL HFA;VENTOLIN HFA) 108 (90 BASE) MCG/ACT inhaler Inhale 2 puffs into the lungs every 4 (four) hours as needed for wheezing or shortness of breath. 06/03/15   Janne Napoleon, NP  aspirin EC 81 MG tablet Take 81 mg by mouth daily.    [provider]  atorvastatin (LIPITOR) 80 MG tablet Take 80 mg by mouth daily.    [provider]  beclomethasone (QVAR) 80 MCG/ACT inhaler Inhale 1 puff into the lungs 2 (two) times daily. 06/03/15   Janne Napoleon, NP  EPINEPHrine (EPIPEN) 0.3 mg/0.3 mL SOAJ injection Inject 0.3 mLs (0.3 mg total) into the muscle once. 11/27/13   Hendricks Limes, MD  lisinopril (PRINIVIL,ZESTRIL) 10 MG tablet Take 1 tablet (10 mg total) by mouth daily. 08/09/17   Patwardhan, Reynold Bowen, MD  metoprolol tartrate (LOPRESSOR) 25 MG tablet Take 12.5 mg by mouth 2 (two) times daily.    [provider]  nitroGLYCERIN (NITROSTAT) 0.4 MG SL tablet Place 1 tablet (0.4 mg total) under the tongue every 5 (five) minutes as needed for chest pain. 08/09/17 08/09/18  Patwardhan, Reynold Bowen, MD  ticagrelor (BRILINTA) 90 MG TABS tablet Take 90 mg by mouth 2 (two) times daily.    [provider]    Family History Family History  Problem Relation Age of Onset    . Cancer Mother         cervical lymph nodes  . Hypertension Father   . Heart disease Father        bypass surgery  . Heart attack Father 37       Colostomy for ? diagnosis  . COPD Father   . Colon cancer Neg Hx   . Esophageal cancer Neg Hx   . Stomach cancer Neg Hx   . Rectal cancer Neg Hx   . Stroke Neg Hx   . Diabetes Neg Hx     Social History Social History   Tobacco Use  . Smoking status: Never Smoker  . Smokeless tobacco: Never Used  Substance Use Topics  . Alcohol use: Yes    Alcohol/week: 0.0 oz    Comment: 08/08/2017 "couple drinks/year"  . Drug use: No     Allergies   Bee venom   Review of Systems Review of Systems  Constitutional: Negative for chills, fatigue and fever.  HENT: Negative for congestion and trouble swallowing.   Eyes: Negative for visual disturbance.  Respiratory: Negative for cough, chest tightness, shortness of breath and wheezing.   Cardiovascular: Negative for chest pain, palpitations and leg swelling.  Gastrointestinal: Negative for abdominal pain, constipation, diarrhea, nausea and vomiting.  Genitourinary: Negative for flank pain.  Musculoskeletal: Negative for back pain, neck pain and neck stiffness.  Skin: Negative for itching, rash and wound.  Neurological: Negative for light-headedness and headaches.  Psychiatric/Behavioral: Negative for agitation and confusion.  All other systems reviewed and are negative.    Physical Exam Updated Vital Signs BP 124/65   Pulse 80   Temp 97.9 F (36.6 C)   Resp 18   SpO2 97%   Physical Exam  Constitutional: He appears well-developed and well-nourished. No distress.  HENT:  Head: Normocephalic and atraumatic.  Nose: Nose normal.  Mouth/Throat: Oropharynx is clear and moist. No oropharyngeal exudate.  Eyes: Pupils are equal, round, and reactive to light. Conjunctivae and EOM are normal.  Neck: Normal range of motion. Neck supple.  Cardiovascular: Normal rate and intact distal  pulses.  No murmur heard. Pulmonary/Chest: Effort normal and breath sounds normal. No respiratory distress. He has no wheezes. He exhibits no tenderness.  Abdominal: Soft. Bowel sounds are normal. He exhibits no distension. There is no tenderness.  Musculoskeletal: He exhibits no edema or tenderness.  Lymphadenopathy:    He has no cervical adenopathy.  Neurological: He is alert. No sensory deficit. He exhibits normal muscle tone.  Skin: Skin is warm. He is not diaphoretic. No erythema. No pallor.  Nursing note and vitals reviewed.    ED Treatments / Results  Labs (all labs ordered are listed, but only abnormal results are displayed) Labs Reviewed - No data to display  EKG EKG Interpretation  Date/Time:  Wednesday Dec 18 2017 16:38:45 EDT Ventricular Rate:  72 PR Interval:    QRS Duration: 97 QT Interval:  393 QTC Calculation: 431 R Axis:   -  86 Text Interpretation:  Sinus rhythm Inferior infarct, old Consider anterior infarct When compared to prior, no significant changes seen.  no STEMI Confirmed by Antony Blackbird [phone]) on 12/18/2017 4:50:08 PM   Radiology No results found.  Procedures Procedures (including critical care time)  Medications Ordered in ED Medications - No data to display   Initial Impression / Assessment and Plan / ED Course  I have reviewed the triage vital signs and the nursing notes.  Pertinent labs & imaging results that were available during my care of the patient were reviewed by me and considered in my medical decision making (see chart for details).     Dale Gonzalez is a 67 y.o. male with a past medical history significant for hyperlipidemia, CAD status post PCI several months ago as well as known be allergy who presents after using an EpiPen for bee sting.  Patient reports that he was working outside approximately hour prior to arrival when a bee stung him on his left ear.  He reports that he has had a history of anaphylaxis with throat  swelling so soon as he was stung he used his EpiPen.  He reports that he did not have the throat swelling, diaphoresis, wheezing, stridor, nausea, vomiting, or lightheadedness.  He had no anaphylactic symptoms.  He presented because he was told if uses EpiPen he needs to come to the ED especially with his recent MI and PCI.  Patient says that he has not had palpitations chest pain or shortness of breath.  He did feel jittery after using epinephrine but he is starting to feel better.  He currently denies any symptoms.  He denied any preceding symptoms.  On exam, patient's lungs are clear.  No wheezing.  No stridor.  No evidence of rash or urticaria.  Oropharyngeal exam unremarkable.  Patient appears well and is resting comfortably.  Patient was not tachycardic on initial evaluation.  Patient will have EKG and be monitored on telemetry for.  Time given the epinephrine use and recent MI.  Doubt patient was truly having an anaphylactic reaction prior to EpiPen use so do not feel strongly that he needs antihistamines or steroids at this time.    Anticipate patient will be stable for discharge after a period of telemetry monitoring.          4:59 PM Patient was reassessed after a period of observation.  Patient's telemetry was reviewed showing no evidence of arrhythmias or tachycardia.  Patient's EKG appeared similar to prior with no STEMI.    Patient was felt to be stable for discharge home.  Do not feel patient had a full anaphylactic reaction however patient had his EpiPen refilled.  Patient will follow up with his PCP in several days and understood return precautions.  Patient had no depressions or concerns and was discharged in good condition.   Final Clinical Impressions(s) / ED Diagnoses   Final diagnoses:  Bee sting, accidental or unintentional, initial encounter    ED Discharge Orders        Ordered    EPINEPHrine 0.3 mg/0.3 mL IJ SOAJ injection   Once     12/18/17 1656      Clinical  Impression: 1. Bee sting, accidental or unintentional, initial encounter     Disposition: Discharge  Condition: Good  I have discussed the results, Dx and Tx plan with the pt(& family if present). He/she/they expressed understanding and agree(s) with the plan. Discharge instructions discussed at great length. Strict return precautions discussed  and pt &/or family have verbalized understanding of the instructions. No further questions at time of discharge.    New Prescriptions   EPINEPHRINE 0.3 MG/0.3 ML IJ SOAJ INJECTION    Inject 0.3 mLs (0.3 mg total) into the muscle once for 1 dose.    Follow Up: Haywood Pao, MD [place] [id number] [facility] EMERGENCY DEPARTMENT [address] [id number] mc [place]       Tegeler, Gwenyth Allegra, MD 12/18/17 1700

## 2017-12-18 NOTE — Discharge Instructions (Signed)
Your work-up today was overall reassuring.  I do not feel you had a full anaphylactic reaction.  We do not think you need to take the antihistamines or steroids at this time.  Please fill the prescription for your EpiPen.  Please follow-up with your primary doctor.  If any symptoms change or worsen, please return to the nearest emergency department.

## 2018-02-06 DIAGNOSIS — L570 Actinic keratosis: Secondary | ICD-10-CM | POA: Diagnosis not present

## 2018-02-06 DIAGNOSIS — Z85828 Personal history of other malignant neoplasm of skin: Secondary | ICD-10-CM | POA: Diagnosis not present

## 2018-02-13 DIAGNOSIS — I252 Old myocardial infarction: Secondary | ICD-10-CM | POA: Diagnosis not present

## 2018-02-13 DIAGNOSIS — I2510 Atherosclerotic heart disease of native coronary artery without angina pectoris: Secondary | ICD-10-CM | POA: Diagnosis not present

## 2018-02-13 DIAGNOSIS — Z0189 Encounter for other specified special examinations: Secondary | ICD-10-CM | POA: Diagnosis not present

## 2018-03-05 DIAGNOSIS — D0462 Carcinoma in situ of skin of left upper limb, including shoulder: Secondary | ICD-10-CM | POA: Diagnosis not present

## 2018-03-05 DIAGNOSIS — D485 Neoplasm of uncertain behavior of skin: Secondary | ICD-10-CM | POA: Diagnosis not present

## 2018-03-05 DIAGNOSIS — Z85828 Personal history of other malignant neoplasm of skin: Secondary | ICD-10-CM | POA: Diagnosis not present

## 2018-03-28 DIAGNOSIS — I10 Essential (primary) hypertension: Secondary | ICD-10-CM | POA: Diagnosis not present

## 2018-03-28 DIAGNOSIS — Z125 Encounter for screening for malignant neoplasm of prostate: Secondary | ICD-10-CM | POA: Diagnosis not present

## 2018-03-28 DIAGNOSIS — R82998 Other abnormal findings in urine: Secondary | ICD-10-CM | POA: Diagnosis not present

## 2018-03-28 DIAGNOSIS — E7800 Pure hypercholesterolemia, unspecified: Secondary | ICD-10-CM | POA: Diagnosis not present

## 2018-04-04 DIAGNOSIS — E7800 Pure hypercholesterolemia, unspecified: Secondary | ICD-10-CM | POA: Diagnosis not present

## 2018-04-04 DIAGNOSIS — D692 Other nonthrombocytopenic purpura: Secondary | ICD-10-CM | POA: Diagnosis not present

## 2018-04-04 DIAGNOSIS — Z0000 Encounter for general adult medical examination without abnormal findings: Secondary | ICD-10-CM | POA: Diagnosis not present

## 2018-04-04 DIAGNOSIS — I10 Essential (primary) hypertension: Secondary | ICD-10-CM | POA: Diagnosis not present

## 2018-04-04 DIAGNOSIS — J302 Other seasonal allergic rhinitis: Secondary | ICD-10-CM | POA: Diagnosis not present

## 2018-04-04 DIAGNOSIS — I2510 Atherosclerotic heart disease of native coronary artery without angina pectoris: Secondary | ICD-10-CM | POA: Diagnosis not present

## 2018-04-04 DIAGNOSIS — J4520 Mild intermittent asthma, uncomplicated: Secondary | ICD-10-CM | POA: Diagnosis not present

## 2018-04-04 DIAGNOSIS — Z85828 Personal history of other malignant neoplasm of skin: Secondary | ICD-10-CM | POA: Diagnosis not present

## 2018-04-04 DIAGNOSIS — Z6824 Body mass index (BMI) 24.0-24.9, adult: Secondary | ICD-10-CM | POA: Diagnosis not present

## 2018-04-04 DIAGNOSIS — K219 Gastro-esophageal reflux disease without esophagitis: Secondary | ICD-10-CM | POA: Diagnosis not present

## 2018-04-04 DIAGNOSIS — Z1389 Encounter for screening for other disorder: Secondary | ICD-10-CM | POA: Diagnosis not present

## 2018-04-04 DIAGNOSIS — Z91030 Bee allergy status: Secondary | ICD-10-CM | POA: Diagnosis not present

## 2018-04-04 DIAGNOSIS — Z1212 Encounter for screening for malignant neoplasm of rectum: Secondary | ICD-10-CM | POA: Diagnosis not present

## 2018-05-03 DIAGNOSIS — Z23 Encounter for immunization: Secondary | ICD-10-CM | POA: Diagnosis not present

## 2018-08-06 DIAGNOSIS — I2510 Atherosclerotic heart disease of native coronary artery without angina pectoris: Secondary | ICD-10-CM | POA: Diagnosis not present

## 2018-08-06 DIAGNOSIS — Z0189 Encounter for other specified special examinations: Secondary | ICD-10-CM | POA: Diagnosis not present

## 2018-08-06 DIAGNOSIS — I252 Old myocardial infarction: Secondary | ICD-10-CM | POA: Diagnosis not present

## 2018-09-29 ENCOUNTER — Telehealth: Payer: Self-pay

## 2018-09-29 ENCOUNTER — Ambulatory Visit (INDEPENDENT_AMBULATORY_CARE_PROVIDER_SITE_OTHER): Payer: PPO | Admitting: Cardiology

## 2018-09-29 ENCOUNTER — Encounter: Payer: Self-pay | Admitting: Cardiology

## 2018-09-29 VITALS — BP 113/67 | HR 58 | Ht 65.0 in | Wt 163.0 lb

## 2018-09-29 DIAGNOSIS — R079 Chest pain, unspecified: Secondary | ICD-10-CM | POA: Diagnosis not present

## 2018-09-29 DIAGNOSIS — I25118 Atherosclerotic heart disease of native coronary artery with other forms of angina pectoris: Secondary | ICD-10-CM | POA: Diagnosis not present

## 2018-09-29 DIAGNOSIS — I208 Other forms of angina pectoris: Secondary | ICD-10-CM

## 2018-09-29 NOTE — Telephone Encounter (Signed)
Pt called c/o a sharp stab in chest and he took 3 nitro at 5 mins apart and it did help; this morning he had the same thing this happened after working out but he did not take a nitro; Please advise

## 2018-09-29 NOTE — Telephone Encounter (Signed)
Spoke with pt he is coming today to see MP

## 2018-09-29 NOTE — Telephone Encounter (Signed)
I would like to see him for a stat visit today, if chest pain has resolved. If ongoing chest pain, should call 911 and go to the ED.

## 2018-09-29 NOTE — Progress Notes (Signed)
Patient is here for follow up visit.  Subjective:   @Patient  ID: Dale Gonzalez, male    DOB: Dec 20, 1950, 68 y.o.   MRN: [id number]  Chief Complaint  Patient presents with  . Chest Pain    HPI  68 year old Caucasian with coronary artery disease status post primary PCI to OM for STEMI in [place] on 07/27/2017, elective PCI to non-culprit severe LAD diagonal stenosis with Bifurcation stenting on 08/08/2017.  Patient was last seen by me in 07/2018.  At that time, he was doing well without any angina symptoms.  No changes were made in his medical therapy.  Patient called this morning complaining of 2 episodes of nitrate responsive chest pain.  I suggested he be visit if he has ongoing chest pain.  Fortunately, pain is currently resolved.  I recommended stat visit for the same reason.  Patient has had 2-3 episodes of left sided, focal, sharp pain, 2/10, lasting for up to an hour. All episodes occur at rest and resolved with an old bottle of SL NTG. Patient did workout next day with body pump and spinning for an hour without any chest pain. Next day, he worked out again without any chest pain during the workout, but did have recurrence after he finished his workout. Pain was similar to his prior MI chest pain, in quality, but much lower in intensity.     Past Medical History:  Diagnosis Date  . Basal cell cancer    right cheek; ?left arm; Dr Wilhemina Bonito  . Hyperlipidemia    LDL goal = <115  . Myocardial infarction ([place]) 07/27/2017  . RAD (reactive airway disease) 1998   cold induced cough @ [place]  . Seasonal allergies     Past Surgical History:  Procedure Laterality Date  . COLONOSCOPY  2003 & 2013   negative; Dr Sharlett Iles  . CORONARY ANGIOPLASTY WITH STENT PLACEMENT  07/27/2017; 08/08/2017  . CORONARY STENT INTERVENTION N/A 08/08/2017   Procedure: CORONARY STENT INTERVENTION;  Surgeon: Nigel Mormon, MD;  Location: [facility];  Service: Cardiovascular;   Laterality: N/A;  . INTRAVASCULAR ULTRASOUND/IVUS N/A 08/08/2017   Procedure: Intravascular Ultrasound/IVUS;  Surgeon: Nigel Mormon, MD;  Location: [facility];  Service: Cardiovascular;  Laterality: N/A;  . LEFT HEART CATH AND CORONARY ANGIOGRAPHY N/A 08/08/2017   Procedure: LEFT HEART CATH AND CORONARY ANGIOGRAPHY;  Surgeon: Nigel Mormon, MD;  Location: [facility];  Service: Cardiovascular;  Laterality: N/A;  . MOHS SURGERY     "right face; ?left arm"  . TONSILLECTOMY    . WRIST GANGLION EXCISION Left    Dr Wonda Olds    Social History   Socioeconomic History  . Marital status: Married    Spouse name: Not on file  . Number of children: 2  . Years of education: Not on file  . Highest education level: Not on file  Occupational History  . Not on file  Social Needs  . Financial resource strain: Not on file  . Food insecurity:    Worry: Not on file    Inability: Not on file  . Transportation needs:    Medical: Not on file    Non-medical: Not on file  Tobacco Use  . Smoking status: Never Smoker  . Smokeless tobacco: Never Used  Substance and Sexual Activity  . Alcohol use: Yes    Comment: 08/08/2017 "couple drinks/year"  . Drug use: No  . Sexual activity: Not Currently  Lifestyle  .  Physical activity:    Days per week: Not on file    Minutes per session: Not on file  . Stress: Not on file  Relationships  . Social connections:    Talks on phone: Not on file    Gets together: Not on file    Attends religious service: Not on file    Active member of club or organization: Not on file    Attends meetings of clubs or organizations: Not on file    Relationship status: Not on file  . Intimate partner violence:    Fear of current or ex partner: Not on file    Emotionally abused: Not on file    Physically abused: Not on file    Forced sexual activity: Not on file  Other Topics Concern  . Not on file  Social History Narrative  . Not on file     Current Outpatient Medications on File Prior to Visit  Medication Sig Dispense Refill  . aspirin EC 81 MG tablet Take 81 mg by mouth daily.    Marland Kitchen atorvastatin (LIPITOR) 80 MG tablet Take 80 mg by mouth at bedtime.     Marland Kitchen BRILINTA 60 MG TABS tablet     . EPINEPHrine (EPIPEN) 0.3 mg/0.3 mL SOAJ injection Inject 0.3 mLs (0.3 mg total) into the muscle once. (Patient taking differently: Inject 0.3 mg into the muscle once as needed (for anaphylactic reaction). ) 1 Device 5  . losartan (COZAAR) 25 MG tablet Take 25 mg by mouth daily.    . metoprolol tartrate (LOPRESSOR) 25 MG tablet Take 12.5 mg by mouth daily.     Marland Kitchen albuterol (PROVENTIL HFA;VENTOLIN HFA) 108 (90 BASE) MCG/ACT inhaler Inhale 2 puffs into the lungs every 4 (four) hours as needed for wheezing or shortness of breath. (Patient not taking: Reported on 09/29/2018) 1 Inhaler 0  . beclomethasone (QVAR) 80 MCG/ACT inhaler Inhale 1 puff into the lungs 2 (two) times daily. (Patient not taking: Reported on 09/29/2018) 1 Inhaler 12  . metoprolol succinate (TOPROL-XL) 25 MG 24 hr tablet     . nitroGLYCERIN (NITROSTAT) 0.4 MG SL tablet Place 1 tablet (0.4 mg total) under the tongue every 5 (five) minutes as needed for chest pain. 30 tablet 3   No current facility-administered medications on file prior to visit.     Cardiovascular studies:  Cath 08/08/2017:   Complex LAD/DIag bifurcation PCI Diag 2.5 X 16 mm Synergy DES LAD 2.75 X 38 mm & 3.5 X 20 mm Synergy [facility] echocardiogram 08/09/2017:  - Left ventricle: The cavity size was normal. The estimated   ejection fraction was in the range of 45% to 50%. Mild   inferolateral hypokinesis. - Mitral valve: There was mild regurgitation. - Normal right atrial pressure.  EKG 09/29/2018: SInus rhythm 69 bpm. Left anterior fascicular block Old inferior infarct. Poor R wave progression. No significant changes compared to previus EKG 08/06/2018.   Review of Systems  Constitution:  Negative for decreased appetite, malaise/fatigue, weight gain and weight loss.  HENT: Negative for congestion.   Eyes: Negative for visual disturbance.  Cardiovascular: Positive for chest pain (Intermittent episodes). Negative for dyspnea on exertion, leg swelling, palpitations and syncope.  Respiratory: Negative for shortness of breath.   Endocrine: Negative for cold intolerance.  Hematologic/Lymphatic: Does not bruise/bleed easily.  Skin: Negative for itching and rash.  Musculoskeletal: Negative for myalgias.  Gastrointestinal: Negative for abdominal pain, nausea and vomiting.  Genitourinary: Negative for dysuria.  Neurological: Negative for dizziness and  weakness.  Psychiatric/Behavioral: The patient is not nervous/anxious.   All other systems reviewed and are negative.      Objective:   Vitals:   09/29/18 1326  BP: 113/67  Pulse: (!) 58  SpO2: 96%     Physical Exam  Constitutional: He is oriented to person, place, and time. He appears well-developed and well-nourished. No distress.  HENT:  Head: Normocephalic and atraumatic.  Eyes: Pupils are equal, round, and reactive to light. Conjunctivae are normal.  Neck: No JVD present.  Cardiovascular: Normal rate, regular rhythm and intact distal pulses.  No murmur heard. Pulmonary/Chest: Effort normal and breath sounds normal. He has no wheezes. He has no rales.  Abdominal: Soft. Bowel sounds are normal. There is no rebound.  Musculoskeletal:        General: No edema.  Lymphadenopathy:    He has no cervical adenopathy.  Neurological: He is alert and oriented to person, place, and time. No cranial nerve deficit.  Skin: Skin is warm and dry.  Psychiatric: He has a normal mood and affect.  Nursing note and vitals reviewed.       Assessment & Recommendations:   68 year old Caucasian with coronary artery disease status post primary PCI to OM for STEMI in [place] on 07/27/2017, elective PCI to non-culprit severe LAD diagonal  stenosis with Bifurcation stenting on 08/08/2017.  Chest pain: Atypical angina. It is curious that he did not have pain during exercise, but did resolve with an old bottle of SL NTG. EKG today shows no ischemia. I will check troponin. If positive, will need admission and cath tomorrow. If negative, recommend exercise nuclear stress test. I have asked the patient to reduce his physical activity until the stress test. If significant abnormalities found, next step would be to proceed with coronary angiography.   CAD: Continue aspirin 81 mg daily, Brilinta 60 mg bid (reduced dose 1 year post PCI). Continue metoprolol succinate 25 mg daily, losartan 25 mg daily.   I will see him after the stress test.    Nigel Mormon, MD [facility] Pager: [phone] Office: [phone] If no answer Cell [phone]

## 2018-09-30 LAB — TROPONIN I: Troponin I: <0.01 ng/mL (ref 0.00–0.04)

## 2018-10-01 ENCOUNTER — Telehealth: Payer: Self-pay

## 2018-10-01 NOTE — Telephone Encounter (Signed)
Spoke to pt, advised him normal Troponin and will proceed with stress test

## 2018-10-01 NOTE — Telephone Encounter (Signed)
-----   Message from [facility], MD sent at 10/01/2018  8:33 AM EST ----- Troponin was normal. We will proceed with the stress test, as scheduled.  Thanks MJP

## 2018-10-03 ENCOUNTER — Ambulatory Visit: Payer: PPO

## 2018-10-03 DIAGNOSIS — R0789 Other chest pain: Secondary | ICD-10-CM | POA: Diagnosis not present

## 2018-10-03 DIAGNOSIS — I208 Other forms of angina pectoris: Secondary | ICD-10-CM

## 2018-10-10 ENCOUNTER — Ambulatory Visit: Payer: PPO | Admitting: Cardiology

## 2018-10-10 ENCOUNTER — Ambulatory Visit (INDEPENDENT_AMBULATORY_CARE_PROVIDER_SITE_OTHER): Payer: PPO | Admitting: Cardiology

## 2018-10-10 ENCOUNTER — Encounter: Payer: Self-pay | Admitting: Cardiology

## 2018-10-10 VITALS — BP 118/68 | HR 60 | Ht 67.0 in | Wt 163.0 lb

## 2018-10-10 DIAGNOSIS — I2510 Atherosclerotic heart disease of native coronary artery without angina pectoris: Secondary | ICD-10-CM | POA: Diagnosis not present

## 2018-10-10 NOTE — Progress Notes (Signed)
Patient is here for follow up visit.  Subjective:   @Patient  ID: Dale Gonzalez, male    DOB: [birth year], 68 y.o.   MRN: [id number]  Chief Complaint  Patient presents with  . Chest Pain  . Follow-up    Test results    HPI  68 year old Caucasian with coronary artery disease status post primary PCI to OM for STEMI in [place] on 07/27/2017, elective PCI to non-culprit severe LAD diagonal stenosis with Bifurcation stenting on 08/08/2017, last seen on 09/29/2018 for occasional episodes of chest pain.  Patient since underwent exercise nuclear stress test that showed excellent exercise capacity, evidence of old LCx territory infarct, but no ischemia. Patient is here for follow up.  He has not had any recurrence of chest pain.     Past Medical History:  Diagnosis Date  . Basal cell cancer    right cheek; ?left arm; Dr Wilhemina Bonito  . Coronary artery disease   . Hyperlipidemia    LDL goal = <115  . Hypertension   . Myocardial infarction ([place]) 07/27/2017  . RAD (reactive airway disease) 1998   cold induced cough @ [facility]  . Seasonal allergies     Past Surgical History:  Procedure Laterality Date  . COLONOSCOPY  2003 & 2013   negative; Dr Sharlett Iles  . CORONARY ANGIOPLASTY WITH STENT PLACEMENT  07/27/2017; 08/08/2017  . CORONARY STENT INTERVENTION N/A 08/08/2017   Procedure: CORONARY STENT INTERVENTION;  Surgeon: Nigel Mormon, MD;  Location: [facility];  Service: Cardiovascular;  Laterality: N/A;  . INTRAVASCULAR ULTRASOUND/IVUS N/A 08/08/2017   Procedure: Intravascular Ultrasound/IVUS;  Surgeon: Nigel Mormon, MD;  Location: [facility];  Service: Cardiovascular;  Laterality: N/A;  . LEFT HEART CATH AND CORONARY ANGIOGRAPHY N/A 08/08/2017   Procedure: LEFT HEART CATH AND CORONARY ANGIOGRAPHY;  Surgeon: Nigel Mormon, MD;  Location: [facility];  Service: Cardiovascular;  Laterality: N/A;  . MOHS SURGERY     "right face; ?left arm"    . TONSILLECTOMY    . WRIST GANGLION EXCISION Left    Dr Wonda Olds    Social History   Socioeconomic History  . Marital status: Married    Spouse name: Not on file  . Number of children: 2  . Years of education: Not on file  . Highest education level: Not on file  Occupational History  . Not on file  Social Needs  . Financial resource strain: Not on file  . Food insecurity:    Worry: Not on file    Inability: Not on file  . Transportation needs:    Medical: Not on file    Non-medical: Not on file  Tobacco Use  . Smoking status: Never Smoker  . Smokeless tobacco: Never Used  Substance and Sexual Activity  . Alcohol use: Yes    Comment: Couple drinks per year  . Drug use: No  . Sexual activity: Not Currently  Lifestyle  . Physical activity:    Days per week: Not on file    Minutes per session: Not on file  . Stress: Not on file  Relationships  . Social connections:    Talks on phone: Not on file    Gets together: Not on file    Attends religious service: Not on file    Active member of club or organization: Not on file    Attends meetings of clubs or organizations: Not on file    Relationship status: Not on file  .  Intimate partner violence:    Fear of current or ex partner: Not on file    Emotionally abused: Not on file    Physically abused: Not on file    Forced sexual activity: Not on file  Other Topics Concern  . Not on file  Social History Narrative  . Not on file    Current Outpatient Medications on File Prior to Visit  Medication Sig Dispense Refill  . aspirin EC 81 MG tablet Take 81 mg by mouth daily.    Marland Kitchen atorvastatin (LIPITOR) 80 MG tablet Take 80 mg by mouth at bedtime.     Marland Kitchen BRILINTA 60 MG TABS tablet     . EPINEPHrine (EPIPEN) 0.3 mg/0.3 mL SOAJ injection Inject 0.3 mLs (0.3 mg total) into the muscle once. (Patient taking differently: Inject 0.3 mg into the muscle once as needed (for anaphylactic reaction). ) 1 Device 5  . losartan (COZAAR) 25 MG  tablet Take 25 mg by mouth daily.    . metoprolol succinate (TOPROL-XL) 25 MG 24 hr tablet     . nitroGLYCERIN (NITROSTAT) 0.4 MG SL tablet Place 0.4 mg under the tongue every 5 (five) minutes as needed for chest pain.    Marland Kitchen albuterol (PROVENTIL HFA;VENTOLIN HFA) 108 (90 BASE) MCG/ACT inhaler Inhale 2 puffs into the lungs every 4 (four) hours as needed for wheezing or shortness of breath. (Patient not taking: Reported on 09/29/2018) 1 Inhaler 0  . beclomethasone (QVAR) 80 MCG/ACT inhaler Inhale 1 puff into the lungs 2 (two) times daily. (Patient not taking: Reported on 09/29/2018) 1 Inhaler 12   No current facility-administered medications on file prior to visit.     Cardiovascular studies:  Exercise sestamibi stress test 10/03/2018:  1. The patient performed treadmill exercise using Bruce protocol, completing 10:19 minutes. The patient completed an estimated workload of 12.3 METS, reaching 87% of the maximum predicted heart rate. Exercise capacity was excellent. Hemodynamic response was normal. Stress symptoms included fatigue. Stress electrocardiogram demonstrated normal sinus rhythm, normal resting conduction, old inferior infarct, no resting arrhythmias, and normal rest repolarization. No ischemic changes seen on stress electrocardiogram.  2. The overall quality of the study is good. Left ventricular cavity is noted to be normal on the rest and stress studies. Gated SPECT imaging demonstrates hypokinesis of the basal inferolateral and mid inferolateral myocardial wall(s). The left ventricular ejection fraction was calculated or visually estimated to be 47%. SPECT images show medium sized, mild intensity perfusion defect with minimal reversibility. Findings suggest old infarct in LCx territory with no significant ischemia.  3. Intermediate risk study.  Cath 08/08/2017:   Complex LAD/DIag bifurcation PCI Diag 2.5 X 16 mm Synergy DES LAD 2.75 X 38 mm & 3.5 X 20 mm Synergy [facility]  echocardiogram 08/09/2017:  - Left ventricle: The cavity size was normal. The estimated   ejection fraction was in the range of 45% to 50%. Mild   inferolateral hypokinesis. - Mitral valve: There was mild regurgitation. - Normal right atrial pressure.  EKG 09/29/2018: SInus rhythm 69 bpm. Left anterior fascicular block Old inferior infarct. Poor R wave progression. No significant changes compared to previus EKG 08/06/2018.   Review of Systems  Constitution: Negative for decreased appetite, malaise/fatigue, weight gain and weight loss.  HENT: Negative for congestion.   Eyes: Negative for visual disturbance.  Cardiovascular: Negative for chest pain  Negative for dyspnea on exertion, leg swelling, palpitations and syncope.  Respiratory: Negative for shortness of breath.   Endocrine: Negative for cold  intolerance.  Hematologic/Lymphatic: Does not bruise/bleed easily.  Skin: Negative for itching and rash.  Musculoskeletal: Negative for myalgias.  Gastrointestinal: Negative for abdominal pain, nausea and vomiting.  Genitourinary: Negative for dysuria.  Neurological: Negative for dizziness and weakness.  Psychiatric/Behavioral: The patient is not nervous/anxious.   All other systems reviewed and are negative.      Objective:   Vitals:   10/10/18 1340  BP: 118/68  Pulse: 60  SpO2: 97%     Physical Exam  Constitutional: He is oriented to person, place, and time. He appears well-developed and well-nourished. No distress.  HENT:  Head: Normocephalic and atraumatic.  Eyes: Pupils are equal, round, and reactive to light. Conjunctivae are normal.  Neck: No JVD present.  Cardiovascular: Normal rate, regular rhythm and intact distal pulses.  No murmur heard. Pulmonary/Chest: Effort normal and breath sounds normal. He has no wheezes. He has no rales.  Abdominal: Soft. Bowel sounds are normal. There is no rebound.  Musculoskeletal:        General: No edema.  Lymphadenopathy:     He has no cervical adenopathy.  Neurological: He is alert and oriented to person, place, and time. No cranial nerve deficit.  Skin: Skin is warm and dry.  Psychiatric: He has a normal mood and affect.  Nursing note and vitals reviewed.       Assessment & Recommendations:   68 year old Caucasian with coronary artery disease status post primary PCI to OM for STEMI in [place] on 07/27/2017, elective PCI to non-culprit severe LAD diagonal stenosis with Bifurcation stenting on 08/08/2017.  Chest pain: Resolved. Reassuring exercise nuclear stress test.  Continue aspirin 81 mg daily, Brilinta 60 mg bid (reduced dose 1 year post PCI). Continue metoprolol succinate 25 mg daily, losartan 25 mg daily.   Pre-op risk stratification: He has upcoming tooth cleaning/surgery. Recommend stopping Brilinta 5 days before the procedure and resume when deemed safe by his dentist.  I will see him back in 1 year.   I will see him after the stress test.    Nigel Mormon, MD [facility] [place] Pager: [phone] Office: [phone] If no answer Cell [phone]

## 2018-10-10 NOTE — Patient Instructions (Signed)
Stress test is reassuring. It only shows evidence of old heart attack. It does not suggest any new problems.  Go ahead with the dental procedure.  Recommend holding Brilinta 5 days before the procedure and resume when deemed safe by your dentist.  I will see you back in 1 year unless you need me sooner.

## 2018-10-14 DIAGNOSIS — L570 Actinic keratosis: Secondary | ICD-10-CM | POA: Diagnosis not present

## 2018-10-14 DIAGNOSIS — Z85828 Personal history of other malignant neoplasm of skin: Secondary | ICD-10-CM | POA: Diagnosis not present

## 2018-10-14 DIAGNOSIS — L821 Other seborrheic keratosis: Secondary | ICD-10-CM | POA: Diagnosis not present

## 2018-10-30 ENCOUNTER — Other Ambulatory Visit: Payer: Self-pay | Admitting: Cardiology

## 2018-11-26 ENCOUNTER — Other Ambulatory Visit: Payer: Self-pay

## 2018-11-26 ENCOUNTER — Other Ambulatory Visit: Payer: Self-pay | Admitting: Podiatry

## 2018-11-26 ENCOUNTER — Ambulatory Visit (INDEPENDENT_AMBULATORY_CARE_PROVIDER_SITE_OTHER): Payer: PPO | Admitting: Podiatry

## 2018-11-26 ENCOUNTER — Encounter: Payer: Self-pay | Admitting: Podiatry

## 2018-11-26 ENCOUNTER — Ambulatory Visit (INDEPENDENT_AMBULATORY_CARE_PROVIDER_SITE_OTHER): Payer: PPO

## 2018-11-26 VITALS — BP 120/66 | HR 58 | Temp 97.3°F | Resp 16

## 2018-11-26 DIAGNOSIS — M779 Enthesopathy, unspecified: Secondary | ICD-10-CM

## 2018-11-26 DIAGNOSIS — M7751 Other enthesopathy of right foot: Secondary | ICD-10-CM

## 2018-11-26 DIAGNOSIS — S134XXD Sprain of ligaments of cervical spine, subsequent encounter: Secondary | ICD-10-CM | POA: Diagnosis not present

## 2018-11-26 DIAGNOSIS — M79671 Pain in right foot: Secondary | ICD-10-CM

## 2018-11-26 DIAGNOSIS — M5412 Radiculopathy, cervical region: Secondary | ICD-10-CM | POA: Diagnosis not present

## 2018-11-26 DIAGNOSIS — M84374A Stress fracture, right foot, initial encounter for fracture: Secondary | ICD-10-CM | POA: Diagnosis not present

## 2018-11-26 MED ORDER — TRIAMCINOLONE ACETONIDE 10 MG/ML IJ SUSP
10.0000 mg | Freq: Once | INTRAMUSCULAR | Status: AC
  Administered 2018-11-26: 10 mg

## 2018-11-26 NOTE — Progress Notes (Signed)
   Subjective:    Patient ID: Dale Gonzalez, male    DOB: 1950/09/18, 68 y.o.   MRN: [id number]  HPI    Review of Systems  All other systems reviewed and are negative.      Objective:   Physical Exam        Assessment & Plan:

## 2018-11-27 NOTE — Progress Notes (Signed)
Subjective:   Patient ID: Dale Gonzalez, male   DOB: 68 y.o.   MRN: [id number]   HPI Patient presents stating he has a lot of pain in the forefoot right and it is been going on now for about 5 days.  States he is increased his activity levels and patient states that it is been hard for him to walk or be active.  Patient does not smoke and does like to be active   Review of Systems  All other systems reviewed and are negative.       Objective:  Physical Exam Vitals signs and nursing note reviewed.  Constitutional:      Appearance: He is well-developed.  Pulmonary:     Effort: Pulmonary effort is normal.  Musculoskeletal: Normal range of motion.  Skin:    General: Skin is warm.  Neurological:     Mental Status: He is alert.     Neurovascular status intact muscle strength is adequate with patient found to have exquisite discomfort third metatarsal phalangeal joint right with inflammation fluid of the joint surface.  I also noted him to have moderate discomfort in the metatarsal distal shaft but it appears to be more within the joint than it does within the shaft itself.  Patient is found to have good digital perfusion and is well oriented x3     Assessment:  Acute capsulitis third MPJ right foot with also possibility for stress fracture even though it appears to be more of an acute inflammation     Plan:  H&P condition reviewed and x-rays reviewed with patient.  Today I did a proximal nerve block of the area 60 mg Xylocaine Marcaine mixture and sterile prep applied to the third MPJ I aspirated the joint getting out a small amount of clear fluid and injected quarter cc dexamethasone Kenalog and applied pad to reduce pressure on the joint along with rigid bottom shoes.  I discussed possibility for cast immobilization if symptoms persist and I will see him 2 weeks or earlier if any issues were to occur  X-rays indicate that there is no signs of fracture or bone pathology occurring

## 2018-12-08 ENCOUNTER — Ambulatory Visit (INDEPENDENT_AMBULATORY_CARE_PROVIDER_SITE_OTHER): Payer: PPO | Admitting: Podiatry

## 2018-12-08 ENCOUNTER — Other Ambulatory Visit: Payer: Self-pay

## 2018-12-08 ENCOUNTER — Encounter: Payer: Self-pay | Admitting: Podiatry

## 2018-12-08 VITALS — Temp 97.3°F

## 2018-12-08 DIAGNOSIS — M779 Enthesopathy, unspecified: Secondary | ICD-10-CM

## 2018-12-08 DIAGNOSIS — M79671 Pain in right foot: Secondary | ICD-10-CM | POA: Diagnosis not present

## 2018-12-08 NOTE — Progress Notes (Signed)
Subjective:   Patient ID: Dale Gonzalez, male   DOB: 68 y.o.   MRN: [id number]   HPI Patient presents stating the pain in the right foot is feeling some better and he is concerned about some of his different shoe gear as to whether without that may be part of the problem he is experiencing.  States the pain has receded but still is painful with deep palpation   ROS      Objective:  Physical Exam  Neurovascular status intact inflammatory changes third MPJ right still present but improved from previous visit with mild prominence of bone structure and padding and wrong position currently     Assessment:  Acute capsulitis with possibility for a pronounced bone structure third MPJ right     Plan:  H&P and spent a great deal of time reviewing condition discussing shoe gear options continue padding therapy possible orthotics immobilization.  Patient may return to normal activity and will be seen back as needed and was encouraged to call with concerns questions which may occur

## 2019-01-09 DIAGNOSIS — H524 Presbyopia: Secondary | ICD-10-CM | POA: Diagnosis not present

## 2019-01-09 DIAGNOSIS — H5213 Myopia, bilateral: Secondary | ICD-10-CM | POA: Diagnosis not present

## 2019-01-09 DIAGNOSIS — H52223 Regular astigmatism, bilateral: Secondary | ICD-10-CM | POA: Diagnosis not present

## 2019-02-02 DIAGNOSIS — H02401 Unspecified ptosis of right eyelid: Secondary | ICD-10-CM | POA: Diagnosis not present

## 2019-02-11 ENCOUNTER — Other Ambulatory Visit: Payer: Self-pay | Admitting: Cardiology

## 2019-02-11 MED FILL — LOSARTAN POTASSIUM 25 MG TA: 25 | 90 days supply | Qty: 90 | Fill #0

## 2019-02-11 MED FILL — BRILINTA 60 MG TABLET: 60 | 90 days supply | Qty: 180 | Fill #0

## 2019-02-11 MED FILL — METOPROLOL SUCCINATE ER 25: 25 | 90 days supply | Qty: 90 | Fill #0

## 2019-02-11 MED FILL — ASPIRIN LOW DOSE 81 MG TBEC: 81 | 90 days supply | Qty: 90 | Fill #0

## 2019-02-11 NOTE — Telephone Encounter (Signed)
Please fill

## 2019-02-12 MED FILL — ATORVASTATIN 80 MG TABLET: 80 | 90 days supply | Qty: 90 | Fill #0

## 2019-02-28 ENCOUNTER — Other Ambulatory Visit: Payer: Self-pay | Admitting: Cardiology

## 2019-03-02 ENCOUNTER — Other Ambulatory Visit: Payer: Self-pay | Admitting: Cardiology

## 2019-03-02 MED FILL — NITROGLYCERIN 0.4 MG TAB SL: 0.4 | 5 days supply | Qty: 25 | Fill #0

## 2019-03-02 MED FILL — EPINEPHRINE 0.3 MG AUTO-INJ: 0.3 | 30 days supply | Qty: 2 | Fill #0

## 2019-03-02 NOTE — Telephone Encounter (Signed)
Lmom

## 2019-03-02 NOTE — Telephone Encounter (Signed)
Please fill if necessary

## 2019-03-10 DIAGNOSIS — H02413 Mechanical ptosis of bilateral eyelids: Secondary | ICD-10-CM | POA: Diagnosis not present

## 2019-03-10 DIAGNOSIS — H02831 Dermatochalasis of right upper eyelid: Secondary | ICD-10-CM | POA: Diagnosis not present

## 2019-03-10 DIAGNOSIS — H0279 Other degenerative disorders of eyelid and periocular area: Secondary | ICD-10-CM | POA: Diagnosis not present

## 2019-03-10 DIAGNOSIS — H02834 Dermatochalasis of left upper eyelid: Secondary | ICD-10-CM | POA: Diagnosis not present

## 2019-03-10 DIAGNOSIS — H02423 Myogenic ptosis of bilateral eyelids: Secondary | ICD-10-CM | POA: Diagnosis not present

## 2019-03-10 DIAGNOSIS — H53483 Generalized contraction of visual field, bilateral: Secondary | ICD-10-CM | POA: Diagnosis not present

## 2019-03-12 DIAGNOSIS — H53483 Generalized contraction of visual field, bilateral: Secondary | ICD-10-CM | POA: Diagnosis not present

## 2019-03-26 DIAGNOSIS — Z125 Encounter for screening for malignant neoplasm of prostate: Secondary | ICD-10-CM | POA: Diagnosis not present

## 2019-03-26 DIAGNOSIS — E7800 Pure hypercholesterolemia, unspecified: Secondary | ICD-10-CM | POA: Diagnosis not present

## 2019-03-30 DIAGNOSIS — R82998 Other abnormal findings in urine: Secondary | ICD-10-CM | POA: Diagnosis not present

## 2019-03-30 DIAGNOSIS — I10 Essential (primary) hypertension: Secondary | ICD-10-CM | POA: Diagnosis not present

## 2019-04-06 DIAGNOSIS — J302 Other seasonal allergic rhinitis: Secondary | ICD-10-CM | POA: Diagnosis not present

## 2019-04-06 DIAGNOSIS — J4520 Mild intermittent asthma, uncomplicated: Secondary | ICD-10-CM | POA: Diagnosis not present

## 2019-04-06 DIAGNOSIS — I119 Hypertensive heart disease without heart failure: Secondary | ICD-10-CM | POA: Diagnosis not present

## 2019-04-06 DIAGNOSIS — I2510 Atherosclerotic heart disease of native coronary artery without angina pectoris: Secondary | ICD-10-CM | POA: Diagnosis not present

## 2019-04-06 DIAGNOSIS — Z0000 Encounter for general adult medical examination without abnormal findings: Secondary | ICD-10-CM | POA: Diagnosis not present

## 2019-04-06 DIAGNOSIS — K219 Gastro-esophageal reflux disease without esophagitis: Secondary | ICD-10-CM | POA: Diagnosis not present

## 2019-04-06 DIAGNOSIS — Z1339 Encounter for screening examination for other mental health and behavioral disorders: Secondary | ICD-10-CM | POA: Diagnosis not present

## 2019-04-06 DIAGNOSIS — D692 Other nonthrombocytopenic purpura: Secondary | ICD-10-CM | POA: Diagnosis not present

## 2019-04-06 DIAGNOSIS — E7800 Pure hypercholesterolemia, unspecified: Secondary | ICD-10-CM | POA: Diagnosis not present

## 2019-04-06 DIAGNOSIS — Z1331 Encounter for screening for depression: Secondary | ICD-10-CM | POA: Diagnosis not present

## 2019-04-09 DIAGNOSIS — Z23 Encounter for immunization: Secondary | ICD-10-CM | POA: Diagnosis not present

## 2019-05-07 MED FILL — LOSARTAN POTASSIUM 25 MG TA: 25 | 30 days supply | Qty: 30 | Fill #1

## 2019-05-07 MED FILL — BRILINTA 60 MG TABLET: 60 | 90 days supply | Qty: 180 | Fill #1

## 2019-05-07 MED FILL — ATORVASTATIN 80 MG TABLET: 80 | 90 days supply | Qty: 90 | Fill #1

## 2019-05-07 MED FILL — ASPIRIN 81 MG TBEC: 81 | 90 days supply | Qty: 90 | Fill #1

## 2019-05-07 MED FILL — METOPROLOL SUCCINATE ER 25: 25 | 90 days supply | Qty: 90 | Fill #1

## 2019-05-18 DIAGNOSIS — H0279 Other degenerative disorders of eyelid and periocular area: Secondary | ICD-10-CM | POA: Diagnosis not present

## 2019-05-18 DIAGNOSIS — H53483 Generalized contraction of visual field, bilateral: Secondary | ICD-10-CM | POA: Diagnosis not present

## 2019-05-18 DIAGNOSIS — H02831 Dermatochalasis of right upper eyelid: Secondary | ICD-10-CM | POA: Diagnosis not present

## 2019-05-18 DIAGNOSIS — H02834 Dermatochalasis of left upper eyelid: Secondary | ICD-10-CM | POA: Diagnosis not present

## 2019-05-18 DIAGNOSIS — H02413 Mechanical ptosis of bilateral eyelids: Secondary | ICD-10-CM | POA: Diagnosis not present

## 2019-05-18 DIAGNOSIS — H02423 Myogenic ptosis of bilateral eyelids: Secondary | ICD-10-CM | POA: Diagnosis not present

## 2019-06-19 ENCOUNTER — Encounter: Payer: Self-pay | Admitting: Family Medicine

## 2019-06-19 ENCOUNTER — Ambulatory Visit (INDEPENDENT_AMBULATORY_CARE_PROVIDER_SITE_OTHER): Payer: PPO | Admitting: Family Medicine

## 2019-06-19 DIAGNOSIS — R3129 Other microscopic hematuria: Secondary | ICD-10-CM

## 2019-06-19 DIAGNOSIS — E785 Hyperlipidemia, unspecified: Secondary | ICD-10-CM

## 2019-06-19 DIAGNOSIS — I2510 Atherosclerotic heart disease of native coronary artery without angina pectoris: Secondary | ICD-10-CM | POA: Diagnosis not present

## 2019-06-19 DIAGNOSIS — R946 Abnormal results of thyroid function studies: Secondary | ICD-10-CM

## 2019-06-19 DIAGNOSIS — R030 Elevated blood-pressure reading, without diagnosis of hypertension: Secondary | ICD-10-CM | POA: Diagnosis not present

## 2019-06-19 DIAGNOSIS — J45990 Exercise induced bronchospasm: Secondary | ICD-10-CM

## 2019-06-19 NOTE — Patient Instructions (Signed)
Health Maintenance Due  Topic Date Due  . PNA vac Low Risk Adult (2 of 2 - PPSV23) 08/01/2016    - Flu shot today - High dose flu shot today - declines for this season - will complete later in flu season (please let us know if you get this at another location so we can update your chart)

## 2019-06-19 NOTE — Assessment & Plan Note (Signed)
S:Was never diagnosed with HTN- losartan and metoprolol were started post MI.  A/P: continue current rx- we discussed changing diagnosis to HTN if listed on [place] records but he does not recall prior conversation about hyprertension

## 2019-06-19 NOTE — Assessment & Plan Note (Addendum)
S: compliant with atorvastatin 80mg  with LDL goal under 70- we are trying to get records but he is confident he is under that #  He actually was able to read off report and total cholesterol was 91 in august 2020, triglycerides 98, HDL 31, LDL 40.  A/P: Stable. Continue current medications.  Get records

## 2019-06-19 NOTE — Assessment & Plan Note (Signed)
S: [place] 07/2017- had 2 stents (woke up with chest pain in AM- lipids had slowly elevated over the years). Dr. Virgina Jock 08/11/2017 did 3 additional stents. ASA and Brilinta (considering stopping). Also on metoprolol and losartan per cards without diagnosis of HTN.  A/P: Stable- doing well- continue current medicine

## 2019-06-19 NOTE — Assessment & Plan Note (Signed)
S: Induced in cold weather- no issues in about 8 years as of 2020. No regular inhaler. Mask in cold weather helpful  A/P: no recent issues- happy to refill albuterol if needed

## 2019-06-19 NOTE — Progress Notes (Signed)
Phone [phone]  Subjective:  Virtual visit via Video note. Chief complaint: Chief Complaint  Patient presents with  . Establish Care previously seen by Dr. Domenick Gong      This visit type was conducted due to national recommendations for restrictions regarding the COVID-19 Pandemic (e.g. social distancing).  This format is felt to be most appropriate for this patient at this time balancing risks to patient and risks to population by having him in for in person visit.  No physical exam was performed (except for noted visual exam or audio findings with Telehealth visits).    Our team/I connected with [facility] at  1:00 PM EDT by a video enabled telemedicine application (doxy.me or caregility through epic) and verified that I am speaking with the correct person using two identifiers.  Location patient: [facility] Location provider: [facility], office Persons participating in the virtual visit:  patient  Our team/I discussed the limitations of evaluation and management by telemedicine and the availability of in person appointments. In light of current covid-19 pandemic, patient also understands that we are trying to protect them by minimizing in office contact if at all possible.  The patient expressed consent for telemedicine visit and agreed to proceed. Patient understands insurance will be billed.    Phone: [phone]   Subjective:  Patient presents today to establish care.  Prior patient of Dr. Osborne Casco [facility]- Dr. Linna Darner prior to that.  Chief Complaint  Patient presents with  . Establish Care, follow up CAD, HLD   See problem oriented charting  The following were reviewed and entered/updated in epic: Past Medical History:  Diagnosis Date  . Basal cell cancer    right cheek; ?left arm; Dr Wilhemina Bonito  . Chicken pox   . Coronary artery disease   . Hyperlipidemia    LDL goal = <115  . Myocardial infarction ([place]) 07/27/2017  . RAD (reactive airway disease) 1998    cold induced cough @ [place]  . Seasonal allergies    Patient Active Problem List   Diagnosis Date Noted  . Coronary artery disease s/p PTCA x 5 in 07/2017 10/10/2018    Priority: High  . Elevated blood pressure reading without diagnosis of hypertension 05/18/2014    Priority: Medium  . Nonspecific abnormal results of thyroid function study 01/12/2014    Priority: Medium  . Hyperlipidemia, unspecified 12/18/2006    Priority: Medium  . Exercise-induced asthma primarily cold induced 07/24/2013    Priority: Low  . Skin cancer, basal cell 12/27/2011    Priority: Low  . Microscopic hematuria 12/27/2011    Priority: Low  . Allergic rhinitis 12/19/2007    Priority: Low   Past Surgical History:  Procedure Laterality Date  . COLONOSCOPY  2003 & 2013   negative; Dr Sharlett Iles  . CORONARY ANGIOPLASTY WITH STENT PLACEMENT  07/27/2017; 08/08/2017  . CORONARY STENT INTERVENTION N/A 08/08/2017   Procedure: CORONARY STENT INTERVENTION;  Surgeon: Nigel Mormon, MD;  Location: [facility];  Service: Cardiovascular;  Laterality: N/A;  . eyelid surgery     bilateral- did not work well for right eyelid   . INTRAVASCULAR ULTRASOUND/IVUS N/A 08/08/2017   Procedure: Intravascular Ultrasound/IVUS;  Surgeon: Nigel Mormon, MD;  Location: [facility];  Service: Cardiovascular;  Laterality: N/A;  . LEFT HEART CATH AND CORONARY ANGIOGRAPHY N/A 08/08/2017   Procedure: LEFT HEART CATH AND CORONARY ANGIOGRAPHY;  Surgeon: Nigel Mormon, MD;  Location: [facility];  Service: Cardiovascular;  Laterality: N/A;  . MOHS SURGERY     "right face; ?left arm"  . TONSILLECTOMY    . WRIST GANGLION EXCISION Left    Dr Wonda Olds    Family History  Problem Relation Age of Onset  . Cancer Mother         cervical lymph nodes. mother has not shared primary cancer with him.   Marland Kitchen Heart disease Mother   . Hypertension Mother   . Hypertension Father   . Heart disease Father         bypass surgery  . Heart attack Father 79       Colostomy for ? diagnosis  . COPD Father   . Cancer Father        was not shared with patient primary cancer.   . Hyperlipidemia Brother   . Hypertension Brother   . Hyperlipidemia Brother   . Hypertension Brother   . 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   . Colon cancer Neg Hx   . Esophageal cancer Neg Hx   . Stomach cancer Neg Hx   . Rectal cancer Neg Hx   . Stroke Neg Hx   . Diabetes Neg Hx     Medications- reviewed and updated Current Outpatient Medications  Medication Sig Dispense Refill  . atorvastatin (LIPITOR) 80 MG tablet TAKE 1 TABLET BY MOUTH DAILY 90 tablet 2  . BRILINTA 60 MG TABS tablet     . EPINEPHrine (EPIPEN) 0.3 mg/0.3 mL SOAJ injection Inject 0.3 mLs (0.3 mg total) into the muscle once. (Patient taking differently: Inject 0.3 mg into the muscle once as needed (for anaphylactic reaction). ) 1 Device 5  . losartan (COZAAR) 25 MG tablet TAKE ONE (1) TABLET BY MOUTH EVERY DAY 90 tablet 3  . metoprolol succinate (TOPROL-XL) 25 MG 24 hr tablet     . nitroGLYCERIN (NITROSTAT) 0.4 MG SL tablet PLACE ONE TABLET UNDER THE TONGUE EVERY 5 MINUTES AS NEEDED FOR CHEST PAIN 30 tablet 3  . SM ASPIRIN ADULT LOW STRENGTH 81 MG EC tablet TAKE ONE (1) TABLET BY MOUTH EVERY DAY 90 tablet 2   No current facility-administered medications for this visit.    Allergies-reviewed and updated Allergies  Allergen Reactions  . Bee Venom Anaphylaxis and Swelling    Swelling of the throat  . Lisinopril Cough    Severe coughing    Social History   Social History Narrative   Married.       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    Works part time for his prior business- may stop in 2021- about 10 hours a week      Hobbies: enjoys spending time with his grandkids, regular exercise. Active in his church- [employer].     ROS--Full ROS was  completed Review of Systems  Constitutional: Negative for chills and fever.  HENT: Negative for hearing loss and tinnitus.   Eyes: Negative for blurred vision and double vision.  Respiratory: Negative for cough and shortness of breath.   Cardiovascular: Negative for chest pain and palpitations.  Gastrointestinal: Negative for heartburn and nausea.  Genitourinary: Negative for dysuria and frequency.  Musculoskeletal: Negative for myalgias and neck pain.  Skin: Negative for itching and rash.  Neurological: Negative for dizziness and headaches.  Endo/Heme/Allergies: Negative for polydipsia. Does not bruise/bleed easily.  Psychiatric/Behavioral: Negative for depression, substance abuse and suicidal ideas.    Objective:  BP 117/76   Pulse (!) 56   Ht 5\' 7"  (1.702 m)   Wt 164 lb (74.4 kg)   BMI 25.69 kg/m  self reported vitals Constitutional: Conversant, no acute distress Eyes: Anicteric sclera; slight lid lag on the right per baseline per patient Respiratory: Normal respiratory effort, respiratory rate 16 Cardiovascular: No pitting peripheral edema.   Skin: No rash, lesions or ulcers Musculoskeletal: No digital cyanosis.  Normal gait. Neurological: Normal speech, moves upper extremities without difficulty Psych: Intact judgment and insight.  Alert and oriented x3 with friendly affect.     Assessment and Plan  Specialist team 1. Dr. Wilhemina Bonito [facility] 2. Dr. Virgina Jock cardiology 3. Prior patient of Dr. Osborne Casco. Patient has had labs done by Dr. Osborne Casco at last physical in his office on 03/2019. I will mail him a release so that we can get all records. He has no problems or new medical issues at this time. He does exercise daily for at least 1 hour a day. In additions very active by golfing.   Coronary artery disease s/p PTCA x 5 in 07/2017 S: [place] MI 07/2017- had 2 stents (woke up with chest pain in AM- lipids had slowly elevated over the years). Dr. Virgina Jock 08/11/2017 did 3  additional stents. ASA and Brilinta (considering stopping). Also on metoprolol and losartan per cards without diagnosis of HTN.  A/P: Stable- doing well- continue current medicine    Hyperlipidemia, unspecified S: compliant with atorvastatin 80mg  with LDL goal under 70- we are trying to get records but he is confident he is under that #  He actually was able to read off report and total cholesterol was 91 in august 2020, triglycerides 98, HDL 31, LDL 40.  A/P: Stable. Continue current medications.  Get records     Elevated blood pressure reading without diagnosis of hypertension S:Was never diagnosed with HTN- losartan and metoprolol were started post MI.  A/P: continue current rx- we discussed changing diagnosis to HTN if listed on [place] records but he does not recall prior conversation about hyprertension    Nonspecific abnormal results of thyroid function study S:Mildly elevated TSH 2016 . No recent symptoms like unintentional weight gain, constipation, hair or nail changes  Lab Results  Component Value Date   TSH 4.78 (H) 01/24/2015  A/P: will review records when received- consider repeating TSH next visit if not recently done.    Microscopic hematuria S: Dr. Amalia Hailey has seen in past. No recent issues as of 2020   From Dr. Linna Darner notes "Urinalysis  reveals trace hematuria. In 2012 it revealed small to moderate microscopic hematuria. This was evaluated in the past  by Dr. Amalia Hailey, Urologist. It is most likely due to the number of hours he bikes which is 5-6 hours per week on average.  Since 2010 his PSA has ranged from a low of 0.4 to  a high of 1.32. In May of 2012 it was 0.78." He had a PSA with [facility] recently that was 0.404 in august 2020 A/P: When we next see patient in practice- will plan on getting UA- microscopic if needed    Exercise-induced asthma primarily cold induced S: Induced in cold weather- no issues in about 8 years as of 2020. No regular inhaler. Mask  in cold weather helpful  A/P: no recent issues- happy to refill albuterol if needed    Recommended follow up:  Schedule a physical august 2021 with me- 1 year out from last physical (03/26/2019 labs). Physical  04/06/2019.  Future Appointments  Date Time Provider [place]  10/08/2019 10:00 AM Patwardhan, Reynold Bowen, MD [facility] None   Lab/Order associations:   ICD-10-CM   1. Coronary artery disease involving native coronary artery of native heart without angina pectoris  I25.10   2. Microscopic hematuria  R31.29   3. Hyperlipidemia, unspecified hyperlipidemia type  E78.5   4. Elevated blood pressure reading without diagnosis of hypertension  R03.0   5. Nonspecific abnormal results of thyroid function study  R94.6   6. Exercise-induced asthma  J45.990    Time Stamp The duration of face-to-face time during this visit was greater than 30 minutes. Greater than 50% of this time was spent in counseling, explanation of diagnosis, planning of further management, and/or coordination of care including counseling on current medicine regimen, discussing medical history, discussing lipid guidelnes.    Return precautions advised.  Garret Reddish, MD

## 2019-06-19 NOTE — Assessment & Plan Note (Signed)
S:Mildly elevated TSH 2016 . No recent symptoms like unintentional weight gain, constipation, hair or nail changes  Lab Results  Component Value Date   TSH 4.78 (H) 01/24/2015  A/P: will review records when received- consider repeating TSH next visit if not recently done.

## 2019-06-19 NOTE — Assessment & Plan Note (Addendum)
S: Dr. Amalia Hailey has seen in past. No recent issues as of 2020   From Dr. Linna Darner notes "Urinalysis  reveals trace hematuria. In 2012 it revealed small to moderate microscopic hematuria. This was evaluated in the past  by Dr. Amalia Hailey, Urologist. It is most likely due to the number of hours he bikes which is 5-6 hours per week on average.  Since 2010 his PSA has ranged from a low of 0.4 to  a high of 1.32. In May of 2012 it was 0.78." He had a PSA with [facility] recently that was 0.404 in august 2020 A/P: When we next see patient in practice- will plan on getting UA- microscopic if needed

## 2019-06-20 MED FILL — LOSARTAN POTASSIUM 25 MG TA: 25 | 30 days supply | Qty: 30 | Fill #2

## 2019-07-22 ENCOUNTER — Other Ambulatory Visit: Payer: Self-pay | Admitting: Cardiology

## 2019-08-04 DIAGNOSIS — K219 Gastro-esophageal reflux disease without esophagitis: Secondary | ICD-10-CM | POA: Insufficient documentation

## 2019-08-24 ENCOUNTER — Encounter: Payer: Self-pay | Admitting: Family Medicine

## 2019-08-25 DIAGNOSIS — H53482 Generalized contraction of visual field, left eye: Secondary | ICD-10-CM | POA: Diagnosis not present

## 2019-08-25 DIAGNOSIS — H53481 Generalized contraction of visual field, right eye: Secondary | ICD-10-CM | POA: Diagnosis not present

## 2019-08-25 DIAGNOSIS — H53483 Generalized contraction of visual field, bilateral: Secondary | ICD-10-CM | POA: Diagnosis not present

## 2019-08-25 DIAGNOSIS — E7800 Pure hypercholesterolemia, unspecified: Secondary | ICD-10-CM | POA: Insufficient documentation

## 2019-09-01 DIAGNOSIS — H02831 Dermatochalasis of right upper eyelid: Secondary | ICD-10-CM | POA: Diagnosis not present

## 2019-09-01 DIAGNOSIS — H53483 Generalized contraction of visual field, bilateral: Secondary | ICD-10-CM | POA: Diagnosis not present

## 2019-09-01 DIAGNOSIS — H02835 Dermatochalasis of left lower eyelid: Secondary | ICD-10-CM | POA: Diagnosis not present

## 2019-09-01 DIAGNOSIS — H02413 Mechanical ptosis of bilateral eyelids: Secondary | ICD-10-CM | POA: Diagnosis not present

## 2019-09-01 DIAGNOSIS — H0279 Other degenerative disorders of eyelid and periocular area: Secondary | ICD-10-CM | POA: Diagnosis not present

## 2019-09-01 DIAGNOSIS — H02421 Myogenic ptosis of right eyelid: Secondary | ICD-10-CM | POA: Diagnosis not present

## 2019-09-01 DIAGNOSIS — H02834 Dermatochalasis of left upper eyelid: Secondary | ICD-10-CM | POA: Diagnosis not present

## 2019-09-01 DIAGNOSIS — H02832 Dermatochalasis of right lower eyelid: Secondary | ICD-10-CM | POA: Diagnosis not present

## 2019-09-01 DIAGNOSIS — H57813 Brow ptosis, bilateral: Secondary | ICD-10-CM | POA: Diagnosis not present

## 2019-09-09 ENCOUNTER — Telehealth (INDEPENDENT_AMBULATORY_CARE_PROVIDER_SITE_OTHER): Payer: Self-pay | Admitting: Cardiology

## 2019-09-09 ENCOUNTER — Other Ambulatory Visit: Payer: Self-pay

## 2019-09-09 VITALS — BP 116/70 | HR 71 | Ht 67.0 in | Wt 165.0 lb

## 2019-09-09 DIAGNOSIS — E785 Hyperlipidemia, unspecified: Secondary | ICD-10-CM

## 2019-09-09 DIAGNOSIS — I2510 Atherosclerotic heart disease of native coronary artery without angina pectoris: Secondary | ICD-10-CM | POA: Diagnosis not present

## 2019-09-09 NOTE — Progress Notes (Signed)
Patient is here for follow up visit.  Subjective:   _0  ID: Dale Gonzalez, male    DOB: 1950/11/22, 69 y.o.   MRN: [id number]   I connected with the patient on 09/09/2019 by a video enabled telemedicine application and verified that I am speaking with the correct person using two identifiers.     I discussed the limitations of evaluation and management by telemedicine and the availability of in person appointments. The patient expressed understanding and agreed to proceed.   This visit type was conducted due to national recommendations for restrictions regarding the COVID-19 Pandemic (e.g. social distancing).  This format is felt to be most appropriate for this patient at this time.  All issues noted in this document were discussed and addressed.  No physical exam was performed (except for noted visual exam findings with Tele health visits).  The patient has consented to conduct a Tele health visit and understands insurance will be billed.   Chief Complaint  Patient presents with  . Coronary Artery Disease    HPI  69 year old Caucasian with coronary artery disease (STEMI and staged multivessel PCI 07/2017).  Patient is doing very well and denies chest pain, shortness of breath, palpitations, leg edema, orthopnea, PND, TIA/syncope. He just returned from playing 18 hole golf today without any difficulty. He is currently on Aspirin 81 mg and Briinta 60 mg bid. He had some bruising after blepharoplasty, but otherwise does not have any bleeding issues.    Current Outpatient Medications on File Prior to Visit  Medication Sig Dispense Refill  . aspirin 81 MG EC tablet TAKE ONE (1) TABLET BY MOUTH EVERY DAY 90 tablet 1  . atorvastatin (LIPITOR) 80 MG tablet TAKE 1 TABLET BY MOUTH DAILY 90 tablet 2  . BRILINTA 60 MG TABS tablet TAKE 1 (ONE) TABLET BY MOUTH TWICE DAILY 180 tablet 1  . EPINEPHrine (EPIPEN) 0.3 mg/0.3 mL SOAJ injection Inject 0.3 mLs (0.3 mg total) into the muscle once.  (Patient taking differently: Inject 0.3 mg into the muscle once as needed (for anaphylactic reaction). ) 1 Device 5  . losartan (COZAAR) 25 MG tablet TAKE ONE (1) TABLET BY MOUTH EVERY DAY 90 tablet 3  . metoprolol succinate (TOPROL-XL) 25 MG 24 hr tablet TAKE 1 (ONE) TABLET BY MOUTH ONCE DAILY 90 tablet 1  . nitroGLYCERIN (NITROSTAT) 0.4 MG SL tablet PLACE ONE TABLET UNDER THE TONGUE EVERY 5 MINUTES AS NEEDED FOR CHEST PAIN 30 tablet 3   No current facility-administered medications on file prior to visit.    Cardiovascular studies:   03/2019: Glucose 87, BUN/Cr 19/0.9. EGFR 83. Na/K 138/4.2.  Chol 91, TG 98, HDL 31, LDL 40 Apolipoprotein B 45   Exercise sestamibi stress test 10/03/2018:  1. The patient performed treadmill exercise using Bruce protocol, completing 10:19 minutes. The patient completed an estimated workload of 12.3 METS, reaching 87% of the maximum predicted heart rate. Exercise capacity was excellent. Hemodynamic response was normal. Stress symptoms included fatigue. Stress electrocardiogram demonstrated normal sinus rhythm, normal resting conduction, old inferior infarct, no resting arrhythmias, and normal rest repolarization. No ischemic changes seen on stress electrocardiogram.  2. The overall quality of the study is good. Left ventricular cavity is noted to be normal on the rest and stress studies. Gated SPECT imaging demonstrates hypokinesis of the basal inferolateral and mid inferolateral myocardial wall(s). The left ventricular ejection fraction was calculated or visually estimated to be 47%. SPECT images show medium sized, mild intensity perfusion defect with minimal  reversibility. Findings suggest old infarct in LCx territory with no significant ischemia.  3. Intermediate risk study.  Cath 08/08/2017:   Complex LAD/DIag bifurcation PCI Diag 2.5 X 16 mm Synergy DES LAD 2.75 X 38 mm & 3.5 X 20 mm Synergy [facility] echocardiogram 08/09/2017:  - Left ventricle:  The cavity size was normal. The estimated   ejection fraction was in the range of 45% to 50%. Mild   inferolateral hypokinesis. - Mitral valve: There was mild regurgitation. - Normal right atrial pressure.  EKG 09/29/2018: SInus rhythm 69 bpm. Left anterior fascicular block Old inferior infarct. Poor R wave progression. No significant changes compared to previus EKG 08/06/2018.  Review of Systems  Cardiovascular: Negative for chest pain, dyspnea on exertion, leg swelling, palpitations and syncope.        Objective:    Vitals:   09/09/19 1513  BP: 116/70  Pulse: 71    Physical Exam  Constitutional: He appears well-developed and well-nourished.  Neck: No JVD present.  Cardiovascular: Normal rate, regular rhythm, normal heart sounds and intact distal pulses.  No murmur heard. Pulmonary/Chest: Effort normal and breath sounds normal. He has no wheezes. He has no rales.  Musculoskeletal:        General: No edema.  Nursing note and vitals reviewed.       Assessment & Recommendations:   69 year old Caucasian with coronary artery disease (STEMI and staged multivessel PCI 07/2017).  CAD: No angina symptoms. Tolerating Aspirin 81 mg daily and Brilinta 60 mg bid. Recommend till 07/2020 in absence of any spontaneous bleeding. Continue metoprolol succinate 25 mg daily. Will consider stopping after 07/2020. Continue losartan 25 mg daily.  Continue lipitor 80 mg daily. While his HDL chronically remains around 30, LDL is very well controlled at 40.    I will see him back in 1 year.    Nigel Mormon, MD [facility] Pager: [phone] Office: [phone] If no answer Cell [phone]

## 2019-09-19 ENCOUNTER — Ambulatory Visit: Payer: PPO

## 2019-09-27 ENCOUNTER — Ambulatory Visit: Payer: PPO

## 2019-09-30 ENCOUNTER — Encounter: Payer: Self-pay | Admitting: Family Medicine

## 2019-10-02 ENCOUNTER — Other Ambulatory Visit: Payer: Self-pay

## 2019-10-02 ENCOUNTER — Encounter: Payer: Self-pay | Admitting: Podiatry

## 2019-10-02 ENCOUNTER — Ambulatory Visit: Payer: PPO | Admitting: Podiatry

## 2019-10-02 VITALS — Temp 96.4°F

## 2019-10-02 DIAGNOSIS — M779 Enthesopathy, unspecified: Secondary | ICD-10-CM | POA: Diagnosis not present

## 2019-10-02 DIAGNOSIS — L03032 Cellulitis of left toe: Secondary | ICD-10-CM

## 2019-10-02 NOTE — Progress Notes (Signed)
Subjective:   Patient ID: Dale Gonzalez, male   DOB: 69 y.o.   MRN: [id number]   HPI Patient states he is developed redness and drainage around the left hallux and he was concerned about this and states he is put bacitracin on it and it seems to be somewhat improved.  Does not remember injury   ROS      Objective:  Physical Exam  Neurovascular status intact with patient found to have redness in the proximal nail fold left hallux with localized irritation of tissue but no active drainage or odor noted.  There is no proximal erythema noted and on the right I did note that the second MPJ is improved after previous treatment along with the third     Assessment:  Paronychia infection left hallux with capsulitis right improved     Plan:  H&P reviewed both conditions do not recommend treatment for the paronychia but soaks and Neosporin and if it were to get worse we will start him on antibiotics.  For the right do not recommend further treatment but reviewed with him

## 2019-10-08 ENCOUNTER — Ambulatory Visit: Payer: PPO | Admitting: Cardiology

## 2019-10-10 ENCOUNTER — Ambulatory Visit: Payer: PPO

## 2019-10-29 DIAGNOSIS — L821 Other seborrheic keratosis: Secondary | ICD-10-CM | POA: Diagnosis not present

## 2019-10-29 DIAGNOSIS — L720 Epidermal cyst: Secondary | ICD-10-CM | POA: Diagnosis not present

## 2019-10-29 DIAGNOSIS — D1801 Hemangioma of skin and subcutaneous tissue: Secondary | ICD-10-CM | POA: Diagnosis not present

## 2019-10-29 DIAGNOSIS — L570 Actinic keratosis: Secondary | ICD-10-CM | POA: Diagnosis not present

## 2019-10-29 DIAGNOSIS — D2271 Melanocytic nevi of right lower limb, including hip: Secondary | ICD-10-CM | POA: Diagnosis not present

## 2019-10-29 DIAGNOSIS — L603 Nail dystrophy: Secondary | ICD-10-CM | POA: Diagnosis not present

## 2019-10-29 DIAGNOSIS — Z85828 Personal history of other malignant neoplasm of skin: Secondary | ICD-10-CM | POA: Diagnosis not present

## 2019-10-29 DIAGNOSIS — D692 Other nonthrombocytopenic purpura: Secondary | ICD-10-CM | POA: Diagnosis not present

## 2019-10-29 DIAGNOSIS — L814 Other melanin hyperpigmentation: Secondary | ICD-10-CM | POA: Diagnosis not present

## 2019-10-29 DIAGNOSIS — L820 Inflamed seborrheic keratosis: Secondary | ICD-10-CM | POA: Diagnosis not present

## 2019-11-20 ENCOUNTER — Other Ambulatory Visit: Payer: Self-pay | Admitting: Cardiology

## 2020-01-04 ENCOUNTER — Ambulatory Visit (INDEPENDENT_AMBULATORY_CARE_PROVIDER_SITE_OTHER): Payer: PPO | Admitting: Family Medicine

## 2020-01-04 ENCOUNTER — Encounter: Payer: Self-pay | Admitting: Family Medicine

## 2020-01-04 ENCOUNTER — Telehealth: Payer: Self-pay | Admitting: Family Medicine

## 2020-01-04 ENCOUNTER — Other Ambulatory Visit: Payer: Self-pay

## 2020-01-04 VITALS — BP 122/70 | HR 58 | Temp 97.7°F | Ht 67.0 in | Wt 167.0 lb

## 2020-01-04 DIAGNOSIS — S86811A Strain of other muscle(s) and tendon(s) at lower leg level, right leg, initial encounter: Secondary | ICD-10-CM | POA: Diagnosis not present

## 2020-01-04 HISTORY — PX: ROOT CANAL: SHX2363

## 2020-01-04 NOTE — Patient Instructions (Signed)
Gastrocnemius tear vs. Plantaris strain.   1) rest until you can walk without a limp.  2) get over the counter voltaren gel to apply to area 4x/day 3) ice area 4x/day for 20 minutes 4) heel raises will sometimes help pain 5) get a compression sleeve.  6) referral to sports med so they can ultrasound.Marland Kitchen

## 2020-01-04 NOTE — Progress Notes (Signed)
Patient: Dale Gonzalez MRN: [id number] DOB: October 30, 1950 PCP: Marin Olp, MD     Subjective:  Chief Complaint  Patient presents with  . Left calf injury    Pt says that he stepped in a hole on Saturday.    HPI: The patient is a 69 y.o. male who presents today for right calf pain. He was playing a round of golf and he stepped into a hole and he hyperextended his right calf. He has point tenderness at the bottom of his right gastrocnemius. He felt a pop/heard a pop. He states it feels fine if he elevates his leg. He is on tylenol for a root canal. If he steps on his foot wrong he states it can be a 7-8/10. If he were to extend it would be a 10/10. He has swelling down into his ankle. No redness. He is using a crutch to walk. Can fully weight bear standing, but can not walk without the crutch.    Review of Systems  Cardiovascular: Positive for leg swelling.  Musculoskeletal: Positive for myalgias.       Right calf pain   Skin: Negative for color change.    Allergies Patient is allergic to bee venom and lisinopril.  Past Medical History Patient  has a past medical history of Basal cell cancer, Chicken pox, Coronary artery disease, Hyperlipidemia, Myocardial infarction ([place]) (07/27/2017), RAD (reactive airway disease) (1998), and Seasonal allergies.  Surgical History Patient  has a past surgical history that includes Tonsillectomy; Colonoscopy (2003 & 2013); Wrist ganglion excision (Left); Coronary angioplasty with stent (07/27/2017; 08/08/2017); Mohs surgery; LEFT HEART CATH AND CORONARY ANGIOGRAPHY (N/A, 08/08/2017); CORONARY STENT INTERVENTION (N/A, 08/08/2017); Intravascular Ultrasound/IVUS (N/A, 08/08/2017); eyelid surgery; and Root canal (01/04/2020).  Family History Pateint's family history includes COPD in his father; Cancer in his father and mother; Heart attack (age of onset: 67) in his father; Heart disease in his father and mother; Hyperlipidemia in his brother and  brother; Hypertension in his brother, brother, father, and mother; Post-traumatic stress disorder in his son.  Social History Patient  reports that he has never smoked. He has never used smokeless tobacco. He reports current alcohol use of about 1.0 standard drinks of alcohol per week. He reports that he does not use drugs.    Objective: Vitals:   01/04/20 1305  BP: 122/70  Pulse: (!) 58  Temp: 97.7 F (36.5 C)  TempSrc: Temporal  SpO2: 99%  Weight: 167 lb (75.8 kg)  Height: 5\' 7"  (1.702 m)    Body mass index is 26.16 kg/m.  Physical Exam Vitals reviewed.  Constitutional:      Appearance: Normal appearance. He is normal weight.  HENT:     Head: Normocephalic and atraumatic.  Pulmonary:     Effort: Pulmonary effort is normal.  Musculoskeletal:        General: Swelling and tenderness present.     Right lower leg: Edema present.     Comments: He has point tenderness at inferior/medial aspect of gastrocnemius/insertion of plantaris tendon. Edema of entire calf down to ankle. No erythema or warmth.   Neurological:     Mental Status: He is alert.       15.5" left calf circumference 16" right calf circumference   Assessment/plan: 1. Strain of calf muscle, right, initial encounter Gastro strain/tear vs. Plantaris strain. Recommended he get a calf sleeve, ice 4x/day, voltaren gel and will have him seen urgently by sports medicine for ultrasound to make sure no walking boot or  further intervention is needed as well as routine f/u.  He has appointment with dr. Georgina Snell in the AM.  - Ambulatory referral to Sports Medicine    This visit occurred during the SARS-CoV-2 public health emergency.  Safety protocols were in place, including screening questions prior to the visit, additional usage of staff PPE, and extensive cleaning of exam room while observing appropriate contact time as indicated for disinfecting solutions.     Return if symptoms worsen or fail to  improve.     Orma Flaming, MD [place]  01/04/2020

## 2020-01-04 NOTE — Telephone Encounter (Signed)
Patient calling , stating he had stepped in a hole playing golf, leg was swollen , sent him to triage nurse, she wanted him to be seen in a 4 hour period. Offered a 9:20 patient declined appt due to dentist appt. Spoke with Joellen and she recommended to go to Urgent care. jk

## 2020-01-05 ENCOUNTER — Encounter: Payer: Self-pay | Admitting: Family Medicine

## 2020-01-05 ENCOUNTER — Ambulatory Visit: Payer: PPO | Admitting: Family Medicine

## 2020-01-05 ENCOUNTER — Ambulatory Visit: Payer: Self-pay

## 2020-01-05 VITALS — BP 140/88 | HR 65 | Ht 67.0 in | Wt 167.0 lb

## 2020-01-05 DIAGNOSIS — M79661 Pain in right lower leg: Secondary | ICD-10-CM

## 2020-01-05 MED ORDER — NITROGLYCERIN 0.2 MG/HR TD PT24: MEDICATED_PATCH | TRANSDERMAL | 1 refills | Status: DC

## 2020-01-05 NOTE — Patient Instructions (Addendum)
Thank you for coming in today. I think you have a calf tear/strain or a plantaris tear.  Either should get better without significant workup or special treatment.  Use body helix full calf sleeve.  Do the eccentric heel calf exercises.  Go from up to down slowly.  Make super your heels go lower than the thing you stand on.  Ok to use cam walker boot if needed.   Recheck with me in 3 week or so if not better.  Use over the counter Voltaren gel for the first week then start nitro patches.   Nitroglycerin Protocol   Apply 1/4 nitroglycerin patch to affected area daily.  Change position of patch within the affected area every 24 hours.  You may experience a headache during the first 1-2 weeks of using the patch, these should subside.  If you experience headaches after beginning nitroglycerin patch treatment, you may take your preferred over the counter pain reliever.  Another side effect of the nitroglycerin patch is skin irritation or rash related to patch adhesive.  Please notify our office if you develop more severe headaches or rash, and stop the patch.  Tendon healing with nitroglycerin patch may require 12 to 24 weeks depending on the extent of injury.  Men should not use if taking Viagra, Cialis, or Levitra.   Do not use if you have migraines or rosacea.     Medial Head Gastrocnemius Tear Rehab Ask your health care provider which exercises are safe for you. Do exercises exactly as told by your health care provider and adjust them as directed. It is normal to feel mild stretching, pulling, tightness, or discomfort as you do these exercises. Stop right away if you feel sudden pain or your pain gets worse. Do not begin these exercises until told by your health care provider. Stretching and range-of-motion exercises These exercises warm up your muscles and joints and improve the movement and flexibility of your lower leg. These exercises also help to relieve pain and  stiffness. Gastrocnemius stretch This exercise is also called a calf stretch. It stretches the muscles in the back of the lower leg (gastrocnemius). 1. Sit with your left / right leg extended. 2. Loop a belt or towel around the ball of your left / right foot. The ball of your foot is on the walking surface, right under your toes. 3. Hold both ends of the belt or towel. 4. Keep your left / right ankle and foot relaxed and keep your knee straight while you use the belt or towel to pull your foot and ankle toward you. Stop at the first point of resistance. 5. Hold this position for __________ seconds. Repeat __________ times. Complete this exercise __________ times a day. Ankle alphabet  1. Sit with your left / right leg supported at the lower leg. ? Do not rest your foot on anything. ? Make sure your foot has room to move freely. 2. Think of your left / right foot as a paintbrush. ? Move your foot to trace each letter of the alphabet in the air. Keep your hip and knee still while you trace. ? Make the letters as large as you can without feeling discomfort. 3. Trace every letter of the alphabet. Repeat __________ times. Complete this exercise __________ times a day. Strengthening exercises These exercises build strength and endurance in your lower leg. Endurance is the ability to use your muscles for a long time, even after they get tired. Plantar flexion with band, seated  1. Sit on the floor with your left / right leg extended. 2. Loop a rubber exercise band or tube around the ball of your left / right foot. The ball of your foot is on the walking surface, right under your toes. The band or tube should be slightly tense when your foot is relaxed. If the band or tube slips, you can put on your shoe or put a washcloth between the band and your foot to help it stay in place. 3. While holding both ends of the band or tube, slowly point your toes downward, pushing them away from you (plantar  flexion). 4. Hold this position for __________ seconds. 5. Slowly release the tension in the band or tube, controlling smoothly until your foot is back to the starting position. Repeat __________ times. Complete this exercise __________ times a day. Plantar flexion, standing  1. Stand with your feet shoulder-width apart. 2. Place your hands on a wall or table to steady yourself as needed, but try not to use it for support. 3. Rise up on your toes (plantar flexion). 4. If this exercise is too easy, try these options: ? Shift your weight toward your left / right leg until you feel challenged. ? If told by your health care provider, stand on your left / right foot only. 5. Hold this position for __________ seconds. Repeat __________ times. Complete this exercise __________ times a day. Eccentric plantar flexion  1. Stand on the balls of your feet on the edge of a step. The ball of your foot is on the walking surface, right under your toes. ? Do not put your heels on the step. ? For balance, rest your hands on the wall or on a railing. Try not to lean on it for support. 2. Rise up onto the balls of your feet, using both legs to help. 3. Keeping your heels up, slowly shift all of your weight to your left / right foot and lift your other foot off the step. 4. Slowly lower your left / right heel so it drops below the level of the step. Lowering your heel under tension is called eccentric plantar flexion. You will feel a slight stretch in your left / right calf. 5. Put your other foot back onto the step before returning to the start position. Repeat __________ times. Complete this exercise __________ times a day. This information is not intended to replace advice given to you by your health care provider. Make sure you discuss any questions you have with your health care provider. Document Revised: 06/03/2019 Document Reviewed: 06/16/2018 Elsevier Patient Education  2020 [employer].  I  recommend you obtained a compression sleeve to help with your joint problems. There are many options on the market however I recommend obtaining a calf Body Helix compression sleeve.  You can find information (including how to appropriate measure yourself for sizing) can be found at www.Body http://www.lambert.com/.  Many of these products are health savings account (HSA) eligible.   You can use the compression sleeve at any time throughout the day but is most important to use while being active as well as for 2 hours post-activity.   It is appropriate to ice following activity with the compression sleeve in place.

## 2020-01-05 NOTE — Progress Notes (Signed)
Subjective:    I'm seeing this patient as a consultation for:  Dr. Rogers Blocker. Note will be routed back to referring provider/PCP.  CC: L calf strain  I, Molly Weber, LAT, ATC, am serving as scribe for Dr. Lynne Leader.  HPI: Pt is a 69 y/o male presenting w/ c/o L calf pain and lower leg swelling after stepping in a hole last Saturday, May 15th while playing golf.  Pt reports hearing/feeling a pop at the time of injury.  He has been ambulating w/ a crutch since the injury due to pain w/ weight bearing.  He locates to his mid calf.  He rates his pain as mild at rest and severe w/ attempts at weight bearing and describes his pain as sharp .  Patient is retired but physically active.  He plays golf at least once a week and does body pump home exercise almost daily.  Additionally he tries to walk at least 4 miles every day.  His current pain and injury are interfering with his ability to do most any exercise.  He rates his pain as severe and difficulty with walking.  Radiating pain: No  Swelling: yes in R lower leg Aggravating factors: R LE weight bearing and pressure to his R mid-calf Treatments tried: RICE; crutch for ambulation; Tylenol; Voltaren gel  Past medical history, Surgical history, Family history, Social history, Allergies, and medications have been entered into the medical record, reviewed.  History MI with CAD.  Coronary angioplasty on Brilinta and 81 mg aspirin.  Review of Systems: No new headache, visual changes, nausea, vomiting, diarrhea, constipation, dizziness, abdominal pain, skin rash, fevers, chills, night sweats, weight loss, swollen lymph nodes, body aches, joint swelling, muscle aches, chest pain, shortness of breath, mood changes, visual or auditory hallucinations.   Objective:    Vitals:   01/05/20 0757  BP: 140/88  Pulse: 65  SpO2: 99%   General: Well Developed, well nourished, and in no acute distress.  Neuro/Psych: Alert and oriented x3, extra-ocular muscles  intact, able to move all 4 extremities, sensation grossly intact. Skin: Warm and dry, no rashes noted.  Respiratory: Not using accessory muscles, speaking in full sentences, trachea midline.  Cardiovascular: Pulses palpable, no extremity edema. Abdomen: Does not appear distended. MSK: Left calf slightly swollen compared to right slight bruising around medial ankle otherwise normal-appearing Area of mild fluctuance and tenderness at musculotendinous junction gastrocnemius midportion between medial lateral gastrocnemius heads. Lower leg is nontender otherwise. Decreased foot and ankle motion to plantar flexion and dorsiflexion with pain with resisted plantarflexion and stretching dorsiflexion. No gaps palpated along Achilles tendon or medial or lateral heads of gastrocnemius. Decreased strength to foot plantarflexion with pain and guarding. Pulses cap refill and sensation are intact distally. Antalgic gait.   Lab and Radiology Results  Diagnostic Limited MSK Ultrasound of: Left calf Achilles tendon intact distally at insertion on the calcaneus. Area of hyperechoic change and hypoechoic marbling subcutaneous tissue at musculotendinous junction midpoint between medial and lateral head gastrocnemius. No large muscle tear present medial or lateral gastrocnemius heads or visible portion of soleus muscle. I was unable to find the plantaris tendon on transverse imaging of Achilles tendon. Impression: Strain/partial tear gastrocnemius muscle tendinous junction versus plantaris tear.   Impression and Recommendations:    Assessment and Plan: 69 y.o. male with  Left calf strain.  Patient may have suffered a plantaris tear or simple calf strain.  Regardless he should improve with conservative management.  Plan for calf sleeve eccentric  exercises and advancing activity as tolerated.  Advised that he may want to purchase an over-the-counter cam walker boot if he needs for comfort although I do not think  he will need it. Additionally discussed nitroglycerin patches.  He does have a history of CAD and does have sublingual nitroglycerin that he can take as needed however he is not had to take sublingual nitroglycerin in years and is able to exercise extensively without any chest pain or heart symptoms.  I think nitroglycerin patches will be more helpful than harmful in this context. However would like a little bit more healing to happen before starting nitroglycerin patches.  Recommend Voltaren gel for the first week then followed by nitroglycerin patches.  If not improving patient will let me know and we will proceed with formal physical therapy referral.  Additionally if patient is having worsening symptoms and swelling let me know and I will send for DVT ultrasound.  Right now at no signs of DVT however that certainly is a risk with his injury and decreased mobility.Marland Kitchen  PDMP not reviewed this encounter. Orders Placed This Encounter  Procedures  . [place] LIMITED JOINT SPACE STRUCTURES LOW RIGHT(NO LINKED CHARGES)    Order Specific Question:   Reason for Exam (SYMPTOM  OR DIAGNOSIS REQUIRED)    Answer:   eval calf pain    Order Specific Question:   Preferred imaging location?    Answer:   [facility]   Meds ordered this encounter  Medications  . nitroGLYCERIN (NITRODUR - DOSED IN MG/24 HR) 0.2 mg/hr patch    Sig: Apply 1/4 patch daily to tendon for tendonitis.    Dispense:  30 patch    Refill:  1    Discussed warning signs or symptoms. Please see discharge instructions. Patient expresses understanding.   The above documentation has been reviewed and is accurate and complete Lynne Leader, M.D.

## 2020-02-09 ENCOUNTER — Other Ambulatory Visit: Payer: Self-pay | Admitting: Cardiology

## 2020-02-23 DIAGNOSIS — Z09 Encounter for follow-up examination after completed treatment for conditions other than malignant neoplasm: Secondary | ICD-10-CM | POA: Diagnosis not present

## 2020-02-23 DIAGNOSIS — H02421 Myogenic ptosis of right eyelid: Secondary | ICD-10-CM | POA: Diagnosis not present

## 2020-03-25 ENCOUNTER — Telehealth: Payer: Self-pay | Admitting: Family Medicine

## 2020-03-25 NOTE — Progress Notes (Signed)
°  Chronic Care Management   Outreach Note  03/25/2020 Name: Dale Gonzalez MRN: [id number] DOB: 1951/05/01  Referred by: Marin Olp, MD Reason for referral : No chief complaint on file.   An unsuccessful telephone outreach was attempted today. The patient was referred to the pharmacist for assistance with care management and care coordination.   Follow Up Plan:   Earney [facility] Scheduler

## 2020-03-28 ENCOUNTER — Ambulatory Visit (INDEPENDENT_AMBULATORY_CARE_PROVIDER_SITE_OTHER): Payer: PPO

## 2020-03-28 DIAGNOSIS — Z0000 Encounter for general adult medical examination without abnormal findings: Secondary | ICD-10-CM

## 2020-03-28 NOTE — Progress Notes (Signed)
Virtual Visit via Telephone Note  I connected with  Dale Gonzalez on 03/28/20 at  8:00 AM EDT by telephone and verified that I am speaking with the correct person using two identifiers.  Medicare Annual Wellness visit completed telephonically due to Covid-19 pandemic.   Persons participating in this call: This Health Coach and this patient.   Location: Patient: Home Provider: Office   I discussed the limitations, risks, security and privacy concerns of performing an evaluation and management service by telephone and the availability of in person appointments. The patient expressed understanding and agreed to proceed.  Unable to perform video visit due to video visit attempted and failed and/or patient does not have video capability.   Some vital signs may be absent or patient reported.   Willette Brace, LPN    Subjective:   Dale Gonzalez is a 69 y.o. male who presents for an Initial Medicare Annual Wellness Visit.  Review of Systems     Cardiac Risk Factors include: hypertension;dyslipidemia;male gender     Objective:    There were no vitals filed for this visit. There is no height or weight on file to calculate BMI.  Advanced Directives 03/28/2020 08/08/2017  Does Patient Have a Medical Advance Directive? Yes No  Type of Paramedic of [place];Living will -  Copy of [place] in Chart? No - copy requested -  Would patient like information on creating a medical advance directive? - No - Patient declined    Current Medications (verified) Outpatient Encounter Medications as of 03/28/2020  Medication Sig  . atorvastatin (LIPITOR) 80 MG tablet TAKE 1 TABLET BY MOUTH DAILY  . BRILINTA 60 MG TABS tablet TAKE 1 (ONE) TABLET BY MOUTH TWICE DAILY  . EPINEPHrine (EPIPEN) 0.3 mg/0.3 mL SOAJ injection Inject 0.3 mLs (0.3 mg total) into the muscle once. (Patient taking differently: Inject 0.3 mg into the muscle once as needed (for  anaphylactic reaction). )  . losartan (COZAAR) 25 MG tablet TAKE ONE (1) TABLET BY MOUTH EVERY DAY  . metoprolol succinate (TOPROL-XL) 25 MG 24 hr tablet TAKE 1 TABLET BY MOUTH ONCE DAILY  . nitroGLYCERIN (NITROSTAT) 0.4 MG SL tablet PLACE ONE TABLET UNDER THE TONGUE EVERY 5 MINUTES AS NEEDED FOR CHEST PAIN  . SM ASPIRIN ADULT LOW STRENGTH 81 MG EC tablet TAKE ONE (1) TABLET BY MOUTH EVERY DAY  . [DISCONTINUED] nitroGLYCERIN (NITRODUR - DOSED IN MG/24 HR) 0.2 mg/hr patch Apply 1/4 patch daily to tendon for tendonitis. (Patient not taking: Reported on 03/28/2020)   No facility-administered encounter medications on file as of 03/28/2020.    Allergies (verified) Bee venom and Lisinopril   History: Past Medical History:  Diagnosis Date  . Basal cell cancer    right cheek; ?left arm; Dr Wilhemina Bonito  . Chicken pox   . Coronary artery disease   . Hyperlipidemia    LDL goal = <115  . Myocardial infarction ([place]) 07/27/2017  . RAD (reactive airway disease) 1998   cold induced cough @ [place] [facility]  . Seasonal allergies    Past Surgical History:  Procedure Laterality Date  . COLONOSCOPY  2003 & 2013   negative; Dr Sharlett Iles  . CORONARY ANGIOPLASTY WITH STENT PLACEMENT  07/27/2017; 08/08/2017  . CORONARY STENT INTERVENTION N/A 08/08/2017   Procedure: CORONARY STENT INTERVENTION;  Surgeon: Nigel Mormon, MD;  Location: [facility];  Service: Cardiovascular;  Laterality: N/A;  . EYE SURGERY     right eye  lid  dropping procedure 9/21  . eyelid surgery     bilateral- did not work well for right eyelid   . INTRAVASCULAR ULTRASOUND/IVUS N/A 08/08/2017   Procedure: Intravascular Ultrasound/IVUS;  Surgeon: Nigel Mormon, MD;  Location: [facility];  Service: Cardiovascular;  Laterality: N/A;  . LEFT HEART CATH AND CORONARY ANGIOGRAPHY N/A 08/08/2017   Procedure: LEFT HEART CATH AND CORONARY ANGIOGRAPHY;  Surgeon: Nigel Mormon, MD;  Location: [facility];  Service:  Cardiovascular;  Laterality: N/A;  . MOHS SURGERY     "right face; ?left arm"  . ROOT CANAL  01/04/2020   Bilateral  . TONSILLECTOMY    . WRIST GANGLION EXCISION Left    Dr Wonda Olds   Family History  Problem Relation Age of Onset  . Cancer Mother         cervical lymph nodes. mother has not shared primary cancer with him.   Marland Kitchen Heart disease Mother   . Hypertension Mother   . Hypertension Father   . Heart disease Father        bypass surgery  . Heart attack Father 65       Colostomy for ? diagnosis  . COPD Father   . Cancer Father        was not shared with patient primary cancer.   . Hyperlipidemia Brother   . Hypertension Brother   . Hyperlipidemia Brother   . Hypertension Brother   . 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   . Colon cancer Neg Hx   . Esophageal cancer Neg Hx   . Stomach cancer Neg Hx   . Rectal cancer Neg Hx   . Stroke Neg Hx   . Diabetes Neg Hx    Social History   Socioeconomic History  . Marital status: Married    Spouse name: Not on file  . Number of children: 2  . Years of education: Not on file  . Highest education level: Not on file  Occupational History  . Occupation: Retired  Tobacco Use  . Smoking status: Never Smoker  . Smokeless tobacco: Never Used  Vaping Use  . Vaping Use: Never used  Substance and Sexual Activity  . Alcohol use: Yes    Alcohol/week: 1.0 standard drink    Types: 1 Shots of liquor per week    Comment: occ  . Drug use: No  . Sexual activity: Not Currently  Other Topics Concern  . Not on file  Social History Narrative   Married.       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    Works part time for his prior business- may stop in 2021- about 10 hours a week      Hobbies: enjoys spending time with his grandkids, regular exercise. Active in his church- [employer].    Social Determinants of Health   Financial Resource  Strain: Low Risk   . Difficulty of Paying Living Expenses: Not hard at all  Food Insecurity: No Food Insecurity  . Worried About Charity fundraiser in the Last Year: Never true  . Ran Out of Food in the Last Year: Never true  Transportation Needs: No Transportation Needs  . Lack of Transportation (Medical): No  . Lack of Transportation (Non-Medical): No  Physical Activity: Sufficiently Active  . Days of Exercise per Week: 6 days  . Minutes of Exercise per  Session: 60 min  Stress: No Stress Concern Present  . Feeling of Stress : Only a little  Social Connections: Socially Integrated  . Frequency of Communication with Friends and Family: More than three times a week  . Frequency of Social Gatherings with Friends and Family: More than three times a week  . Attends Religious Services: More than 4 times per year  . Active Member of Clubs or Organizations: Yes  . Attends Archivist Meetings: More than 4 times per year  . Marital Status: Married    Tobacco Counseling Counseling given: Not Answered   Clinical Intake:  Pre-visit preparation completed: Yes  Pain : No/denies pain     BMI - recorded: 26.16 Nutritional Status: BMI 25 -29 Overweight Nutritional Risks: None Diabetes: No  How often do you need to have someone help you when you read instructions, pamphlets, or other written materials from your doctor or pharmacy?: 1 - Never  Diabetic?No  Interpreter Needed?: No  Information entered by :: Charlott Rakes, LPN   Activities of Daily Living In your present state of health, do you have any difficulty performing the following activities: 03/28/2020 06/19/2019  Hearing? N N  Vision? N N  Difficulty concentrating or making decisions? N N  Walking or climbing stairs? N N  Dressing or bathing? N N  Doing errands, shopping? N N  Preparing Food and eating ? N -  Using the Toilet? N -  In the past six months, have you accidently leaked urine? N -  Do you have  problems with loss of bowel control? N -  Managing your Medications? N -  Managing your Finances? N -  Housekeeping or managing your Housekeeping? N -  Some recent data might be hidden    Patient Care Team: Marin Olp, MD as PCP - General (Family Medicine) Nigel Mormon, MD as Consulting Physician (Cardiology) Danella Sensing, MD as Consulting Physician (Dermatology) Luberta Mutter, MD as Consulting Physician (Ophthalmology) Paulla Dolly Tamala Fothergill, DPM as Consulting Physician (Podiatry) Solon Augusta, MD as Attending Physician (Ophthalmology)  Indicate any recent Medical Services you may have received from other than [facility] providers in the past year (date may be approximate).     Assessment:   This is a routine wellness examination for Dale Gonzalez.  Hearing/Vision screen  Hearing Screening   125Hz  250Hz  500Hz  1000Hz  2000Hz  3000Hz  4000Hz  6000Hz  8000Hz   Right ear:           Left ear:           Comments: Pt states no difficulty at this time  Vision Screening Comments: [facility] is the optometrist and visits annually  Dietary issues and exercise activities discussed: Current Exercise Habits: Home exercise routine, Type of exercise: walking, Time (Minutes): 60, Frequency (Times/Week): 6, Weekly Exercise (Minutes/Week): 360  Goals    . Patient Stated     Exercise and activity       Depression Screen PHQ 2/9 Scores 03/28/2020 06/19/2019 10/07/2017 09/16/2017  PHQ - 2 Score 0 0 0 0  PHQ- 9 Score - 0 - -    Fall Risk Fall Risk  03/28/2020 01/04/2020 06/19/2019 09/12/2017  Falls in the past year? 0 0 0 No  Number falls in past yr: 0 - 0 -  Injury with Fall? 0 - 0 -  Risk for fall due to : Impaired vision - - -  Follow up Falls prevention discussed - - -    Any stairs in or around the home?  Yes  If so, are there any without handrails? No  Home free of loose throw rugs in walkways, pet beds, electrical cords, etc? Yes  Adequate lighting in your home to reduce risk of  falls? Yes   ASSISTIVE DEVICES UTILIZED TO PREVENT FALLS:  Life alert? No  Use of a cane, walker or w/c? No  Grab bars in the bathroom? No  Shower chair or bench in shower? No  Elevated toilet seat or a handicapped toilet? Yes   TIMED UP AND GO:  Was the test performed? No .    Cognitive Function:     6CIT Screen 03/28/2020  Count back from 20 0 points  Months in reverse 0 points  Repeat phrase 0 points    Immunizations Immunization History  Administered Date(s) Administered  . Influenza Whole 07/08/2007, 05/20/2008, 08/04/2008, 06/01/2010  . Influenza,inj,Quad PF,6+ Mos 05/18/2014  . Influenza-Unspecified 07/01/2017, 04/08/2019  . PFIZER SARS-COV-2 Vaccination 09/05/2019, 09/23/2019  . Pneumococcal Conjugate-13 08/02/2015, 03/27/2016  . Td 12/24/2008  . Tdap 02/18/2014  . Zoster 01/04/2011  . Zoster Recombinat (Shingrix) 07/15/2017, 10/17/2017    TDAP status: Up to date Flu Vaccine status: Up to date Pneumococcal Vaccine screening discussed, will address at  04/19/20 Physical visit Covid-19 vaccine status: Completed vaccines  Qualifies for Shingles Vaccine? Yes   Zostavax completed Yes   Shingrix Completed?: Yes  Screening Tests Health Maintenance  Topic Date Due  . PNA vac Low Risk Adult (2 of 2 - PPSV23) 03/27/2017  . INFLUENZA VACCINE  03/20/2020  . COLONOSCOPY  12/10/2021  . TETANUS/TDAP  02/19/2024  . COVID-19 Vaccine  Completed  . Hepatitis C Screening  Completed    Health Maintenance  Health Maintenance Due  Topic Date Due  . PNA vac Low Risk Adult (2 of 2 - PPSV23) 03/27/2017  . INFLUENZA VACCINE  03/20/2020    Colorectal cancer screening: Completed 12/11/11. Repeat every 10 years    Additional Screening:  Hepatitis C Screening: Completed 08/02/15  Vision Screening: Recommended annual ophthalmology exams for early detection of glaucoma and other disorders of the eye. Is the patient up to date with their annual eye exam?  Yes  Who is the  provider or what is the name of the office in which the patient attends annual eye exams? [facility]   Dental Screening: Recommended annual dental exams for proper oral hygiene  Community Resource Referral / Chronic Care Management: CRR required this visit?  No   CCM required this visit?  No      Plan:     I have personally reviewed and noted the following in the patient's chart:   . Medical and social history . Use of alcohol, tobacco or illicit drugs  . Current medications and supplements . Functional ability and status . Nutritional status . Physical activity . Advanced directives . List of other physicians . Hospitalizations, surgeries, and ER visits in previous 12 months . Vitals . Screenings to include cognitive, depression, and falls . Referrals and appointments  In addition, I have reviewed and discussed with patient certain preventive protocols, quality metrics, and best practice recommendations. A written personalized care plan for preventive services as well as general preventive health recommendations were provided to patient.     Willette Brace, LPN   04/25/453   Nurse Notes: None

## 2020-03-28 NOTE — Patient Instructions (Addendum)
Dale Gonzalez , Thank you for taking time to come for your Medicare Wellness Visit. I appreciate your ongoing commitment to your health goals. Please review the following plan we discussed and let me know if I can assist you in the future.   Screening recommendations/referrals: Colonoscopy: Done 12/11/11 Recommended yearly ophthalmology/optometry visit for glaucoma screening and checkup Recommended yearly dental visit for hygiene and checkup  Vaccinations: Influenza vaccine: Up to date Pneumococcal vaccine: Due at next visit  Tdap vaccine: Up to date Shingles vaccine: Completed 07/15/17 & 10/17/17   Covid-19: Completed 1/16 & 09/23/19  Advanced directives: Please bring a copy of your health care power of attorney and living will to the office at your convenience.   Conditions/risks identified: Exercise and Activity  Next appointment: Follow up in one year for your annual wellness visit.   Preventive Care 64 Years and Older, Male Preventive care refers to lifestyle choices and visits with your health care provider that can promote health and wellness. What does preventive care include?  A yearly physical exam. This is also called an annual well check.  Dental exams once or twice a year.  Routine eye exams. Ask your health care provider how often you should have your eyes checked.  Personal lifestyle choices, including:  Daily care of your teeth and gums.  Regular physical activity.  Eating a healthy diet.  Avoiding tobacco and drug use.  Limiting alcohol use.  Practicing safe sex.  Taking low doses of aspirin every day.  Taking vitamin and mineral supplements as recommended by your health care provider. What happens during an annual well check? The services and screenings done by your health care provider during your annual well check will depend on your age, overall health, lifestyle risk factors, and family history of disease. Counseling  Your health care provider may  ask you questions about your:  Alcohol use.  Tobacco use.  Drug use.  Emotional well-being.  Home and relationship well-being.  Sexual activity.  Eating habits.  History of falls.  Memory and ability to understand (cognition).  Work and work Statistician. Screening  You may have the following tests or measurements:  Height, weight, and BMI.  Blood pressure.  Lipid and cholesterol levels. These may be checked every 5 years, or more frequently if you are over 29 years old.  Skin check.  Lung cancer screening. You may have this screening every year starting at age 87 if you have a 30-pack-year history of smoking and currently smoke or have quit within the past 15 years.  Fecal occult blood test (FOBT) of the stool. You may have this test every year starting at age 41.  Flexible sigmoidoscopy or colonoscopy. You may have a sigmoidoscopy every 5 years or a colonoscopy every 10 years starting at age 48.  Prostate cancer screening. Recommendations will vary depending on your family history and other risks.  Hepatitis C blood test.  Hepatitis B blood test.  Sexually transmitted disease (STD) testing.  Diabetes screening. This is done by checking your blood sugar (glucose) after you have not eaten for a while (fasting). You may have this done every 1-3 years.  Abdominal aortic aneurysm (AAA) screening. You may need this if you are a current or former smoker.  Osteoporosis. You may be screened starting at age 76 if you are at high risk. Talk with your health care provider about your test results, treatment options, and if necessary, the need for more tests. Vaccines  Your health care provider may  recommend certain vaccines, such as:  Influenza vaccine. This is recommended every year.  Tetanus, diphtheria, and acellular pertussis (Tdap, Td) vaccine. You may need a Td booster every 10 years.  Zoster vaccine. You may need this after age 83.  Pneumococcal 13-valent  conjugate (PCV13) vaccine. One dose is recommended after age 24.  Pneumococcal polysaccharide (PPSV23) vaccine. One dose is recommended after age 65. Talk to your health care provider about which screenings and vaccines you need and how often you need them. This information is not intended to replace advice given to you by your health care provider. Make sure you discuss any questions you have with your health care provider. Document Released: 09/02/2015 Document Revised: 04/25/2016 Document Reviewed: 06/07/2015 Elsevier Interactive Patient Education  2017 [place] Prevention in the Home Falls can cause injuries. They can happen to people of all ages. There are many things you can do to make your home safe and to help prevent falls. What can I do on the outside of my home?  Regularly fix the edges of walkways and driveways and fix any cracks.  Remove anything that might make you trip as you walk through a door, such as a raised step or threshold.  Trim any bushes or trees on the path to your home.  Use bright outdoor lighting.  Clear any walking paths of anything that might make someone trip, such as rocks or tools.  Regularly check to see if handrails are loose or broken. Make sure that both sides of any steps have handrails.  Any raised decks and porches should have guardrails on the edges.  Have any leaves, snow, or ice cleared regularly.  Use sand or salt on walking paths during winter.  Clean up any spills in your garage right away. This includes oil or grease spills. What can I do in the bathroom?  Use night lights.  Install grab bars by the toilet and in the tub and shower. Do not use towel bars as grab bars.  Use non-skid mats or decals in the tub or shower.  If you need to sit down in the shower, use a plastic, non-slip stool.  Keep the floor dry. Clean up any water that spills on the floor as soon as it happens.  Remove soap buildup in the tub or  shower regularly.  Attach bath mats securely with double-sided non-slip rug tape.  Do not have throw rugs and other things on the floor that can make you trip. What can I do in the bedroom?  Use night lights.  Make sure that you have a light by your bed that is easy to reach.  Do not use any sheets or blankets that are too big for your bed. They should not hang down onto the floor.  Have a firm chair that has side arms. You can use this for support while you get dressed.  Do not have throw rugs and other things on the floor that can make you trip. What can I do in the kitchen?  Clean up any spills right away.  Avoid walking on wet floors.  Keep items that you use a lot in easy-to-reach places.  If you need to reach something above you, use a strong step stool that has a grab bar.  Keep electrical cords out of the way.  Do not use floor polish or wax that makes floors slippery. If you must use wax, use non-skid floor wax.  Do not have throw rugs and  other things on the floor that can make you trip. What can I do with my stairs?  Do not leave any items on the stairs.  Make sure that there are handrails on both sides of the stairs and use them. Fix handrails that are broken or loose. Make sure that handrails are as long as the stairways.  Check any carpeting to make sure that it is firmly attached to the stairs. Fix any carpet that is loose or worn.  Avoid having throw rugs at the top or bottom of the stairs. If you do have throw rugs, attach them to the floor with carpet tape.  Make sure that you have a light switch at the top of the stairs and the bottom of the stairs. If you do not have them, ask someone to add them for you. What else can I do to help prevent falls?  Wear shoes that:  Do not have high heels.  Have rubber bottoms.  Are comfortable and fit you well.  Are closed at the toe. Do not wear sandals.  If you use a stepladder:  Make sure that it is fully  opened. Do not climb a closed stepladder.  Make sure that both sides of the stepladder are locked into place.  Ask someone to hold it for you, if possible.  Clearly mark and make sure that you can see:  Any grab bars or handrails.  First and last steps.  Where the edge of each step is.  Use tools that help you move around (mobility aids) if they are needed. These include:  Canes.  Walkers.  Scooters.  Crutches.  Turn on the lights when you go into a dark area. Replace any light bulbs as soon as they burn out.  Set up your furniture so you have a clear path. Avoid moving your furniture around.  If any of your floors are uneven, fix them.  If there are any pets around you, be aware of where they are.  Review your medicines with your doctor. Some medicines can make you feel dizzy. This can increase your chance of falling. Ask your doctor what other things that you can do to help prevent falls. This information is not intended to replace advice given to you by your health care provider. Make sure you discuss any questions you have with your health care provider. Document Released: 06/02/2009 Document Revised: 01/12/2016 Document Reviewed: 09/10/2014 Elsevier Interactive Patient Education  2017 [employer].

## 2020-04-08 ENCOUNTER — Telehealth: Payer: Self-pay | Admitting: Family Medicine

## 2020-04-08 NOTE — Progress Notes (Signed)
°  Chronic Care Management   Note  04/08/2020 Name: Dale Gonzalez MRN: [id number] DOB: [birth year]  Dale Gonzalez is a 69 y.o. year old male who is a primary care patient of Marin Olp, MD. I reached out to Dale Gonzalez by phone today in response to a referral sent by Mr. Reginia Forts Sefcik's PCP, Marin Olp, MD.   Mr. Barnhart was given information about Chronic Care Management services today including:  1. CCM service includes personalized support from designated clinical staff supervised by his physician, including individualized plan of care and coordination with other care providers 2. 24/7 contact phone numbers for assistance for urgent and routine care needs. 3. Service will only be billed when office clinical staff spend 20 minutes or more in a month to coordinate care. 4. Only one practitioner may furnish and bill the service in a calendar month. 5. The patient may stop CCM services at any time (effective at the end of the month) by phone call to the office staff.   Patient agreed to services and verbal consent obtained.   Follow up plan:   Earney [place] Upstream Scheduler

## 2020-04-18 NOTE — Progress Notes (Signed)
Phone: [phone]   Subjective:  Patient presents today for their annual physical. Chief complaint-noted.   See problem oriented charting- Review of Systems  Constitutional: Negative for chills and fever.  HENT: Negative for hearing loss and tinnitus.   Eyes: Negative for blurred vision and double vision.  Respiratory: Negative for cough and shortness of breath.   Cardiovascular: Negative for chest pain and palpitations.  Gastrointestinal: Negative for abdominal pain, constipation, diarrhea, nausea and vomiting.  Genitourinary: Negative for dysuria and frequency.  Musculoskeletal: Negative for back pain, myalgias and neck pain.  Skin: Negative for itching and rash.  Neurological: Negative for dizziness and headaches.  Endo/Heme/Allergies: Negative for polydipsia. Bruises/bleeds easily (some with brilinta).  Psychiatric/Behavioral: Negative for depression, substance abuse and suicidal ideas.   The following were reviewed and entered/updated in epic: Past Medical History:  Diagnosis Date  . Basal cell cancer    right cheek; ?left arm; Dr Wilhemina Bonito  . Chicken pox   . Coronary artery disease   . Hyperlipidemia    LDL goal = <115  . Myocardial infarction ([place]) 07/27/2017  . RAD (reactive airway disease) 1998   cold induced cough @ [facility]  . Seasonal allergies    Patient Active Problem List   Diagnosis Date Noted  . Coronary artery disease s/p PTCA x 5 in 07/2017 10/10/2018    Priority: High  . Elevated blood pressure reading without diagnosis of hypertension 05/18/2014    Priority: Medium  . Nonspecific abnormal results of thyroid function study 01/12/2014    Priority: Medium  . Dyslipidemia 12/18/2006    Priority: Medium  . Exercise-induced asthma primarily cold induced 07/24/2013    Priority: Low  . Skin cancer, basal cell 12/27/2011    Priority: Low  . Microscopic hematuria 12/27/2011    Priority: Low  . Allergic rhinitis 12/19/2007    Priority: Low  . GERD  (gastroesophageal reflux disease) 08/04/2019   Past Surgical History:  Procedure Laterality Date  . COLONOSCOPY  2003 & 2013   negative; Dr Sharlett Iles  . CORONARY ANGIOPLASTY WITH STENT PLACEMENT  07/27/2017; 08/08/2017  . CORONARY STENT INTERVENTION N/A 08/08/2017   Procedure: CORONARY STENT INTERVENTION;  Surgeon: Nigel Mormon, MD;  Location: [facility];  Service: Cardiovascular;  Laterality: N/A;  . EYE SURGERY     right eye lid  dropping procedure 9/21  . eyelid surgery     bilateral- did not work well for right eyelid   . INTRAVASCULAR ULTRASOUND/IVUS N/A 08/08/2017   Procedure: Intravascular Ultrasound/IVUS;  Surgeon: Nigel Mormon, MD;  Location: [facility];  Service: Cardiovascular;  Laterality: N/A;  . LEFT HEART CATH AND CORONARY ANGIOGRAPHY N/A 08/08/2017   Procedure: LEFT HEART CATH AND CORONARY ANGIOGRAPHY;  Surgeon: Nigel Mormon, MD;  Location: [facility];  Service: Cardiovascular;  Laterality: N/A;  . MOHS SURGERY     "right face; ?left arm"  . ROOT CANAL  01/04/2020   Bilateral  . TONSILLECTOMY    . WRIST GANGLION EXCISION Left    Dr Wonda Olds    Family History  Problem Relation Age of Onset  . Cancer Mother         cervical lymph nodes. mother has not shared primary cancer with him.   Marland Kitchen Heart disease Mother   . Hypertension Mother   . Hypertension Father   . Heart disease Father        bypass surgery  . Heart attack Father 69  Colostomy for ? diagnosis  . COPD Father   . Cancer Father        was not shared with patient primary cancer.   . Hyperlipidemia Brother   . Hypertension Brother   . Hyperlipidemia Brother   . Hypertension Brother   . 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   . Colon cancer Neg Hx   . Esophageal cancer Neg Hx   . Stomach cancer Neg Hx   . Rectal cancer Neg Hx   . Stroke Neg Hx   . Diabetes Neg Hx     Medications- reviewed and  updated Current Outpatient Medications  Medication Sig Dispense Refill  . atorvastatin (LIPITOR) 80 MG tablet TAKE 1 TABLET BY MOUTH DAILY 90 tablet 2  . BRILINTA 60 MG TABS tablet TAKE 1 (ONE) TABLET BY MOUTH TWICE DAILY 180 tablet 1  . EPINEPHrine (EPIPEN) 0.3 mg/0.3 mL SOAJ injection Inject 0.3 mLs (0.3 mg total) into the muscle once. (Patient taking differently: Inject 0.3 mg into the muscle once as needed (for anaphylactic reaction). ) 1 Device 5  . losartan (COZAAR) 25 MG tablet TAKE ONE (1) TABLET BY MOUTH EVERY DAY 90 tablet 3  . metoprolol succinate (TOPROL-XL) 25 MG 24 hr tablet TAKE 1 TABLET BY MOUTH ONCE DAILY 90 tablet 1  . nitroGLYCERIN (NITROSTAT) 0.4 MG SL tablet PLACE ONE TABLET UNDER THE TONGUE EVERY 5 MINUTES AS NEEDED FOR CHEST PAIN 25 tablet 3  . SM ASPIRIN ADULT LOW STRENGTH 81 MG EC tablet TAKE ONE (1) TABLET BY MOUTH EVERY DAY 90 tablet 1   No current facility-administered medications for this visit.    Allergies-reviewed and updated Allergies  Allergen Reactions  . Bee Venom Anaphylaxis and Swelling    Swelling of the throat    Social History   Social History Narrative   Married.       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    Works part time for his prior business- may stop in 2021- about 10 hours a week      Hobbies: enjoys spending time with his grandkids, regular exercise. Active in his church- [employer].    Objective  Objective:  BP 126/80   Pulse (!) 59   Temp 98.6 F (37 C) (Temporal)   Ht 5\' 7"  (1.702 m)   Wt 168 lb (76.2 kg)   SpO2 96%   BMI 26.31 kg/m  Gen: NAD, resting comfortably HEENT: Mucous membranes are moist. Oropharynx normal Neck: no thyromegaly CV: RRR no murmurs rubs or gallops Lungs: CTAB no crackles, wheeze, rhonchi Abdomen: soft/nontender/nondistended/normal bowel sounds. No rebound or guarding.  Ext: no edema Skin: warm, dry Neuro: grossly normal, moves all extremities,  PERRLA    Assessment and Plan  69 y.o. male presenting for annual physical.  Health Maintenance counseling: 1. Anticipatory guidance: Patient counseled regarding regular dental exams -q6 months, eye exams -yearly,  avoiding smoking and second hand smoke , limiting alcohol to 2 beverages per day .   2. Risk factor reduction:  Advised patient of need for regular exercise and diet rich and fruits and vegetables to reduce risk of heart attack and stroke. Exercise- 6-7 days a week. Diet-  reasonably healthy diet.  Wt Readings from Last 3 Encounters:  04/19/20 168 lb (76.2 kg)  01/05/20 167 lb (75.8 kg)  01/04/20 167 lb (75.8 kg)  3. Immunizations/screenings/ancillary studies-discussed Pneumovax 23 today  -  given, as well as flu shot-unfortunately we do not have high dose available he will wait on high-dose  Immunization History  Administered Date(s) Administered  . Influenza Whole 07/08/2007, 05/20/2008, 08/04/2008, 06/01/2010  . Influenza,inj,Quad PF,6+ Mos 05/18/2014  . Influenza-Unspecified 07/01/2017, 04/08/2019  . PFIZER SARS-COV-2 Vaccination 09/05/2019, 09/23/2019  . Pneumococcal Conjugate-13 08/02/2015, 03/27/2016  . Td 12/24/2008  . Tdap 02/18/2014  . Zoster 01/04/2011  . Zoster Recombinat (Shingrix) 07/15/2017, 10/17/2017  4. Prostate cancer screening- we discussed checking PSA today- he agrees. .Since 2010 his PSA has ranged from a low of 0.4 to  a high of 1.32. In May of 2012 it was 0.78." He had a PSA with [place] recently that was 0.40 in august 2020. Once a week with nocturia Lab Results  Component Value Date   PSA 0.53 01/24/2015   PSA 0.78 12/20/2010   PSA 1.32 12/19/2009   5. Colon cancer screening - 12/11/2011 with 10-year follow-up planned 6. Skin cancer screening-Dr. [place] dermatology. advised regular sunscreen use. Denies worrisome, changing, or new skin lesions.  7.  Never smoker 8. STD screening - not needed as monogamous  Status of chronic or  acute concerns   #hypertension S: medication: Metoprolol 25Mg , Losartan 25Mg  BP Readings from Last 3 Encounters:  04/19/20 126/80  01/05/20 140/88  01/04/20 122/70  A/P: Stable. Continue current medications.   #Coronary artery disease-status post PTCAx5 total (x3  December 2018)-last cardiology visit January 2021 with plan to continue Brilinta through December 2021-possibly stopping metoprolol as well #hyperlipidemia S: Medication: Atorvastatin 80Mg , Brilinta, aspirin.  Patient also on metoprolol and losartan per cardiology without diagnosis of hypertension though prior PCP had listed hypertension Lab Results  Component Value Date   CHOL 180 01/24/2015   HDL 34 (L) 01/24/2015   LDLCALC 112 (H) 01/24/2015   TRIG 168 (H) 01/24/2015   CHOLHDL 6 12/29/2013   A/P: CAD asymptomatic-continue cardiology follow-up.  Update lipid panel with LDL goal 70 or less  Hopefully cholesterol at goal- update lipid panel  #Elevated TSH in the past-we will trend with labs today Lab Results  Component Value Date   TSH 4.78 (H) 01/24/2015   #History microscopic hematuria-has seen Dr. Amalia Hailey of urology in the past.  From Dr. Linna Darner notes "Urinalysis  reveals trace hematuria. In 2012 it revealed small to moderate microscopic hematuria. This was evaluated in the past  by Dr. Amalia Hailey, Urologist. It is most likely due to the number of hours he bikes which is 5-6 hours per week on average." -We will update urinalysis with labs today.  Microscopic if needed  #Exercise-induced asthma primarily cold-induced.  No issues recently in years.  Mask in cold weather is helpful  Recommended follow up: Return in about 1 year (around 04/19/2021) for physical or sooner if needed. Future Appointments  Date Time Provider [place]  06/29/2020  9:00 AM [facility] CCM PHARMACIST [facility]  04/03/2021  8:00 AM [facility] HEALTH COACH [facility]   Lab/Order associations: not fasting- 4 oz protein smoothie this am    ICD-10-CM   1. Preventative health care  A41.66 COMPLETE METABOLIC PANEL WITH GFR    CBC    Lipid panel  2. Dyslipidemia  A63.0 COMPLETE METABOLIC PANEL WITH GFR    CBC    Lipid panel  3. Nocturia  R35.1    Return precautions advised.  Garret Reddish, MD

## 2020-04-18 NOTE — Patient Instructions (Addendum)
Please stop by lab before you go If you have mychart- we will send your results within 3 business days of [place] receiving them.  If you do not have mychart- we will call you about results within 5 business days of [place] receiving them.  *please note we are currently using [facility] which has a longer processing time than [place] typically so labs may not come back as quickly as in the past *please also note that you will see labs on mychart as soon as they post. I will later go in and write notes on them- will say "notes from Dr. Yong Channel"  Health Maintenance Due  Topic Date Due  . PNA vac Low Risk Adult (2 of 2 - PPSV23)- today 03/27/2017  . INFLUENZA VACCINE - - High dose flu shot when available - will complete later in flu season (please let us know if you get this at another location so we can update your chart) . We should have vaccination here in 1-2 months - can call back for an appointment.   03/20/2020    Team ordered urine today

## 2020-04-19 ENCOUNTER — Ambulatory Visit (INDEPENDENT_AMBULATORY_CARE_PROVIDER_SITE_OTHER): Payer: PPO | Admitting: Family Medicine

## 2020-04-19 ENCOUNTER — Other Ambulatory Visit: Payer: Self-pay

## 2020-04-19 ENCOUNTER — Encounter: Payer: Self-pay | Admitting: Family Medicine

## 2020-04-19 VITALS — BP 126/80 | HR 59 | Temp 98.6°F | Ht 67.0 in | Wt 168.0 lb

## 2020-04-19 DIAGNOSIS — Z0000 Encounter for general adult medical examination without abnormal findings: Secondary | ICD-10-CM | POA: Diagnosis not present

## 2020-04-19 DIAGNOSIS — R351 Nocturia: Secondary | ICD-10-CM | POA: Diagnosis not present

## 2020-04-19 DIAGNOSIS — E785 Hyperlipidemia, unspecified: Secondary | ICD-10-CM

## 2020-04-19 DIAGNOSIS — Z23 Encounter for immunization: Secondary | ICD-10-CM | POA: Diagnosis not present

## 2020-04-19 NOTE — Addendum Note (Signed)
Addended by: Genevie Cheshire L on: 04/19/2020 12:56 PM   Modules accepted: Orders

## 2020-04-26 ENCOUNTER — Other Ambulatory Visit: Payer: PPO

## 2020-04-28 ENCOUNTER — Other Ambulatory Visit: Payer: Self-pay

## 2020-04-28 ENCOUNTER — Other Ambulatory Visit (INDEPENDENT_AMBULATORY_CARE_PROVIDER_SITE_OTHER): Payer: PPO

## 2020-04-28 DIAGNOSIS — Z0000 Encounter for general adult medical examination without abnormal findings: Secondary | ICD-10-CM | POA: Diagnosis not present

## 2020-04-28 DIAGNOSIS — E785 Hyperlipidemia, unspecified: Secondary | ICD-10-CM | POA: Diagnosis not present

## 2020-04-28 DIAGNOSIS — R351 Nocturia: Secondary | ICD-10-CM

## 2020-04-28 LAB — POC URINALSYSI DIPSTICK (AUTOMATED)
Bilirubin, UA: NEGATIVE
Glucose, UA: NEGATIVE
Ketones, UA: NEGATIVE
Leukocytes, UA: NEGATIVE
Nitrite, UA: NEGATIVE
Protein, UA: NEGATIVE
Spec Grav, UA: >=1.03 — AB (ref 1.010–1.025)
Urobilinogen, UA: 0.2 E.U./dL
pH, UA: 6 (ref 5.0–8.0)

## 2020-04-28 LAB — COMPLETE METABOLIC PANEL WITH GFR
AG Ratio: 1.9 (calc) (ref 1.0–2.5)
ALT: 19 U/L (ref 9–46)
AST: 23 U/L (ref 10–35)
Albumin: 4.2 g/dL (ref 3.6–5.1)
Alkaline phosphatase (APISO): 47 U/L (ref 35–144)
BUN: 18 mg/dL (ref 7–25)
CO2: 25 mmol/L (ref 20–32)
Calcium: 9 mg/dL (ref 8.6–10.3)
Chloride: 108 mmol/L (ref 98–110)
Creat: 0.91 mg/dL (ref 0.70–1.25)
GFR, Est African American: 99 mL/min/{1.73_m2} (ref >=60)
GFR, Est Non African American: 86 mL/min/{1.73_m2} (ref >=60)
Globulin: 2.2 g/dL (calc) (ref 1.9–3.7)
Glucose, Bld: 92 mg/dL (ref 65–99)
Potassium: 4.1 mmol/L (ref 3.5–5.3)
Sodium: 139 mmol/L (ref 135–146)
Total Bilirubin: 0.7 mg/dL (ref 0.2–1.2)
Total Protein: 6.4 g/dL (ref 6.1–8.1)

## 2020-04-28 LAB — LIPID PANEL
Cholesterol: 97 mg/dL (ref <200)
HDL: 30 mg/dL — ABNORMAL LOW (ref >=40)
LDL Cholesterol (Calc): 49 mg/dL (calc)
Non-HDL Cholesterol (Calc): 67 mg/dL (calc) (ref <130)
Total CHOL/HDL Ratio: 3.2 (calc) (ref <5.0)
Triglycerides: 99 mg/dL (ref <150)

## 2020-04-28 LAB — CBC
HCT: 43.7 % (ref 38.5–50.0)
Hemoglobin: 14.7 g/dL (ref 13.2–17.1)
MCH: 30.6 pg (ref 27.0–33.0)
MCHC: 33.6 g/dL (ref 32.0–36.0)
MCV: 91 fL (ref 80.0–100.0)
MPV: 10.2 fL (ref 7.5–12.5)
Platelets: 191 10*3/uL (ref 140–400)
RBC: 4.8 10*6/uL (ref 4.20–5.80)
RDW: 13.1 % (ref 11.0–15.0)
WBC: 5.4 10*3/uL (ref 3.8–10.8)

## 2020-04-28 LAB — TSH: TSH: 5.04 mIU/L — ABNORMAL HIGH (ref 0.40–4.50)

## 2020-04-28 LAB — PSA: PSA: 0.6 ng/mL (ref <=4.0)

## 2020-04-28 NOTE — Addendum Note (Signed)
Addended by: Liliane Channel on: 04/28/2020 08:27 AM   Modules accepted: Orders

## 2020-05-02 ENCOUNTER — Other Ambulatory Visit: Payer: Self-pay

## 2020-05-02 DIAGNOSIS — R319 Hematuria, unspecified: Secondary | ICD-10-CM

## 2020-05-05 ENCOUNTER — Other Ambulatory Visit: Payer: Self-pay

## 2020-05-05 ENCOUNTER — Other Ambulatory Visit: Payer: PPO

## 2020-05-05 DIAGNOSIS — R319 Hematuria, unspecified: Secondary | ICD-10-CM | POA: Diagnosis not present

## 2020-05-05 NOTE — Addendum Note (Signed)
Addended by: Milton Ferguson D on: 05/05/2020 08:35 AM   Modules accepted: Orders

## 2020-05-06 LAB — URINALYSIS, MICROSCOPIC ONLY
Bacteria, UA: NONE SEEN /HPF
Hyaline Cast: NONE SEEN /LPF
RBC / HPF: NONE SEEN /HPF (ref 0–2)
Squamous Epithelial / HPF: NONE SEEN /HPF (ref <=5)
WBC, UA: NONE SEEN /HPF (ref 0–5)

## 2020-05-09 ENCOUNTER — Other Ambulatory Visit (HOSPITAL_COMMUNITY): Payer: Self-pay | Admitting: Internal Medicine

## 2020-05-17 ENCOUNTER — Ambulatory Visit (INDEPENDENT_AMBULATORY_CARE_PROVIDER_SITE_OTHER): Payer: PPO

## 2020-05-17 ENCOUNTER — Other Ambulatory Visit: Payer: Self-pay

## 2020-05-17 DIAGNOSIS — Z23 Encounter for immunization: Secondary | ICD-10-CM | POA: Diagnosis not present

## 2020-05-23 DIAGNOSIS — D485 Neoplasm of uncertain behavior of skin: Secondary | ICD-10-CM | POA: Diagnosis not present

## 2020-05-23 DIAGNOSIS — L570 Actinic keratosis: Secondary | ICD-10-CM | POA: Diagnosis not present

## 2020-05-23 DIAGNOSIS — Z85828 Personal history of other malignant neoplasm of skin: Secondary | ICD-10-CM | POA: Diagnosis not present

## 2020-05-24 DIAGNOSIS — Z09 Encounter for follow-up examination after completed treatment for conditions other than malignant neoplasm: Secondary | ICD-10-CM | POA: Diagnosis not present

## 2020-05-24 DIAGNOSIS — H02421 Myogenic ptosis of right eyelid: Secondary | ICD-10-CM | POA: Diagnosis not present

## 2020-06-22 ENCOUNTER — Telehealth: Payer: Self-pay | Admitting: Family Medicine

## 2020-06-22 NOTE — Chronic Care Management (AMB) (Signed)
°  Chronic Care Management   Outreach Note  06/22/2020 Name: Dale Gonzalez MRN: [id number] DOB: [birth year]  Referred by: Marin Olp, MD Reason for referral : No chief complaint on file.   An unsuccessful telephone outreach was attempted today. The patient was referred to the pharmacist for assistance with care management and care coordination.   Follow Up Plan:   Lauretta Grill Upstream Scheduler

## 2020-06-29 ENCOUNTER — Telehealth: Payer: PPO

## 2020-07-08 ENCOUNTER — Other Ambulatory Visit (HOSPITAL_COMMUNITY): Payer: Self-pay | Admitting: Oral & Maxillofacial Surgery

## 2020-08-05 ENCOUNTER — Telehealth: Payer: Self-pay

## 2020-08-05 NOTE — Progress Notes (Signed)
Patient called and stated he would like to cancel appointment on 08-29-2020. Patient stated he would make at a later time .  Georgiana Shore ,[facility] Pharmacist Assistant [phone]

## 2020-08-09 ENCOUNTER — Telehealth: Payer: PPO

## 2020-08-22 ENCOUNTER — Other Ambulatory Visit: Payer: Self-pay | Admitting: Cardiology

## 2020-08-25 ENCOUNTER — Ambulatory Visit: Payer: Medicare Other | Admitting: Cardiology

## 2020-08-25 ENCOUNTER — Other Ambulatory Visit: Payer: Self-pay

## 2020-08-25 ENCOUNTER — Other Ambulatory Visit: Payer: Self-pay | Admitting: Cardiology

## 2020-08-25 ENCOUNTER — Encounter: Payer: Self-pay | Admitting: Cardiology

## 2020-08-25 VITALS — BP 123/76 | HR 57 | Resp 16 | Ht 67.0 in | Wt 167.0 lb

## 2020-08-25 DIAGNOSIS — I2510 Atherosclerotic heart disease of native coronary artery without angina pectoris: Secondary | ICD-10-CM

## 2020-08-25 DIAGNOSIS — E785 Hyperlipidemia, unspecified: Secondary | ICD-10-CM | POA: Diagnosis not present

## 2020-08-25 MED ORDER — ATORVASTATIN CALCIUM 80 MG PO TABS: 80.0000 mg | ORAL_TABLET | Freq: Every day | ORAL | 3 refills | Status: DC

## 2020-08-25 MED ORDER — METOPROLOL SUCCINATE ER 25 MG PO TB24: 25.0000 mg | ORAL_TABLET | Freq: Every day | ORAL | 3 refills | Status: DC

## 2020-08-25 MED ORDER — NITROGLYCERIN 0.4 MG SL SUBL: 0.4000 mg | SUBLINGUAL_TABLET | SUBLINGUAL | 3 refills | Status: DC | PRN

## 2020-08-25 MED ORDER — LOSARTAN POTASSIUM 25 MG PO TABS: 25.0000 mg | ORAL_TABLET | Freq: Every day | ORAL | 3 refills | Status: DC

## 2020-08-25 MED ORDER — ASPIRIN 81 MG PO TBEC: 81.0000 mg | DELAYED_RELEASE_TABLET | Freq: Every day | ORAL | 3 refills | Status: DC

## 2020-08-25 NOTE — Progress Notes (Signed)
Patient is here for follow up visit.  Subjective:   _0  ID: Dale Gonzalez, male    DOB: 1951/04/21, 70 y.o.   MRN: [id number]   Chief Complaint  Patient presents with  . Coronary artery disease involving native coronary artery of  . Follow-up    HPI  70 year old Caucasian with coronary artery disease (STEMI and staged multivessel PCI 07/2017).  Patient exercises regularly, including spinning classes, 5 days a week.  He does not have any chest pain or shortness of breath with exertion.  He has noticed that his heart rate does not get above 130s during his blood loss.  He wonders if this is a problem, and if it is related to his medications.  He has also noticed that his VO2 max has gone from 40s to high 30s, without any rapid decline. He asks if he could stop Brilinta.   Current Outpatient Medications on File Prior to Visit  Medication Sig Dispense Refill  . atorvastatin (LIPITOR) 80 MG tablet TAKE 1 TABLET BY MOUTH DAILY 90 tablet 2  . BRILINTA 60 MG TABS tablet TAKE 1 (ONE) TABLET BY MOUTH TWICE DAILY 60 tablet 1  . EPINEPHrine (EPIPEN) 0.3 mg/0.3 mL SOAJ injection Inject 0.3 mLs (0.3 mg total) into the muscle once. (Patient taking differently: Inject 0.3 mg into the muscle once as needed (for anaphylactic reaction).) 1 Device 5  . losartan (COZAAR) 25 MG tablet TAKE ONE (1) TABLET BY MOUTH EVERY DAY 90 tablet 3  . metoprolol succinate (TOPROL-XL) 25 MG 24 hr tablet TAKE 1 TABLET BY MOUTH ONCE DAILY 90 tablet 1  . nitroGLYCERIN (NITROSTAT) 0.4 MG SL tablet PLACE ONE TABLET UNDER THE TONGUE EVERY 5 MINUTES AS NEEDED FOR CHEST PAIN 25 tablet 3  . SM ASPIRIN ADULT LOW STRENGTH 81 MG EC tablet TAKE ONE (1) TABLET BY MOUTH EVERY DAY 90 tablet 1   No current facility-administered medications on file prior to visit.    Cardiovascular studies:   EKG 08/25/2020: Sinus rhythm 56 bpm Left anterior fascicular block Poor R wave progression  Exercise sestamibi stress test  10/03/2018:  1. The patient performed treadmill exercise using Bruce protocol, completing 10:19 minutes. The patient completed an estimated workload of 12.3 METS, reaching 87% of the maximum predicted heart rate. Exercise capacity was excellent. Hemodynamic response was normal. Stress symptoms included fatigue. Stress electrocardiogram demonstrated normal sinus rhythm, normal resting conduction, old inferior infarct, no resting arrhythmias, and normal rest repolarization. No ischemic changes seen on stress electrocardiogram.  2. The overall quality of the study is good. Left ventricular cavity is noted to be normal on the rest and stress studies. Gated SPECT imaging demonstrates hypokinesis of the basal inferolateral and mid inferolateral myocardial wall(s). The left ventricular ejection fraction was calculated or visually estimated to be 47%. SPECT images show medium sized, mild intensity perfusion defect with minimal reversibility. Findings suggest old infarct in LCx territory with no significant ischemia.  3. Intermediate risk study.  Coronary intervention 08/08/2017:   Complex LAD/DIag bifurcation PCI Diag 2.5 X 16 mm Synergy DES LAD 2.75 X 38 mm & 3.5 X 20 mm Synergy DES Known residual moderate disease in small caliber RCA OM1 overlapping stents Synergy 2.25 x 38 mm and 2.25 x 12 mm (06/2017 in [facility] echocardiogram 08/09/2017:  - Left ventricle: The cavity size was normal. The estimated   ejection fraction was in the range of 45% to 50%. Mild   inferolateral hypokinesis. - Mitral valve: There was  mild regurgitation. - Normal right atrial pressure.  Recent labs: 04/28/2020: Glucose 92, BUN/Cr 18/0.91. EGFR 86. Na/K 139/4.1. Rest of the CMP normal H/H 14/43. MCV 91. Platelets 191 HbA1C 5.0% Chol 97, TG 99, HDL 30, LDL 49   03/2019: Glucose 87, BUN/Cr 19/0.9. EGFR 83. Na/K 138/4.2.  Chol 91, TG 98, HDL 31, LDL 40 Apolipoprotein B 45   Review of Systems   Cardiovascular: Negative for chest pain, dyspnea on exertion, leg swelling, palpitations and syncope.        Objective:    Vitals:   08/25/20 1203  BP: 123/76  Pulse: (!) 57  Resp: 16  SpO2: 99%    Physical Exam Vitals and nursing note reviewed.  Constitutional:      Appearance: He is well-developed.  Neck:     Vascular: No JVD.  Cardiovascular:     Rate and Rhythm: Normal rate and regular rhythm.     Pulses: Intact distal pulses.     Heart sounds: Normal heart sounds. No murmur heard.   Pulmonary:     Effort: Pulmonary effort is normal.     Breath sounds: Normal breath sounds. No wheezing or rales.         Assessment & Recommendations:   70 year-old Caucasian with coronary artery disease (STEMI and staged multivessel PCI 07/2017).  CAD: No angina symptoms with excellent baseline functional capacity. I believe his HR limitation during exercise is due to metoprolol. He doe snot have any angina symptoms. He does not have any side effects from metoprolol otherwise, so will continue the same.  [id number] has had mild gradual decline without any sudden change. I believe this is expected age related changw. I do not suspect any pathological change.  Stop Brilinta. Continue Aspirin 81 mg daily. Continue losartan 25 mg daily.  Continue lipitor 80 mg daily. While his HDL chronically remains around 30, LDL is very well controlled at 40.   F/u in 1 year  MNigel Mormon MD PMethodist [facility]. [facility] Pager: [phone]: [phone] no answer Cell [phone]

## 2020-08-29 ENCOUNTER — Telehealth: Payer: PPO

## 2020-10-26 ENCOUNTER — Ambulatory Visit (INDEPENDENT_AMBULATORY_CARE_PROVIDER_SITE_OTHER): Payer: Medicare Other | Admitting: Podiatry

## 2020-10-26 ENCOUNTER — Encounter: Payer: Self-pay | Admitting: Podiatry

## 2020-10-26 ENCOUNTER — Ambulatory Visit (INDEPENDENT_AMBULATORY_CARE_PROVIDER_SITE_OTHER): Payer: Medicare Other

## 2020-10-26 ENCOUNTER — Other Ambulatory Visit: Payer: Self-pay

## 2020-10-26 DIAGNOSIS — S90211A Contusion of right great toe with damage to nail, initial encounter: Secondary | ICD-10-CM | POA: Diagnosis not present

## 2020-10-26 DIAGNOSIS — S99921A Unspecified injury of right foot, initial encounter: Secondary | ICD-10-CM

## 2020-10-26 NOTE — Progress Notes (Signed)
Subjective:   Patient ID: Dale Gonzalez, male   DOB: 70 y.o.   MRN: [id number]   HPI Patient states he dropped a wheelbarrow on his right big toe and is concerned about fracture and the nail condition and whether or not it needs to be removed   ROS      Objective:  Physical Exam  Neurovascular status intact with discolored right hallux nail bed with proximal distention of the tissue secondary to the trauma     Assessment:  Traumatized right hallux nail with possible fracture     Plan:  H&P reviewed x-rays discussed condition at great length reviewed pros and cons of nail removal and at this point we are can utilize soaks Band-Aid usage wide shoes and if it were to change or get a need to remove the nail that I educated him on today  X-rays were negative for signs of fracture associated with this appears to be soft tissue injury

## 2020-10-27 ENCOUNTER — Other Ambulatory Visit (HOSPITAL_COMMUNITY): Payer: Self-pay | Admitting: Oral & Maxillofacial Surgery

## 2020-11-21 ENCOUNTER — Other Ambulatory Visit (HOSPITAL_COMMUNITY): Payer: Self-pay

## 2020-11-28 ENCOUNTER — Other Ambulatory Visit: Payer: Self-pay | Admitting: Pharmacist

## 2020-11-28 NOTE — Progress Notes (Signed)
Med list updated

## 2021-01-31 ENCOUNTER — Other Ambulatory Visit: Payer: Self-pay | Admitting: Family Medicine

## 2021-01-31 ENCOUNTER — Other Ambulatory Visit (HOSPITAL_COMMUNITY): Payer: Self-pay

## 2021-01-31 MED ORDER — EPINEPHRINE 0.3 MG/0.3ML IJ SOAJ
0.3000 mg | Freq: Once | INTRAMUSCULAR | 5 refills | Status: AC
  Filled 2021-01-31: qty 2, 30d supply, fill #0

## 2021-02-14 ENCOUNTER — Other Ambulatory Visit (HOSPITAL_COMMUNITY): Payer: Self-pay

## 2021-02-14 MED FILL — Atorvastatin Calcium Tab 80 MG (Base Equivalent): ORAL | 90 days supply | Qty: 90 | Fill #0 | Status: AC

## 2021-02-14 MED FILL — Aspirin Tab Delayed Release 81 MG: ORAL | 90 days supply | Qty: 90 | Fill #0 | Status: AC

## 2021-02-14 MED FILL — Metoprolol Succinate Tab ER 24HR 25 MG (Tartrate Equiv): ORAL | 90 days supply | Qty: 90 | Fill #0 | Status: AC

## 2021-02-14 MED FILL — Losartan Potassium Tab 25 MG: ORAL | 90 days supply | Qty: 90 | Fill #0 | Status: AC

## 2021-02-16 ENCOUNTER — Ambulatory Visit: Payer: Medicare Other | Attending: Internal Medicine

## 2021-02-16 ENCOUNTER — Other Ambulatory Visit (HOSPITAL_BASED_OUTPATIENT_CLINIC_OR_DEPARTMENT_OTHER): Payer: Self-pay

## 2021-02-16 ENCOUNTER — Other Ambulatory Visit: Payer: Self-pay

## 2021-02-16 DIAGNOSIS — Z23 Encounter for immunization: Secondary | ICD-10-CM

## 2021-02-16 MED ORDER — PFIZER-BIONT COVID-19 VAC-TRIS 30 MCG/0.3ML IM SUSP
INTRAMUSCULAR | 0 refills | Status: DC
  Filled 2021-02-16: qty 0.3, 1d supply, fill #0

## 2021-02-16 NOTE — Progress Notes (Signed)
   Covid-19 Vaccination Clinic  Name:  Dale Gonzalez    MRN: [id number] DOB: December 16, 1950  02/16/2021  Mr. Skelley was observed post Covid-19 immunization for 15 minutes without incident. He was provided with Vaccine Information Sheet and instruction to access the V-Safe system.   Mr. Nishi was instructed to call 911 with any severe reactions post vaccine: Difficulty breathing  Swelling of face and throat  A fast heartbeat  A bad rash all over body  Dizziness and weakness   Immunizations Administered     Name Date Dose VIS Date Route   PFIZER Comrnaty(Gray TOP) Covid-19 Vaccine 02/16/2021  9:27 AM 0.3 mL 07/28/2020 Intramuscular   Manufacturer: [employer]   Lot: [id number]   [place]: [phone]

## 2021-03-01 ENCOUNTER — Other Ambulatory Visit (HOSPITAL_COMMUNITY): Payer: Self-pay

## 2021-03-02 ENCOUNTER — Ambulatory Visit: Payer: Medicare Other | Admitting: Podiatry

## 2021-03-02 ENCOUNTER — Other Ambulatory Visit: Payer: Self-pay

## 2021-03-02 ENCOUNTER — Encounter: Payer: Self-pay | Admitting: Podiatry

## 2021-03-02 DIAGNOSIS — G5762 Lesion of plantar nerve, left lower limb: Secondary | ICD-10-CM | POA: Diagnosis not present

## 2021-03-02 DIAGNOSIS — S90211A Contusion of right great toe with damage to nail, initial encounter: Secondary | ICD-10-CM

## 2021-03-02 DIAGNOSIS — D3610 Benign neoplasm of peripheral nerves and autonomic nervous system, unspecified: Secondary | ICD-10-CM

## 2021-03-03 NOTE — Progress Notes (Signed)
Subjective:   Patient ID: Dale Gonzalez, male   DOB: 70 y.o.   MRN: [id number]   HPI Patient presents with 2 concerns 1 being a damaged right big toenail that is partially lifted off the distal two thirds of the bed and the other being discomfort with patient developing shooting pains between third and fourth toes.  States is getting ready to go on a golf trip and is concerned about these 2 conditions   ROS      Objective:  Physical Exam  Neurovascular status intact with a deformed nail bed right hallux distal two thirds that is partially loose and no active drainage noted.  On the left there is shooting pains between the third and fourth toe that are moderate in intensity with a positive Biagio Borg sign     Assessment:  2 separate problems with 1 being damaged of the right hallux nail with lifting of the bed and the second being probable neuroma symptomatology left     Plan:  H&P reviewed both conditions and for the nailbed right debrided off the nail bed applied sterile dressing and discussed possible nail removal in the future and for the left sterile prep and injected the third interspace with a neuroma injection consisting of Xylocaine Marcaine and a small amount of steroid agent.  Patient will be seen back to reevaluate

## 2021-04-03 ENCOUNTER — Ambulatory Visit (INDEPENDENT_AMBULATORY_CARE_PROVIDER_SITE_OTHER): Payer: Medicare Other

## 2021-04-03 DIAGNOSIS — Z0000 Encounter for general adult medical examination without abnormal findings: Secondary | ICD-10-CM

## 2021-04-03 NOTE — Patient Instructions (Signed)
Mr. Dale Gonzalez , Thank you for taking time to come for your Medicare Wellness Visit. I appreciate your ongoing commitment to your health goals. Please review the following plan we discussed and let me know if I can assist you in the future.   Screening recommendations/referrals: Colonoscopy: Done 12/11/11 repeat every 10 years due 12/10/21 Recommended yearly ophthalmology/optometry visit for glaucoma screening and checkup Recommended yearly dental visit for hygiene and checkup  Vaccinations: Influenza vaccine: Due this season  Pneumococcal vaccine: Due and discussed  Tdap vaccine: Done 02/18/14 repeat every 10 years due 02/19/24 Shingles vaccine: Completed 07/15/17 & 10/17/17    Covid-19: Completed 1/16, 2/3, & 02/16/21  Advanced directives: Please bring a copy of your health care power of attorney and living will to the office at your convenience.  Conditions/risks identified: Continue exercise routine   Next appointment: Follow up in one year for your annual wellness visit.   Preventive Care 71 Years and Older, Male Preventive care refers to lifestyle choices and visits with your health care provider that can promote health and wellness. What does preventive care include? A yearly physical exam. This is also called an annual well check. Dental exams once or twice a year. Routine eye exams. Ask your health care provider how often you should have your eyes checked. Personal lifestyle choices, including: Daily care of your teeth and gums. Regular physical activity. Eating a healthy diet. Avoiding tobacco and drug use. Limiting alcohol use. Practicing safe sex. Taking low doses of aspirin every day. Taking vitamin and mineral supplements as recommended by your health care provider. What happens during an annual well check? The services and screenings done by your health care provider during your annual well check will depend on your age, overall health, lifestyle risk factors, and family  history of disease. Counseling  Your health care provider may ask you questions about your: Alcohol use. Tobacco use. Drug use. Emotional well-being. Home and relationship well-being. Sexual activity. Eating habits. History of falls. Memory and ability to understand (cognition). Work and work Statistician. Screening  You may have the following tests or measurements: Height, weight, and BMI. Blood pressure. Lipid and cholesterol levels. These may be checked every 5 years, or more frequently if you are over 71 years old. Skin check. Lung cancer screening. You may have this screening every year starting at age 71 if you have a 30-pack-year history of smoking and currently smoke or have quit within the past 15 years. Fecal occult blood test (FOBT) of the stool. You may have this test every year starting at age 14. Flexible sigmoidoscopy or colonoscopy. You may have a sigmoidoscopy every 5 years or a colonoscopy every 10 years starting at age 5. Prostate cancer screening. Recommendations will vary depending on your family history and other risks. Hepatitis C blood test. Hepatitis B blood test. Sexually transmitted disease (STD) testing. Diabetes screening. This is done by checking your blood sugar (glucose) after you have not eaten for a while (fasting). You may have this done every 1-3 years. Abdominal aortic aneurysm (AAA) screening. You may need this if you are a current or former smoker. Osteoporosis. You may be screened starting at age 65 if you are at high risk. Talk with your health care provider about your test results, treatment options, and if necessary, the need for more tests. Vaccines  Your health care provider may recommend certain vaccines, such as: Influenza vaccine. This is recommended every year. Tetanus, diphtheria, and acellular pertussis (Tdap, Td) vaccine. You may need  a Td booster every 10 years. Zoster vaccine. You may need this after age 25. Pneumococcal  13-valent conjugate (PCV13) vaccine. One dose is recommended after age 40. Pneumococcal polysaccharide (PPSV23) vaccine. One dose is recommended after age 69. Talk to your health care provider about which screenings and vaccines you need and how often you need them. This information is not intended to replace advice given to you by your health care provider. Make sure you discuss any questions you have with your health care provider. Document Released: 09/02/2015 Document Revised: 04/25/2016 Document Reviewed: 06/07/2015 Elsevier Interactive Patient Education  2017 [place] Prevention in the Home Falls can cause injuries. They can happen to people of all ages. There are many things you can do to make your home safe and to help prevent falls. What can I do on the outside of my home? Regularly fix the edges of walkways and driveways and fix any cracks. Remove anything that might make you trip as you walk through a door, such as a raised step or threshold. Trim any bushes or trees on the path to your home. Use bright outdoor lighting. Clear any walking paths of anything that might make someone trip, such as rocks or tools. Regularly check to see if handrails are loose or broken. Make sure that both sides of any steps have handrails. Any raised decks and porches should have guardrails on the edges. Have any leaves, snow, or ice cleared regularly. Use sand or salt on walking paths during winter. Clean up any spills in your garage right away. This includes oil or grease spills. What can I do in the bathroom? Use night lights. Install grab bars by the toilet and in the tub and shower. Do not use towel bars as grab bars. Use non-skid mats or decals in the tub or shower. If you need to sit down in the shower, use a plastic, non-slip stool. Keep the floor dry. Clean up any water that spills on the floor as soon as it happens. Remove soap buildup in the tub or shower regularly. Attach  bath mats securely with double-sided non-slip rug tape. Do not have throw rugs and other things on the floor that can make you trip. What can I do in the bedroom? Use night lights. Make sure that you have a light by your bed that is easy to reach. Do not use any sheets or blankets that are too big for your bed. They should not hang down onto the floor. Have a firm chair that has side arms. You can use this for support while you get dressed. Do not have throw rugs and other things on the floor that can make you trip. What can I do in the kitchen? Clean up any spills right away. Avoid walking on wet floors. Keep items that you use a lot in easy-to-reach places. If you need to reach something above you, use a strong step stool that has a grab bar. Keep electrical cords out of the way. Do not use floor polish or wax that makes floors slippery. If you must use wax, use non-skid floor wax. Do not have throw rugs and other things on the floor that can make you trip. What can I do with my stairs? Do not leave any items on the stairs. Make sure that there are handrails on both sides of the stairs and use them. Fix handrails that are broken or loose. Make sure that handrails are as long as the stairways. Check  any carpeting to make sure that it is firmly attached to the stairs. Fix any carpet that is loose or worn. Avoid having throw rugs at the top or bottom of the stairs. If you do have throw rugs, attach them to the floor with carpet tape. Make sure that you have a light switch at the top of the stairs and the bottom of the stairs. If you do not have them, ask someone to add them for you. What else can I do to help prevent falls? Wear shoes that: Do not have high heels. Have rubber bottoms. Are comfortable and fit you well. Are closed at the toe. Do not wear sandals. If you use a stepladder: Make sure that it is fully opened. Do not climb a closed stepladder. Make sure that both sides of the  stepladder are locked into place. Ask someone to hold it for you, if possible. Clearly mark and make sure that you can see: Any grab bars or handrails. First and last steps. Where the edge of each step is. Use tools that help you move around (mobility aids) if they are needed. These include: Canes. Walkers. Scooters. Crutches. Turn on the lights when you go into a dark area. Replace any light bulbs as soon as they burn out. Set up your furniture so you have a clear path. Avoid moving your furniture around. If any of your floors are uneven, fix them. If there are any pets around you, be aware of where they are. Review your medicines with your doctor. Some medicines can make you feel dizzy. This can increase your chance of falling. Ask your doctor what other things that you can do to help prevent falls. This information is not intended to replace advice given to you by your health care provider. Make sure you discuss any questions you have with your health care provider. Document Released: 06/02/2009 Document Revised: 01/12/2016 Document Reviewed: 09/10/2014 Elsevier Interactive Patient Education  2017 [employer].

## 2021-04-03 NOTE — Progress Notes (Signed)
Virtual Visit via Telephone Note  I connected with  Dale Gonzalez on 04/03/21 at  8:00 AM EDT by telephone and verified that I am speaking with the correct person using two identifiers.  Medicare Annual Wellness visit completed telephonically due to Covid-19 pandemic.   Persons participating in this call: This Health Coach and this patient.   Location: Patient: Home Provider: office   I discussed the limitations, risks, security and privacy concerns of performing an evaluation and management service by telephone and the availability of in person appointments. The patient expressed understanding and agreed to proceed.  Unable to perform video visit due to video visit attempted and failed and/or patient does not have video capability.   Some vital signs may be absent or patient reported.   Willette Brace, LPN   Subjective:   Dale Gonzalez is a 70 y.o. male who presents for Medicare Annual/Subsequent preventive examination.  Review of Systems     Cardiac Risk Factors include: advanced age (>72mn, >>68women);hypertension;dyslipidemia;male gender     Objective:    There were no vitals filed for this visit. There is no height or weight on file to calculate BMI.  Advanced Directives 04/03/2021 03/28/2020 08/08/2017  Does Patient Have a Medical Advance Directive? Yes Yes No  Type of Advance Directive Living will [place] will -  Copy of [place] Chart? - No - copy requested -  Would patient like information on creating a medical advance directive? - - No - Patient declined    Current Medications (verified) Outpatient Encounter Medications as of 04/03/2021  Medication Sig   aspirin 81 MG EC tablet TAKE 1 TABLET (81 MG TOTAL) BY MOUTH DAILY. SWALLOW WHOLE.   atorvastatin (LIPITOR) 80 MG tablet TAKE 1 TABLET (80 MG TOTAL) BY MOUTH DAILY.   losartan (COZAAR) 25 MG tablet TAKE 1 TABLET (25 MG TOTAL) BY MOUTH DAILY.   metoprolol  succinate (TOPROL-XL) 25 MG 24 hr tablet TAKE 1 TABLET (25 MG TOTAL) BY MOUTH DAILY.   nitroGLYCERIN (NITROSTAT) 0.4 MG SL tablet PLACE 1 TABLET (0.4 MG TOTAL) UNDER THE TONGUE EVERY 5 (FIVE) MINUTES AS NEEDED FOR CHEST PAIN.   chlorhexidine (PERIDEX) 0.12 % solution SWISH 1 CAP FULL IN THE MOUTH 2 TIMES PER DAY, SWISH IN MOUTH FOR 30 SECONDS THEN SPIT OUT (Patient not taking: Reported on 04/03/2021)   COVID-19 mRNA Vac-TriS, Pfizer, (PFIZER-BIONT COVID-19 VAC-TRIS) SUSP injection Inject into the muscle.   No facility-administered encounter medications on file as of 04/03/2021.    Allergies (verified) Bee venom   History: Past Medical History:  Diagnosis Date   Basal cell cancer    right cheek; ?left arm; Dr DWilhemina Bonito  Chicken pox    Coronary artery disease    Hyperlipidemia    LDL goal = <115   [place]   RAD (reactive airway disease) 1998   [place]  Seasonal allergies    Past Surgical History:  Procedure Laterality Date   COLONOSCOPY  2003 & 2013   negative; Dr PSharlett Iles  CORONARY ANGIOPLASTY WITH STENT PLACEMENT  07/27/2017; 08/08/2017   CORONARY STENT INTERVENTION N/A 08/08/2017   Procedure: CORONARY STENT INTERVENTION;  Surgeon: PNigel Mormon MD;  [facility];  Service: Cardiovascular;  Laterality: N/A;   EYE SURGERY     right eye lid  dropping procedure 9/21   eyelid surgery     bilateral- did  not work well for right eyelid    INTRAVASCULAR ULTRASOUND/IVUS N/A 08/08/2017   Procedure: Intravascular Ultrasound/IVUS;  Surgeon: Nigel Mormon, MD;  Location: [facility];  Service: Cardiovascular;  Laterality: N/A;   LEFT HEART CATH AND CORONARY ANGIOGRAPHY N/A 08/08/2017   Procedure: LEFT HEART CATH AND CORONARY ANGIOGRAPHY;  Surgeon: Nigel Mormon, MD;  Location: [facility];  Service: Cardiovascular;  Laterality: N/A;   MOHS SURGERY     "right face; ?left arm"   ROOT  CANAL  01/04/2020   Bilateral   TONSILLECTOMY     WRIST GANGLION EXCISION Left    Dr Wonda Olds   Family History  Problem Relation Age of Onset   Cancer Mother         cervical lymph nodes. mother has not shared primary cancer with him.    Heart disease Mother    Hypertension Mother    Hypertension Father    Heart disease Father        bypass surgery   Heart attack Father 2       Colostomy for ? diagnosis   COPD Father    Cancer Father        was not shared with patient primary cancer.    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    Colon cancer Neg Hx    Esophageal cancer Neg Hx    Stomach cancer Neg Hx    Rectal cancer Neg Hx    Stroke Neg Hx    Diabetes Neg Hx    Social History   Socioeconomic History   Marital status: Married    Spouse name: Not on file   Number of children: 2   Years of education: Not on file   Highest education level: Not on file  Occupational History   Occupation: Retired  Tobacco Use   Smoking status: Never   Smokeless tobacco: Never  Vaping Use   Vaping Use: Never used  Substance and Sexual Activity   Alcohol use: Yes    Alcohol/week: 1.0 standard drink    Types: 1 Shots of liquor per week    Comment: occ   Drug use: No   Sexual activity: Not Currently  Other Topics Concern   Not on file  Social History Narrative   Married 45 years in 2021.  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    Works part time for his prior business- may stop in 2021- about 10 hours a week      Hobbies: enjoys spending time with his grandkids, regular exercise. Active in his church- [employer].    Social Determinants of Health   Financial Resource Strain: Low Risk     Difficulty of Paying Living Expenses: Not hard at all  Food Insecurity: No Food Insecurity   Worried About Charity fundraiser in the Last Year: Never true   [place] in the Last Year: Never true  Transportation Needs: No Transportation Needs   Lack of Transportation (Medical): No   Lack of Transportation (Non-Medical): No  Physical Activity: Sufficiently Active   Days of Exercise per Week: 7 days   Minutes of  Exercise per Session: 150+ min  Stress: No Stress Concern Present   Feeling of Stress : Not at all  Social Connections: Socially Integrated   Frequency of Communication with Friends and Family: More than three times a week   Frequency of Social Gatherings with Friends and Family: More than three times a week   Attends Religious Services: More than 4 times per year   Active Member of [employer] or Organizations: Yes   Attends Archivist Meetings: 1 to 4 times per year   Marital Status: Married    Tobacco Counseling Counseling given: Not Answered   Clinical Intake:  Pre-visit preparation completed: Yes  Pain : No/denies pain     BMI - recorded: 26.16 Nutritional Status: BMI 25 -29 Overweight Nutritional Risks: None Diabetes: No  How often do you need to have someone help you when you read instructions, pamphlets, or other written materials from your doctor or pharmacy?: 1 - Never  Diabetic?No  Interpreter Needed?: No  Information entered by :: Charlott Rakes, LPN   Activities of Daily Living In your present state of health, do you have any difficulty performing the following activities: 04/03/2021  Hearing? N  Vision? N  Difficulty concentrating or making decisions? Y  Comment short term memory  Walking or climbing stairs? N  Dressing or bathing? N  Doing errands, shopping? N  Preparing Food and eating ? N  Using the Toilet? N  In the past six months, have you accidently leaked urine? N  Do you have problems with loss of bowel control? N   Managing your Medications? N  Managing your Finances? N  Housekeeping or managing your Housekeeping? N  Some recent data might be hidden    Patient Care Team: Marin Olp, MD as PCP - General (Family Medicine) Nigel Mormon, MD as Consulting Physician (Cardiology) Danella Sensing, MD as Consulting Physician (Dermatology) Luberta Mutter, MD as Consulting Physician (Ophthalmology) Paulla Dolly Tamala Fothergill, DPM as Consulting Physician (Podiatry) Solon Augusta, MD as Attending Physician (Ophthalmology) Madelin Rear, [facility] as Pharmacist (Pharmacist)  Indicate any recent Medical Services you may have received from other than [facility] providers in the past year (date may be approximate).     Assessment:   This is a routine wellness examination for Leith.  Hearing/Vision screen Hearing Screening - Comments:: Pt denies any hearing issues  Vision Screening - Comments:: Pt follows up with [facility] for annual eye exams   Dietary issues and exercise activities discussed: Current Exercise Habits: Home exercise routine, Type of exercise: Other - see comments;walking (golfing), Time (Minutes): > 60, Frequency (Times/Week): 7, Weekly Exercise (Minutes/Week): 0   Goals Addressed             This Visit's Progress    Patient Stated       Continue on exercise routine        Depression Screen PHQ 2/9 Scores 04/03/2021 04/19/2020 03/28/2020 06/19/2019 10/07/2017 09/16/2017  PHQ - 2 Score 0 0 0 0 0 0  PHQ- 9 Score - 0 - 0 - -    Fall Risk Fall Risk  04/03/2021 04/19/2020 03/28/2020 01/04/2020 06/19/2019  Falls in the past year? - 0 0 0 0  Number falls in past yr: 0 0 0 - 0  Injury with Fall? 0 0 0 - 0  Risk for fall due to : Impaired vision - Impaired vision - -  Follow up Falls prevention discussed - Falls prevention discussed - -    FALL RISK  PREVENTION PERTAINING TO THE HOME:  Any stairs in or around the home? Yes  If so, are there any without handrails? No  Home free of loose  throw rugs in walkways, pet beds, electrical cords, etc? Yes  Adequate lighting in your home to reduce risk of falls? Yes   ASSISTIVE DEVICES UTILIZED TO PREVENT FALLS:  Life alert? No  Use of a cane, walker or w/c? No  Grab bars in the bathroom? No  Shower chair or bench in shower? No  Elevated toilet seat or a handicapped toilet? No   TIMED UP AND GO:  Was the test performed? No .   Cognitive Function:     6CIT Screen 04/03/2021 03/28/2020  What Year? 0 points -  What month? 0 points -  What time? 0 points -  Count back from 20 0 points 0 points  Months in reverse 0 points 0 points  Repeat phrase 0 points 0 points  Total Score 0 -    Immunizations Immunization History  Administered Date(s) Administered   Fluad Quad(high Dose 65+) 05/17/2020   Influenza Whole 07/08/2007, 05/20/2008, 08/04/2008, 06/01/2010   Influenza,inj,Quad PF,6+ Mos 05/18/2014   Influenza-Unspecified 07/01/2017, 04/08/2019   PFIZER Comirnaty(Gray Top)Covid-19 Tri-Sucrose Vaccine 02/16/2021   PFIZER(Purple Top)SARS-COV-2 Vaccination 09/05/2019, 09/23/2019, 05/11/2020   Pneumococcal Conjugate-13 08/02/2015, 03/27/2016   Td 12/24/2008   Tdap 02/18/2014   Zoster Recombinat (Shingrix) 07/15/2017, 10/17/2017   Zoster, Live 01/04/2011    TDAP status: Up to date  Flu Vaccine status: Due, Education has been provided regarding the importance of this vaccine. Advised may receive this vaccine at local pharmacy or Health Dept. Aware to provide a copy of the vaccination record if obtained from local pharmacy or Health Dept. Verbalized acceptance and understanding.  Pneumococcal vaccine status: Due, Education has been provided regarding the importance of this vaccine. Advised may receive this vaccine at local pharmacy or Health Dept. Aware to provide a copy of the vaccination record if obtained from local pharmacy or Health Dept. Verbalized acceptance and understanding.  Covid-19 vaccine status: Completed  vaccines  Qualifies for Shingles Vaccine? Yes   Zostavax completed Yes   Shingrix Completed?: Yes  Screening Tests Health Maintenance  Topic Date Due   PNA vac Low Risk Adult (2 of 2 - PPSV23) 03/27/2017   INFLUENZA VACCINE  03/20/2021   COVID-19 Vaccine (5 - Booster for Pfizer series) 06/18/2021   COLONOSCOPY (Pts 45-31yr Insurance coverage will need to be confirmed)  12/10/2021   TETANUS/TDAP  02/19/2024   Hepatitis C Screening  Completed   Zoster Vaccines- Shingrix  Completed   HPV VACCINES  Aged Out    Health Maintenance  Health Maintenance Due  Topic Date Due   PNA vac Low Risk Adult (2 of 2 - PPSV23) 03/27/2017   INFLUENZA VACCINE  03/20/2021    Colorectal cancer screening: Type of screening: Colonoscopy. Completed 12/11/11. Repeat every 10 years   Additional Screening:  Hepatitis C Screening:  Completed 08/02/15  Vision Screening: Recommended annual ophthalmology exams for early detection of glaucoma and other disorders of the eye. Is the patient up to date with their annual eye exam?  Yes  Who is the provider or what is the name of the office in which the patient attends annual eye exams? Dr [facility] If pt is not established with a provider, would they like to be referred to a provider to establish care? No .   Dental Screening: Recommended annual dental exams for proper oral hygiene  Community  Resource Referral / Chronic Care Management: CRR required this visit?  No   CCM required this visit?  No      Plan:     I have personally reviewed and noted the following in the patient's chart:   Medical and social history Use of alcohol, tobacco or illicit drugs  Current medications and supplements including opioid prescriptions. Patient is not currently taking opioid prescriptions. Functional ability and status Nutritional status Physical activity Advanced directives List of other physicians Hospitalizations, surgeries, and ER visits in previous 12  months Vitals Screenings to include cognitive, depression, and falls Referrals and appointments  In addition, I have reviewed and discussed with patient certain preventive protocols, quality metrics, and best practice recommendations. A written personalized care plan for preventive services as well as general preventive health recommendations were provided to patient.     Willette Brace, LPN   [id number]   Nurse Notes: None

## 2021-04-20 NOTE — Progress Notes (Incomplete)
Phone: [phone]   Subjective:  Patient presents today for their annual physical. Chief complaint-noted.   See problem oriented charting- ROS- full  review of systems was completed and negative  except for: ***  The following were reviewed and entered/updated in epic: Past Medical History:  Diagnosis Date   Basal cell cancer    right cheek; ?left arm; Dr Wilhemina Bonito   Chicken pox    Coronary artery disease    Hyperlipidemia    LDL goal = <115   Myocardial infarction ([place]) 07/27/2017   RAD (reactive airway disease) 1998   cold induced cough @ [facility] allergies    Patient Active Problem List   Diagnosis Date Noted   GERD (gastroesophageal reflux disease) 08/04/2019   Coronary artery disease s/p PTCA x 5 in 07/2017 10/10/2018   Elevated blood pressure reading without diagnosis of hypertension 05/18/2014   Nonspecific abnormal results of thyroid function study 01/12/2014   Exercise-induced asthma primarily cold induced 07/24/2013   Skin cancer, basal cell 12/27/2011   Microscopic hematuria 12/27/2011   Allergic rhinitis 12/19/2007   Dyslipidemia 12/18/2006   Past Surgical History:  Procedure Laterality Date   COLONOSCOPY  2003 & 2013   negative; Dr Sharlett Iles   CORONARY ANGIOPLASTY WITH STENT PLACEMENT  07/27/2017; 08/08/2017   CORONARY STENT INTERVENTION N/A 08/08/2017   Procedure: CORONARY STENT INTERVENTION;  Surgeon: Nigel Mormon, MD;  Location: [place] [facility];  Service: Cardiovascular;  Laterality: N/A;   EYE SURGERY     right eye lid  dropping procedure 9/21   eyelid surgery     bilateral- did not work well for right eyelid    INTRAVASCULAR ULTRASOUND/IVUS N/A 08/08/2017   Procedure: Intravascular Ultrasound/IVUS;  Surgeon: Nigel Mormon, MD;  Location: [facility] [place] [facility];  Service: Cardiovascular;  Laterality: N/A;   LEFT HEART CATH AND CORONARY ANGIOGRAPHY N/A 08/08/2017   Procedure: LEFT HEART CATH AND CORONARY ANGIOGRAPHY;   Surgeon: Nigel Mormon, MD;  Location: [place] [facility];  Service: Cardiovascular;  Laterality: N/A;   MOHS SURGERY     "right face; ?left arm"   ROOT CANAL  01/04/2020   Bilateral   TONSILLECTOMY     WRIST GANGLION EXCISION Left    Dr Wonda Olds    Family History  Problem Relation Age of Onset   Cancer Mother         cervical lymph nodes. mother has not shared primary cancer with him.    Heart disease Mother    Hypertension Mother    Hypertension Father    Heart disease Father        bypass surgery   Heart attack Father 87       Colostomy for ? diagnosis   COPD Father    Cancer Father        was not shared with patient primary cancer.    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    Colon cancer Neg Hx    Esophageal cancer Neg Hx    Stomach cancer Neg Hx    Rectal cancer Neg Hx    Stroke Neg Hx    Diabetes Neg Hx     Medications- reviewed and updated Current Outpatient Medications  Medication Sig Dispense Refill   aspirin 81 MG EC tablet TAKE 1 TABLET (81  MG TOTAL) BY MOUTH DAILY. SWALLOW WHOLE. 90 tablet 3   atorvastatin (LIPITOR) 80 MG tablet TAKE 1 TABLET (80 MG TOTAL) BY MOUTH DAILY. 90 tablet 3   chlorhexidine (PERIDEX) 0.12 % solution SWISH 1 CAP FULL IN THE MOUTH 2 TIMES PER DAY, SWISH IN MOUTH FOR 30 SECONDS THEN SPIT OUT (Patient not taking: Reported on 04/03/2021) 473 mL 0   COVID-19 mRNA Vac-TriS, Pfizer, (PFIZER-BIONT COVID-19 VAC-TRIS) SUSP injection Inject into the muscle. 0.3 mL 0   losartan (COZAAR) 25 MG tablet TAKE 1 TABLET (25 MG TOTAL) BY MOUTH DAILY. 90 tablet 3   metoprolol succinate (TOPROL-XL) 25 MG 24 hr tablet TAKE 1 TABLET (25 MG TOTAL) BY MOUTH DAILY. 90 tablet 3   nitroGLYCERIN (NITROSTAT) 0.4 MG SL tablet PLACE 1 TABLET (0.4 MG TOTAL) UNDER THE TONGUE EVERY 5 (FIVE) MINUTES AS NEEDED FOR CHEST  PAIN. 25 tablet 3   No current facility-administered medications for this visit.    Allergies-reviewed and updated Allergies  Allergen Reactions   Bee Venom Anaphylaxis and Swelling    Swelling of the throat    Social History   Social History Narrative   Married 45 years in 2021.  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    Works part time for his prior business- may stop in 2021- about 10 hours a week      Hobbies: enjoys spending time with his grandkids, regular exercise. Active in his church- [employer].    Objective  Objective:  There were no vitals taken for this visit. Gen: NAD, resting comfortably HEENT: Mucous membranes are moist. Oropharynx normal Neck: no thyromegaly CV: RRR no murmurs rubs or gallops Lungs: CTAB no crackles, wheeze, rhonchi Abdomen: soft/nontender/nondistended/normal bowel sounds. No rebound or guarding.  Ext: no edema Skin: warm, dry Neuro: grossly normal, moves all extremities, PERRLA ***   Assessment and Plan  70 y.o. male presenting for annual physical.  Health Maintenance counseling: 1. Anticipatory guidance: Patient counseled regarding regular dental exams -q6 months***, eye exams -yearly***,  avoiding smoking and second hand smoke*** , limiting alcohol to 2 beverages per day ***.   2. Risk factor reduction:  Advised patient of need for regular exercise and diet rich and fruits and vegetables to reduce risk of heart attack and stroke. Exercise- 6-7 days per week***. Diet- reasonably healthy diet***.  Wt Readings from Last 3 Encounters:  08/25/20 167 lb (75.8 kg)  04/19/20 168 lb (76.2 kg)  01/05/20 167 lb (75.8 kg)   3. Immunizations/screenings/ancillary studies- Prevnar 20-*** ans Flu vaccination-*** - otherwise up-to-date. Immunization History  Administered Date(s) Administered   Fluad Quad(high  Dose 65+) 05/17/2020   Influenza Whole 07/08/2007, 05/20/2008, 08/04/2008, 06/01/2010   Influenza,inj,Quad PF,6+ Mos 05/18/2014   Influenza-Unspecified 07/01/2017, 04/08/2019   PFIZER Comirnaty(Gray Top)Covid-19 Tri-Sucrose Vaccine 02/16/2021   PFIZER(Purple Top)SARS-COV-2 Vaccination 09/05/2019, 09/23/2019, 05/11/2020   Pneumococcal Conjugate-13 08/02/2015, 03/27/2016   Td 12/24/2008   Tdap 02/18/2014   Zoster Recombinat (Shingrix) 07/15/2017, 10/17/2017   Zoster, Live 01/04/2011   Health Maintenance Due  Topic Date Due   PNA vac Low Risk Adult (2 of 2 - PPSV23) 03/27/2017   INFLUENZA VACCINE  03/20/2021   4. Prostate cancer screening- Since 2010 his PSA has ranged from a low of 0.4 to a high of 1.32. In May of 2012 it was 0.78. He had  a PSA with [facility] in the past that was 0.40 in august 2020. Patient has nocturia *** per *** .*** Lab Results  Component Value Date   PSA 0.6 04/28/2020   PSA 0.53 01/24/2015   PSA 0.78 12/20/2010   5. Colon cancer screening - last on 12/11/2011 with a 10-year repeat planned *** 6. Skin cancer screening- follows with Dr. Wilhemina Bonito from [facility]***advised regular sunscreen use. Denies worrisome, changing, or new skin lesions.  7. Never smoker 8. STD screening - declines - monogamous***  Status of chronic or acute concerns   #hypertension S: medication: ***Metoprolol 25 mg daily, Losartan 25 mg daily Home readings #s: *** BP Readings from Last 3 Encounters:  08/25/20 123/76  04/19/20 126/80  01/05/20 140/88  A/P: ***  #Coronary artery disease-status post PTCAx5 total (x3  December 2018)-last cardiology visit January 2021 with plan to continue Brilinta through December 2021-possibly stopped metoprolol as well #hyperlipidemia S: Medication:***  Atorvastatin 80 mg daily, Brilinta, aspirin.  Patient also on metoprolol and losartan per cardiology without diagnosis of hypertension though prior PCP had listed hypertension Lab Results   Component Value Date   CHOL 97 04/28/2020   HDL 30 (L) 04/28/2020   LDLCALC 49 04/28/2020   TRIG 99 04/28/2020   CHOLHDL 3.2 04/28/2020   A/P: ***  #Elevated TSH in the past- *** Lab Results  Component Value Date   TSH 5.04 (H) 04/28/2020   #History microscopic hematuria-has seen Dr. Amalia Hailey of urology in the past. - From Dr. Linna Darner notes "Urinalysis  revealed trace hematuria. In 2012 it revealed small to moderate microscopic hematuria. This was evaluated in the past  by Dr. Amalia Hailey, Urologist. It is most likely due to the number of hours he bikes which is 5-6 hours per week on average."  #Exercise-induced asthma primarily cold-induced- ***  *** Health Maintenance Due  Topic Date Due   PNA vac Low Risk Adult (2 of 2 - PPSV23) 03/27/2017   INFLUENZA VACCINE  03/20/2021    Recommended follow up: No follow-ups on file. Future Appointments  Date Time Provider [place]  04/25/2021  8:20 AM Marin Olp, MD [facility]  08/25/2021  9:30 AM Patwardhan, Reynold Bowen, MD [facility] None  04/09/2022  8:00 AM [facility] HEALTH COACH [facility]    No chief complaint on file.  Lab/Order associations:*** fasting   ICD-10-CM   1. Preventative health care  Z00.00       No orders of the defined types were placed in this encounter.  I,Harris Phan,acting as a Education administrator for Garret Reddish, MD.,have documented all relevant documentation on the behalf of Garret Reddish, MD,as directed by  Garret Reddish, MD while in the presence of Garret Reddish, MD.    ***  Return precautions advised.  Pamella Pert

## 2021-04-21 NOTE — Progress Notes (Signed)
Phone [phone] In person visit   Subjective:   Dale Gonzalez is a 70 y.o. year old very pleasant male patient who presents for/with See problem oriented charting No chief complaint on file.  This visit occurred during the SARS-CoV-2 public health emergency.  Safety protocols were in place, including screening questions prior to the visit, additional usage of staff PPE, and extensive cleaning of exam room while observing appropriate contact time as indicated for disinfecting solutions.   Past Medical History-  Patient Active Problem List   Diagnosis Date Noted   Coronary artery disease s/p PTCA x 5 in 07/2017 10/10/2018    Priority: High   Elevated blood pressure reading without diagnosis of hypertension 05/18/2014    Priority: Medium   Nonspecific abnormal results of thyroid function study 01/12/2014    Priority: Medium   Dyslipidemia 12/18/2006    Priority: Medium   Exercise-induced asthma primarily cold induced 07/24/2013    Priority: Low   Skin cancer, basal cell 12/27/2011    Priority: Low   Microscopic hematuria 12/27/2011    Priority: Low   Allergic rhinitis 12/19/2007    Priority: Low   GERD (gastroesophageal reflux disease) 08/04/2019    Medications- reviewed and updated Current Outpatient Medications  Medication Sig Dispense Refill   aspirin 81 MG EC tablet TAKE 1 TABLET (81 MG TOTAL) BY MOUTH DAILY. SWALLOW WHOLE. 90 tablet 3   atorvastatin (LIPITOR) 80 MG tablet TAKE 1 TABLET (80 MG TOTAL) BY MOUTH DAILY. 90 tablet 3   COVID-19 mRNA Vac-TriS, Pfizer, (PFIZER-BIONT COVID-19 VAC-TRIS) SUSP injection Inject into the muscle. 0.3 mL 0   losartan (COZAAR) 25 MG tablet TAKE 1 TABLET (25 MG TOTAL) BY MOUTH DAILY. 90 tablet 3   metoprolol succinate (TOPROL-XL) 25 MG 24 hr tablet TAKE 1 TABLET (25 MG TOTAL) BY MOUTH DAILY. 90 tablet 3   nitroGLYCERIN (NITROSTAT) 0.4 MG SL tablet PLACE 1 TABLET (0.4 MG TOTAL) UNDER THE TONGUE EVERY 5 (FIVE) MINUTES AS NEEDED FOR  CHEST PAIN. 25 tablet 3   chlorhexidine (PERIDEX) 0.12 % solution SWISH 1 CAP FULL IN THE MOUTH 2 TIMES PER DAY, SWISH IN MOUTH FOR 30 SECONDS THEN SPIT OUT (Patient not taking: Reported on 04/03/2021) 473 mL 0   No current facility-administered medications for this visit.     Objective:  BP 128/68   Pulse 67   Temp 98.1 F (36.7 C)   Wt 173 lb 6.4 oz (78.7 kg)   SpO2 97%   BMI 27.16 kg/m  Gen: NAD, resting comfortably CV: RRR no murmurs rubs or gallops Lungs: CTAB no crackles, wheeze, rhonchi Abdomen: soft/nontender/nondistended/normal bowel sounds. No rebound or guarding.  Ext: no edema Skin: warm, dry Neuro: grossly normal, moves all extremities MSk just below 2nd MCP slight nodular area appears to be along tendon- slight contracture as well     Assessment and Plan  # Bump on hand S:noticed on left hand about 3-4 weeks ago a small bump. Slightly larger. Does put a lot of pressure on this area with the bars he uses for push ups. Also does spin bikes and a lot of pressure on this as well . Just below 2ndMCP joint.  A/P: This appears to be similar to Dupuytren's contracture but in a location that is not his typical-I would like to get sports medicine's opinion on this.  Referral was placed to [place] sports medicine.  #hypertension S: medication: Metoprolol 25 mg daily, Losartan 25 mg daily BP Readings from Last 3 Encounters:  05/01/21 128/68  08/25/20 123/76  04/19/20 126/80  A/P: Controlled. Continue current medications.   #Coronary artery disease-status post PTCAx5 total (x3  December 2018)#hyperlipidemia S: Medication: Atorvastatin 80 mg daily and Aspirin 81 mg daily.   -Patient also on metoprolol and losartan per cardiology without diagnosis of hypertension though prior PCP had listed hypertension  - no CP or SOB Lab Results  Component Value Date   CHOL 97 04/28/2020   HDL 30 (L) 04/28/2020   LDLCALC 49 04/28/2020   TRIG 99 04/28/2020   CHOLHDL 3.2 04/28/2020    A/P: Patient is asymptomatic-has follow-up planned in January with Dr. Betsey Holiday will go ahead and update lipid panel.  LDL was very well controlled at last visit.  Likely continue current medication  #Elevated TSH in the past  Lab Results  Component Value Date   TSH 5.04 (H) 04/28/2020    ROS-No hair or nail changes. No heat/cold intolerance. No constipation or diarrhea. Denies shakiness or anxiety.  A/P:hopefully stable TSH or improved- if above 10 would consider treatment  #Screen prostate cancer-low risk trend previously-we will update PSA with labs today-does occasionally have nocturia Lab Results  Component Value Date   PSA 0.6 04/28/2020   PSA 0.53 01/24/2015   PSA 0.78 12/20/2010    #History microscopic hematuria- seen by Dr. Amalia Hailey of urology in the past.   From Dr. Linna Darner notes "Urinalysis  reveal trace hematuria. In 2012 it revealed small to moderate microscopic hematuria. This was evaluated in the past  by Dr. Amalia Hailey, Urologist. It is most likely due to the number of hours he bikes which is 5-6 hours per week on average."  -UA 03/2020 -  Appeared dehydrate and possible blood in urine. Microscope - no blood on f/u urine test-normal test overall. -check urine microscopic alone  #Exercise-induced asthma primarily cold-induced.  No issues recently in years.  Mask in cold weather is helpful  Recommended follow up: keep February visit or can push out a few months from Dr. Earnie Larsson visit Future Appointments  Date Time Provider [place]  08/25/2021  9:30 AM Nigel Mormon, MD [facility] None  09/29/2021  1:20 PM Marin Olp, MD [facility]  04/09/2022  8:00 AM [facility] HEALTH COACH [facility]   Lab/Order associations:   ICD-10-CM   1. Hyperlipidemia, unspecified hyperlipidemia type  E78.5 CBC with Differential/Platelet    Comprehensive metabolic panel    Lipid panel    TSH    2. Elevated blood pressure reading without diagnosis of hypertension  R03.0      3. Coronary artery disease involving native coronary artery of native heart without angina pectoris  I25.10     4. Hematuria, unspecified type  R31.9 Urine Microscopic    5. Hand lesion  L98.9 Ambulatory referral to Sports Medicine    6. Nocturia  R35.1 PSA    7. Elevated TSH  R79.89 TSH     I,Jada Bradford,acting as a scribe for Garret Reddish, MD.,have documented all relevant documentation on the behalf of Garret Reddish, MD,as directed by  Garret Reddish, MD while in the presence of Garret Reddish, MD.  I, Garret Reddish, MD, have reviewed all documentation for this visit. The documentation on 05/01/21 for the exam, diagnosis, procedures, and orders are all accurate and complete.  Return precautions advised.  Garret Reddish, MD

## 2021-04-25 ENCOUNTER — Encounter: Payer: PPO | Admitting: Family Medicine

## 2021-04-25 DIAGNOSIS — E785 Hyperlipidemia, unspecified: Secondary | ICD-10-CM

## 2021-04-25 DIAGNOSIS — Z0000 Encounter for general adult medical examination without abnormal findings: Secondary | ICD-10-CM

## 2021-04-25 DIAGNOSIS — I2510 Atherosclerotic heart disease of native coronary artery without angina pectoris: Secondary | ICD-10-CM

## 2021-04-25 DIAGNOSIS — I10 Essential (primary) hypertension: Secondary | ICD-10-CM

## 2021-05-01 ENCOUNTER — Other Ambulatory Visit: Payer: Self-pay

## 2021-05-01 ENCOUNTER — Ambulatory Visit (INDEPENDENT_AMBULATORY_CARE_PROVIDER_SITE_OTHER): Payer: Medicare Other | Admitting: Family Medicine

## 2021-05-01 ENCOUNTER — Encounter: Payer: Self-pay | Admitting: Family Medicine

## 2021-05-01 VITALS — BP 128/68 | HR 67 | Temp 98.1°F | Wt 173.4 lb

## 2021-05-01 DIAGNOSIS — R351 Nocturia: Secondary | ICD-10-CM | POA: Diagnosis not present

## 2021-05-01 DIAGNOSIS — R319 Hematuria, unspecified: Secondary | ICD-10-CM

## 2021-05-01 DIAGNOSIS — R030 Elevated blood-pressure reading, without diagnosis of hypertension: Secondary | ICD-10-CM | POA: Diagnosis not present

## 2021-05-01 DIAGNOSIS — Z23 Encounter for immunization: Secondary | ICD-10-CM

## 2021-05-01 DIAGNOSIS — L989 Disorder of the skin and subcutaneous tissue, unspecified: Secondary | ICD-10-CM

## 2021-05-01 DIAGNOSIS — I2510 Atherosclerotic heart disease of native coronary artery without angina pectoris: Secondary | ICD-10-CM

## 2021-05-01 DIAGNOSIS — R7989 Other specified abnormal findings of blood chemistry: Secondary | ICD-10-CM | POA: Diagnosis not present

## 2021-05-01 DIAGNOSIS — E785 Hyperlipidemia, unspecified: Secondary | ICD-10-CM

## 2021-05-01 LAB — CBC WITH DIFFERENTIAL/PLATELET
Basophils Absolute: 0 10*3/uL (ref 0.0–0.1)
Basophils Relative: 0.5 % (ref 0.0–3.0)
Eosinophils Absolute: 0.2 10*3/uL (ref 0.0–0.7)
Eosinophils Relative: 2.9 % (ref 0.0–5.0)
HCT: 46.4 % (ref 39.0–52.0)
Hemoglobin: 15.4 g/dL (ref 13.0–17.0)
Lymphocytes Relative: 18.8 % (ref 12.0–46.0)
Lymphs Abs: 1.3 10*3/uL (ref 0.7–4.0)
MCHC: 33.3 g/dL (ref 30.0–36.0)
MCV: 90.5 fl (ref 78.0–100.0)
Monocytes Absolute: 0.6 10*3/uL (ref 0.1–1.0)
Monocytes Relative: 8.4 % (ref 3.0–12.0)
Neutro Abs: 5 10*3/uL (ref 1.4–7.7)
Neutrophils Relative %: 69.4 % (ref 43.0–77.0)
Platelets: 203 10*3/uL (ref 150.0–400.0)
RBC: 5.13 Mil/uL (ref 4.22–5.81)
RDW: 14.1 % (ref 11.5–15.5)
WBC: 7.1 10*3/uL (ref 4.0–10.5)

## 2021-05-01 LAB — COMPREHENSIVE METABOLIC PANEL
ALT: 22 U/L (ref 0–53)
AST: 20 U/L (ref 0–37)
Albumin: 4.4 g/dL (ref 3.5–5.2)
Alkaline Phosphatase: 62 U/L (ref 39–117)
BUN: 19 mg/dL (ref 6–23)
CO2: 27 mEq/L (ref 19–32)
Calcium: 9.3 mg/dL (ref 8.4–10.5)
Chloride: 104 mEq/L (ref 96–112)
Creatinine, Ser: 0.9 mg/dL (ref 0.40–1.50)
GFR: 86.26 mL/min (ref >60.00)
Glucose, Bld: 96 mg/dL (ref 70–99)
Potassium: 4.7 mEq/L (ref 3.5–5.1)
Sodium: 139 mEq/L (ref 135–145)
Total Bilirubin: 0.4 mg/dL (ref 0.2–1.2)
Total Protein: 6.7 g/dL (ref 6.0–8.3)

## 2021-05-01 LAB — LIPID PANEL
Cholesterol: 109 mg/dL (ref 0–200)
HDL: 31.5 mg/dL — ABNORMAL LOW (ref >39.00)
LDL Cholesterol: 46 mg/dL (ref 0–99)
NonHDL: 77.84
Total CHOL/HDL Ratio: 3
Triglycerides: 159 mg/dL — ABNORMAL HIGH (ref 0.0–149.0)
VLDL: 31.8 mg/dL (ref 0.0–40.0)

## 2021-05-01 LAB — URINALYSIS, MICROSCOPIC ONLY

## 2021-05-01 LAB — TSH: TSH: 4.82 u[IU]/mL (ref 0.35–5.50)

## 2021-05-01 LAB — PSA: PSA: 1.48 ng/mL (ref 0.10–4.00)

## 2021-05-01 NOTE — Patient Instructions (Addendum)
Health Maintenance Due  Topic Date Due   Prevnar 1  -today  03/27/2017   INFLUENZA VACCINE   -today 03/20/2021   We will call you within two weeks about your referral to sports medicine. If you do not hear within 2 weeks, give [place] a call.    Please stop by lab before you go If you have mychart- we will send your results within 3 business days of [place] receiving them.  If you do not have mychart- we will call you about results within 5 business days of [place] receiving them.  *please also note that you will see labs on mychart as soon as they post. I will later go in and write notes on them- will say "notes from Dr. Yong Channel"  Recommended follow up: keep February visit or can push out a few months from Dr. Earnie Larsson visit

## 2021-05-02 ENCOUNTER — Other Ambulatory Visit: Payer: Self-pay

## 2021-05-02 DIAGNOSIS — R972 Elevated prostate specific antigen [PSA]: Secondary | ICD-10-CM

## 2021-05-03 ENCOUNTER — Ambulatory Visit: Payer: Medicare Other | Admitting: Family Medicine

## 2021-05-03 ENCOUNTER — Ambulatory Visit: Payer: Self-pay

## 2021-05-03 ENCOUNTER — Other Ambulatory Visit: Payer: Self-pay

## 2021-05-03 VITALS — BP 112/74 | HR 68 | Ht 67.0 in | Wt 174.2 lb

## 2021-05-03 DIAGNOSIS — G8929 Other chronic pain: Secondary | ICD-10-CM

## 2021-05-03 DIAGNOSIS — M79642 Pain in left hand: Secondary | ICD-10-CM

## 2021-05-03 DIAGNOSIS — M720 Palmar fascial fibromatosis [Dupuytren]: Secondary | ICD-10-CM

## 2021-05-03 NOTE — Patient Instructions (Signed)
Thank you for coming in today.   Call or go to the ER if you develop a large red swollen joint with extreme pain or oozing puss.    Take it easy for a few days.   If not improved next step is either further imaging or excisional biopsy.   Dupuytren's Contracture Dupuytren's contracture is a condition in which tissue under the skin of the palm becomes thick. This causes one or more of the fingers to curl inward (contract) toward the palm. After a while, the fingers may not be able to straighten out. This condition affects some or all of the fingers and the palm of the hand. This condition may affect one or both hands. Dupuytren's contracture is a long-term (chronic) condition that develops (progresses) slowly over time. There is no cure, but symptoms can be managed and progression can be slowed with treatment. This condition is usually not dangerous or painful, but it can interfere with everyday tasks. What are the causes? This condition is caused by tissue (fascia) in the palm that gets thicker and tighter. When the fascia thickens, it pulls on the cords of tissue (tendons) that control finger movement. This causes the fingers to contract. The cause of fascia thickening is not known. However, the condition is often passed along from parent to child (inherited). What increases the risk? The following factors may make you more likely to develop this condition: Being 20 years of age or older. Being male. Having a family history of this condition. Using tobacco products, including cigarettes, chewing tobacco, and e-cigarettes. Drinking alcohol excessively. Having diabetes. Having a seizure disorder. What are the signs or symptoms? Early symptoms of this condition may include: Thick, puckered skin on the hand. One or more lumps (nodules) on the palm. Nodules may be tender when they first appear, but they are generally painless. Later symptoms of this condition may include: Thick cords of  tissue in the palm. Fingers curled up toward the palm. Inability to straighten the fingers into their normal position. Though this condition is usually painless, you may have discomfort when holding or grabbing objects. How is this diagnosed? This condition is diagnosed with a physical exam, which may include: Looking at your hands and feeling your palms. This is to check for thickened fascia and nodules. Measuring finger motion. Doing the [place] tabletop test. You may be asked to try to put your hand on a surface, with your palm down and your fingers straight out. How is this treated? There is no cure for this condition, but treatment can relieve discomfort and make symptoms more manageable. Treatment options may include: Physical therapy. This can strengthen your hand and increase flexibility. Occupational therapy. This can help you with everyday tasks that may be more difficult because of your condition. Shots (injections). Substances may be injected into your hand, such as: Medicines that help to decrease swelling (corticosteroids). Proteins (collagenase) to weaken thick tissue. After a collagenase injection, your health care provider may stretch your fingers. Needle aponeurotomy. A needle is pushed through the skin and into the fascia. Moving the needle against the fascia can weaken or break up the thick tissue. Surgery. This may be needed if your condition causes discomfort or interferes with everyday activities. Physical therapy is usually needed after surgery. No treatment is guaranteed to cure this condition. Recurrence of symptoms is common. Follow these instructions at home: Hand care Take these actions to help protect your hand from possible injury: Use tools that have padded grips. Wear  protective gloves while you work with your hands. Avoid repetitive hand movements. General instructions Take over-the-counter and prescription medicines only as told by your health care  provider. Manage any other conditions that you have, such as diabetes. If physical therapy was prescribed, do exercises as told by your health care provider. Do not use any products that contain nicotine or tobacco, such as cigarettes, e-cigarettes, and chewing tobacco. If you need help quitting, ask your health care provider. If you drink alcohol: Limit how much you have to: 0-1 drink a day for women who are not pregnant. 0-2 drinks a day for men. Be aware of how much alcohol is in your drink. In the [place] one drink equals one 12 oz bottle of beer (355 mL), one 5 oz glass of wine (148 mL), or one 1 oz glass of hard liquor (44 mL). Keep all follow-up visits as told by your health care provider. This is important. Contact a health care provider if: You develop new symptoms, or your symptoms get worse. You have pain that gets worse or does not get better with medicine. You have difficulty or discomfort with everyday tasks. You develop numbness or tingling. Get help right away if: You have severe pain. Your fingers change color or become unusually cold. Summary Dupuytren's contracture is a condition in which tissue under the skin of the palm becomes thick. This condition is caused by tissue (fascia) that thickens. When it thickens, it pulls on the cords of tissue (tendons) that control finger movement and makes the fingers contract. You are more likely to develop this condition if you are a man, are over 21 years of age, have a family history of the condition, and drink a lot of alcohol. This condition can be treated with physical and occupational therapy, injections, and surgery. Follow instructions about how to care for your hand. Get help right away if you have severe pain or your fingers change color or become cold. This information is not intended to replace advice given to you by your health care provider. Make sure you discuss any questions you have with your health care  provider. Document Revised: 11/15/2020 Document Reviewed: 11/15/2020 Elsevier Patient Education  [place].

## 2021-05-03 NOTE — Progress Notes (Signed)
   I, Peterson Lombard, LAT, ATC acting as a scribe for Lynne Leader, MD.  Subjective:    CC: L hand nodule  HPI: Pt is a 70 y/o male c/o L hand a bump/nodule that he noticed 3-4 weeks ago. Pt locates nodule along the palmar aspect L 2nd MCP joint in the webspace. Pt notes the nodule is more annoying than it is painful.  Grip strength: no Numbness/tingling: no Aggravates: holding handle bare on spin bike, dumbbells Treatments tried: nothing  Pertinent review of Systems: No fevers or chills  Relevant historical information: No cancer history.   Objective:    Vitals:   05/03/21 1553  BP: 112/74  Pulse: 68  SpO2: 97%   General: Well Developed, well nourished, and in no acute distress.   MSK: Left hand small palpable nodule interdigital webspace between first and second digits left hand.  Nontender.  Normal hand motion and strength.  No other hand nodules or deformities present.  No Dupuytren's contracture lateral hand.  Lab and Radiology Results Procedure: Real-time Ultrasound Guided Injection of left hand palmar nodule Device: Philips Affiniti 50G Images permanently stored and available for review in PACS Ultrasound evaluation prior to injection reveals a small 2 x 5 mm solid nodule superficial to the abductor pollicis muscle.  No increased vascular flow. Verbal informed consent obtained.  Discussed risks and benefits of procedure. Warned about infection bleeding damage to structures skin hypopigmentation and fat atrophy among others. Patient expresses understanding and agreement Time-out conducted.   Noted no overlying erythema, induration, or other signs of local infection.   Skin prepped in a sterile fashion.   Local anesthesia: Topical Ethyl chloride.   With sterile technique and under real time ultrasound guidance: 20 mg of Kenalog and 0.5 mL of lidocaine injected into and around superficial nodule. Fluid seen entering the nodule and area around nodule.   Completed  without difficulty   Pain immediately resolved suggesting accurate placement of the medication.   Advised to call if fevers/chills, erythema, induration, drainage, or persistent bleeding.   Images permanently stored and available for review in the ultrasound unit.  Impression: Technically successful ultrasound guided injection.       Impression and Recommendations:    Assessment and Plan: 70 y.o. male with left hand palmar nodule.  Strongly suspicious for radial sided Dupuytren's contracture.  Specifically concern for involvement of the thenar cord.  Plan for injection today.  If not improving consider hand surgery referral.     PDMP not reviewed this encounter. Orders Placed This Encounter  Procedures   [place] LIMITED JOINT SPACE STRUCTURES UP LEFT(NO LINKED CHARGES)    Standing Status:   Future    Number of Occurrences:   1    Standing Expiration Date:   10/31/2021    Order Specific Question:   Reason for Exam (SYMPTOM  OR DIAGNOSIS REQUIRED)    Answer:   left hand pain    Order Specific Question:   Preferred imaging location?    Answer:   [place]   No orders of the defined types were placed in this encounter.   Discussed warning signs or symptoms. Please see discharge instructions. Patient expresses understanding.   The above documentation has been reviewed and is accurate and complete Lynne Leader, M.D.

## 2021-05-04 ENCOUNTER — Encounter: Payer: Self-pay | Admitting: Family Medicine

## 2021-05-04 DIAGNOSIS — M720 Palmar fascial fibromatosis [Dupuytren]: Secondary | ICD-10-CM | POA: Insufficient documentation

## 2021-05-15 ENCOUNTER — Other Ambulatory Visit (HOSPITAL_COMMUNITY): Payer: Self-pay

## 2021-05-15 MED FILL — Metoprolol Succinate Tab ER 24HR 25 MG (Tartrate Equiv): ORAL | 90 days supply | Qty: 90 | Fill #0 | Status: AC

## 2021-05-15 MED FILL — Aspirin Tab Delayed Release 81 MG: ORAL | 90 days supply | Qty: 90 | Fill #0 | Status: AC

## 2021-05-15 MED FILL — Losartan Potassium Tab 25 MG: ORAL | 90 days supply | Qty: 90 | Fill #0 | Status: AC

## 2021-05-15 MED FILL — Atorvastatin Calcium Tab 80 MG (Base Equivalent): ORAL | 90 days supply | Qty: 90 | Fill #0 | Status: AC

## 2021-05-16 ENCOUNTER — Other Ambulatory Visit (HOSPITAL_COMMUNITY): Payer: Self-pay

## 2021-05-31 ENCOUNTER — Other Ambulatory Visit (INDEPENDENT_AMBULATORY_CARE_PROVIDER_SITE_OTHER): Payer: Medicare Other

## 2021-05-31 ENCOUNTER — Other Ambulatory Visit: Payer: Self-pay

## 2021-05-31 DIAGNOSIS — R972 Elevated prostate specific antigen [PSA]: Secondary | ICD-10-CM | POA: Diagnosis not present

## 2021-05-31 LAB — PSA: PSA: 0.4 ng/mL (ref 0.10–4.00)

## 2021-06-06 ENCOUNTER — Telehealth (INDEPENDENT_AMBULATORY_CARE_PROVIDER_SITE_OTHER): Payer: Medicare Other | Admitting: Physician Assistant

## 2021-06-06 ENCOUNTER — Encounter: Payer: Self-pay | Admitting: Physician Assistant

## 2021-06-06 VITALS — HR 86 | Temp 100.3°F | Ht 67.0 in | Wt 167.0 lb

## 2021-06-06 DIAGNOSIS — U071 COVID-19: Secondary | ICD-10-CM

## 2021-06-06 MED ORDER — MOLNUPIRAVIR EUA 200MG CAPSULE: 4.0000 | ORAL_CAPSULE | Freq: Two times a day (BID) | ORAL | 0 refills | Status: AC

## 2021-06-06 NOTE — Progress Notes (Signed)
Virtual Visit via Video   I connected with Dale Gonzalez on 06/06/21 at  2:00 PM EDT by a video enabled telemedicine application and verified that I am speaking with the correct person using two identifiers. Location patient: Home Location provider: [facility], Office Persons participating in the virtual visit: Dale Gonzalez, [place] PA-C, Dale Pickler, LPN   I discussed the limitations of evaluation and management by telemedicine and the availability of in person appointments. The patient expressed understanding and agreed to proceed.  I acted as a Education administrator for [employer], PA-C [employer], LPN   Subjective:   HPI:   Patient is requesting evaluation for possible COVID-19.  Symptom onset: yesterday  Travel/contacts: pt's wife positive on Saturday.   Vaccination status: complete  Testing results: Home COVID test positive this morning.  Patient endorses the following symptoms: Headache, fatigue, fever 100.3, dry cough  Patient denies the following symptoms: Chest pain or SOB, diarrhea  Treatments tried: No medications Denies any history of COPD or uncontrolled asthma  Patient risk factors: Current OHYWV-37 risk of complications score: 6 Smoking status: Dale Gonzalez  reports that he has never smoked. He has never used smokeless tobacco. If male, currently pregnant? []   Yes []   No  ROS: See pertinent positives and negatives per HPI.  Patient Active Problem List   Diagnosis Date Noted   Dupuytren's contracture of left hand 05/04/2021   GERD (gastroesophageal reflux disease) 08/04/2019   Coronary artery disease s/p PTCA x 5 in 07/2017 10/10/2018   Elevated blood pressure reading without diagnosis of hypertension 05/18/2014   Nonspecific abnormal results of thyroid function study 01/12/2014   Exercise-induced asthma primarily cold induced 07/24/2013   Skin cancer, basal cell 12/27/2011   Microscopic hematuria 12/27/2011   Allergic rhinitis  12/19/2007   Dyslipidemia 12/18/2006    Social History   Tobacco Use   Smoking status: Never   Smokeless tobacco: Never  Substance Use Topics   Alcohol use: Yes    Alcohol/week: 1.0 standard drink    Types: 1 Shots of liquor per week    Comment: occ    Current Outpatient Medications:    aspirin 81 MG EC tablet, TAKE 1 TABLET (81 MG TOTAL) BY MOUTH DAILY. SWALLOW WHOLE., Disp: 90 tablet, Rfl: 3   atorvastatin (LIPITOR) 80 MG tablet, TAKE 1 TABLET (80 MG TOTAL) BY MOUTH DAILY., Disp: 90 tablet, Rfl: 3   EPINEPHrine 0.3 mg/0.3 mL IJ SOAJ injection, Inject 0.3 mg into the muscle as needed for anaphylaxis (Bee stings)., Disp: , Rfl:    losartan (COZAAR) 25 MG tablet, TAKE 1 TABLET (25 MG TOTAL) BY MOUTH DAILY., Disp: 90 tablet, Rfl: 3   metoprolol succinate (TOPROL-XL) 25 MG 24 hr tablet, TAKE 1 TABLET (25 MG TOTAL) BY MOUTH DAILY., Disp: 90 tablet, Rfl: 3   molnupiravir EUA (LAGEVRIO) 200 mg CAPS capsule, Take 4 capsules (800 mg total) by mouth 2 (two) times daily for 5 days., Disp: 40 capsule, Rfl: 0   nitroGLYCERIN (NITROSTAT) 0.4 MG SL tablet, PLACE 1 TABLET (0.4 MG TOTAL) UNDER THE TONGUE EVERY 5 (FIVE) MINUTES AS NEEDED FOR CHEST PAIN., Disp: 25 tablet, Rfl: 3  Allergies  Allergen Reactions   Bee Venom Anaphylaxis and Swelling    Swelling of the throat    Objective:   VITALS: Per patient if applicable, see vitals. GENERAL: Alert, appears well and in no acute distress. HEENT: Atraumatic, conjunctiva clear, no obvious abnormalities on inspection of external nose and ears.  NECK: Normal movements of the head and neck. CARDIOPULMONARY: No increased WOB. Speaking in clear sentences. I:E ratio WNL.  MS: Moves all visible extremities without noticeable abnormality. PSYCH: Pleasant and cooperative, well-groomed. Speech normal rate and rhythm. Affect is appropriate. Insight and judgement are appropriate. Attention is focused, linear, and appropriate.  NEURO: CN grossly intact. Oriented  as arrived to appointment on time with no prompting. Moves both UE equally.  SKIN: No obvious lesions, wounds, erythema, or cyanosis noted on face or hands.  Assessment and Plan:   Dale Gonzalez was seen today for covid positive.  Diagnoses and all orders for this visit:  COVID-19  Other orders -     molnupiravir EUA (LAGEVRIO) 200 mg CAPS capsule; Take 4 capsules (800 mg total) by mouth 2 (two) times daily for 5 days.   No red flags on discussion, patient is not in any obvious distress during our visit. Discussed progression of most viral illnesses, and recommended supportive care at this point in time.  We will start molnupiravir for COVID.  We did discuss possibly taking paxlovid instead however he would have to hold his statin while on this medication and after shared decision making he would prefer to not interrupt his statin.  Discussed over the counter supportive care options, including Tylenol 500 mg q 8 hours, with recommendations to push fluids and rest. Reviewed return precautions including new/worsening fever, SOB, new/worsening cough, sudden onset changes of symptoms. Recommended need to self-quarantine and practice social distancing until symptoms resolve. I recommend that patient follow-up if symptoms worsen or persist despite treatment x 7-10 days, sooner if needed.  I discussed the assessment and treatment plan with the patient. The patient was provided an opportunity to ask questions and all were answered. The patient agreed with the plan and demonstrated an understanding of the instructions.   The patient was advised to call back or seek an in-person evaluation if the symptoms worsen or if the condition fails to improve as anticipated.   CMA or LPN served as scribe during this visit. History, Physical, and Plan performed by medical provider. The above documentation has been reviewed and is accurate and complete.  [place], [place] 06/06/2021

## 2021-06-07 ENCOUNTER — Other Ambulatory Visit: Payer: Self-pay

## 2021-06-08 ENCOUNTER — Other Ambulatory Visit (HOSPITAL_COMMUNITY): Payer: Self-pay

## 2021-06-16 ENCOUNTER — Other Ambulatory Visit: Payer: Self-pay | Admitting: *Deleted

## 2021-06-16 ENCOUNTER — Ambulatory Visit (HOSPITAL_BASED_OUTPATIENT_CLINIC_OR_DEPARTMENT_OTHER)
Admission: RE | Admit: 2021-06-16 | Discharge: 2021-06-16 | Disposition: A | Discharge status: Home / Self Care | Payer: Medicare Other | Source: Ambulatory Visit | Attending: Family Medicine | Admitting: Family Medicine

## 2021-06-16 ENCOUNTER — Telehealth: Payer: Self-pay

## 2021-06-16 ENCOUNTER — Other Ambulatory Visit: Payer: Self-pay

## 2021-06-16 ENCOUNTER — Ambulatory Visit (INDEPENDENT_AMBULATORY_CARE_PROVIDER_SITE_OTHER): Payer: Medicare Other | Admitting: Family Medicine

## 2021-06-16 ENCOUNTER — Other Ambulatory Visit (HOSPITAL_COMMUNITY): Payer: Self-pay

## 2021-06-16 ENCOUNTER — Encounter (HOSPITAL_BASED_OUTPATIENT_CLINIC_OR_DEPARTMENT_OTHER): Payer: Self-pay

## 2021-06-16 VITALS — BP 132/78 | HR 57 | Temp 97.7°F | Ht 67.0 in | Wt 171.4 lb

## 2021-06-16 DIAGNOSIS — K5730 Diverticulosis of large intestine without perforation or abscess without bleeding: Secondary | ICD-10-CM | POA: Diagnosis not present

## 2021-06-16 DIAGNOSIS — R82998 Other abnormal findings in urine: Secondary | ICD-10-CM | POA: Diagnosis not present

## 2021-06-16 DIAGNOSIS — R3129 Other microscopic hematuria: Secondary | ICD-10-CM

## 2021-06-16 DIAGNOSIS — R319 Hematuria, unspecified: Secondary | ICD-10-CM | POA: Diagnosis not present

## 2021-06-16 DIAGNOSIS — R030 Elevated blood-pressure reading, without diagnosis of hypertension: Secondary | ICD-10-CM | POA: Diagnosis not present

## 2021-06-16 DIAGNOSIS — K4090 Unilateral inguinal hernia, without obstruction or gangrene, not specified as recurrent: Secondary | ICD-10-CM | POA: Diagnosis not present

## 2021-06-16 DIAGNOSIS — N200 Calculus of kidney: Secondary | ICD-10-CM | POA: Diagnosis not present

## 2021-06-16 LAB — COMPREHENSIVE METABOLIC PANEL
ALT: 23 U/L (ref 0–53)
AST: 19 U/L (ref 0–37)
Albumin: 4.2 g/dL (ref 3.5–5.2)
Alkaline Phosphatase: 56 U/L (ref 39–117)
BUN: 21 mg/dL (ref 6–23)
CO2: 32 mEq/L (ref 19–32)
Calcium: 9.2 mg/dL (ref 8.4–10.5)
Chloride: 104 mEq/L (ref 96–112)
Creatinine, Ser: 0.92 mg/dL (ref 0.40–1.50)
GFR: 83.94 mL/min (ref >60.00)
Glucose, Bld: 112 mg/dL — ABNORMAL HIGH (ref 70–99)
Potassium: 5 mEq/L (ref 3.5–5.1)
Sodium: 138 mEq/L (ref 135–145)
Total Bilirubin: 0.5 mg/dL (ref 0.2–1.2)
Total Protein: 6.4 g/dL (ref 6.0–8.3)

## 2021-06-16 LAB — CBC WITH DIFFERENTIAL/PLATELET
Basophils Absolute: 0 10*3/uL (ref 0.0–0.1)
Basophils Relative: 0.3 % (ref 0.0–3.0)
Eosinophils Absolute: 0.2 10*3/uL (ref 0.0–0.7)
Eosinophils Relative: 2.6 % (ref 0.0–5.0)
HCT: 42.1 % (ref 39.0–52.0)
Hemoglobin: 14.2 g/dL (ref 13.0–17.0)
Lymphocytes Relative: 25.9 % (ref 12.0–46.0)
Lymphs Abs: 1.5 10*3/uL (ref 0.7–4.0)
MCHC: 33.6 g/dL (ref 30.0–36.0)
MCV: 90.3 fl (ref 78.0–100.0)
Monocytes Absolute: 0.5 10*3/uL (ref 0.1–1.0)
Monocytes Relative: 9.1 % (ref 3.0–12.0)
Neutro Abs: 3.7 10*3/uL (ref 1.4–7.7)
Neutrophils Relative %: 62.1 % (ref 43.0–77.0)
Platelets: 241 10*3/uL (ref 150.0–400.0)
RBC: 4.66 Mil/uL (ref 4.22–5.81)
RDW: 13.5 % (ref 11.5–15.5)
WBC: 5.9 10*3/uL (ref 4.0–10.5)

## 2021-06-16 LAB — POCT URINALYSIS DIPSTICK
Bilirubin, UA: NEGATIVE
Blood, UA: POSITIVE
Glucose, UA: NEGATIVE
Ketones, UA: NEGATIVE
Nitrite, UA: NEGATIVE
Protein, UA: POSITIVE — AB
Spec Grav, UA: 1.025 (ref 1.010–1.025)
Urobilinogen, UA: NEGATIVE E.U./dL — AB
pH, UA: 6 (ref 5.0–8.0)

## 2021-06-16 LAB — URINALYSIS, ROUTINE W REFLEX MICROSCOPIC
Ketones, ur: NEGATIVE
Leukocytes,Ua: NEGATIVE
Nitrite: NEGATIVE
Specific Gravity, Urine: 1.025 (ref 1.000–1.030)
Urine Glucose: NEGATIVE
Urobilinogen, UA: 1 (ref 0.0–1.0)
pH: 5.5 (ref 5.0–8.0)

## 2021-06-16 MED ORDER — TAMSULOSIN HCL 0.4 MG PO CAPS
0.4000 mg | ORAL_CAPSULE | Freq: Every day | ORAL | 0 refills | Status: DC
Start: 2021-06-16 — End: 2021-06-16
  Filled 2021-06-16: qty 30, 30d supply, fill #0

## 2021-06-16 MED ORDER — IOHEXOL 300 MG/ML  SOLN
100.0000 mL | Freq: Once | INTRAMUSCULAR | Status: AC | PRN
  Administered 2021-06-16: 100 mL via INTRAVENOUS

## 2021-06-16 MED ORDER — TAMSULOSIN HCL 0.4 MG PO CAPS: 0.4000 mg | ORAL_CAPSULE | Freq: Every day | ORAL | 0 refills | Status: DC

## 2021-06-16 NOTE — Progress Notes (Addendum)
   Dale Gonzalez is a 70 y.o. male who presents today for an office visit.  Assessment/Plan:  New/Acute Problems: Painless Gross Hematuria Differential includes interstitial nephritis, ATN, or other intrinsic kidney issue.  Nephrolithiasis is also a consideration though less likely given lack of pain.  Does have known history of microscopic hematuria.  We will check stat c-Met and CBC.  Also check stat CT scan. May need referral back to urology or neprhology depending on results.   Chronic Problems Addressed Today: CAD / History of elevated BP reading without diagnosis of HTN On losartan which could could increase risk of AKI. Will check labs as above. BP is at goal today.  ADDENDUM: Ct scan shows nonobstructive nephrolithiasis.  This is the most likely source of his hematuria.  Given size this should pass within the next few weeks.  We will start Flomax and advised patient to start straining his urine.  He will let us know if symptoms do not improve over the next several weeks or if he develops any new or worsening symptoms.     Subjective:  HPI:  He states that 06/06/2021 he was diagnosed with covid, and he had a fever for 24 hours which resolved. During this time, he was also prescribed Molnupiravir.  He did well with this for several days.  Did not have any symptoms.  Yesterday he suddenly noticed changes in his urine color, where the coloration has been increasingly darkening without clear cause.  Symptoms have persisted throughout the day.  He denies pain, nausea, vomiting, or noticing blood clots in the urine.  Never had a thing like this in the past.  Does have a history of microscopic hematuria and has been seen by urology in the past.         Objective:  Physical Exam: BP 132/78   Pulse (!) 57   Temp 97.7 F (36.5 C) (Temporal)   Ht $R'5\' 7"'bN$  (1.702 m)   Wt 171 lb 6.4 oz (77.7 kg)   SpO2 98%   BMI 26.85 kg/m   Gen: No acute distress, resting comfortably CV: Regular rate  and rhythm with no murmurs appreciated Pulm: Normal work of breathing, clear to auscultation bilaterally with no crackles, wheezes, or rhonchi Neuro: Grossly normal, moves all extremities Psych: Normal affect and thought content      I,Jordan Kelly,acting as a scribe for Dimas Chyle, MD.,have documented all relevant documentation on the behalf of Dimas Chyle, MD,as directed by  Dimas Chyle, MD while in the presence of Dimas Chyle, MD.  I, Dimas Chyle, MD, have reviewed all documentation for this visit. The documentation on 06/16/21 for the exam, diagnosis, procedures, and orders are all accurate and complete.  Time Spent: 50 minutes of total time was spent on the date of the encounter performing the following actions: chart review prior to seeing the patient, obtaining history, performing a medically necessary exam, counseling on the treatment plan, reviewing his CT scan, coordinating care, placing orders, and documenting in our EHR.    [name]. Jerline Pain, MD 06/16/2021 10:45 AM

## 2021-06-16 NOTE — Telephone Encounter (Signed)
Pt was seen today.

## 2021-06-16 NOTE — Addendum Note (Signed)
Addended by: Vivi Barrack on: 06/16/2021 03:53 PM   Modules accepted: Level of Service

## 2021-06-16 NOTE — Progress Notes (Signed)
Please inform patient of the following:  CT scan shows kidney stone. Please see other result note.  [name]. Jerline Pain, MD 06/16/2021 3:37 PM

## 2021-06-16 NOTE — Telephone Encounter (Signed)
Glad he was able to be seen. Thankful Dr. Jerline Pain was kind enough to see him.

## 2021-06-16 NOTE — Progress Notes (Signed)
Please inform patient of the following:  We are still waiting on radiology to look at his CT scan but it looks like he probably has a kidney stone.  All of his blood work was normal. Recommend starting flomax 0.4mg  daily. The stone should pass on its own within a few weeks. Recommend he come in for a urine strainer. Would like for him to let us know if his symptoms are not improving or if he does not pass the stone within the next few weeks.

## 2021-06-16 NOTE — Telephone Encounter (Signed)
Patient Name: Dale Gonzalez Gender: Male DOB: 21-Jul-1951 Age: 69 Y 1 M 1 D Return Phone Number: [phone] (Primary) Address: City/ State/ Zip: [place]  [address] Client [facility] [place] [facility] Garret Reddish- MD Contact Type Call Who Is Calling Patient / Member / Family / Caregiver Call Type Triage / Clinical Relationship To Patient Self Return Phone Number [phone] (Primary) Chief Complaint Urine - unusual color Reason for Call Symptomatic / Request for [place] states his urine is very dark and has a red tint today. Translation No Nurse Assessment Nurse: Loletha Carrow, RN, Ronalee Belts Date/Time (Eastern Time): 06/16/2021 9:51:43 AM Confirm and document reason for call. If symptomatic, describe symptoms. ---Caller states post Covid X 10 days this week his urine is very dark and has a red tint today. Denies any other s/s Does the patient have any new or worsening symptoms? ---Yes Will a triage be completed? ---Yes Related visit to physician within the last 2 weeks? ---No Does the PT have any chronic conditions? (i.e. diabetes, asthma, this includes High risk factors for pregnancy, etc.) ---Yes List chronic conditions. ---CAD, HTN, chol, Is this a behavioral health or substance abuse call? ---No Guidelines Guideline Title Affirmed Question Affirmed Notes Nurse Date/Time (Eastern Time) Urine - Blood In [1] Pink or redcolored urine and likely from food (beets, rhubarb, red food dye) AND [2] lasts > 24 hours after stopping food Emch, RN, Ronalee Belts 06/16/2021 9:54:21 AM PLEASE NOTE: All timestamps contained within this report are represented as [place] Standard Time. CONFIDENTIALTY NOTICE: This fax transmission is intended only for the addressee. It contains information that is legally privileged, confidential or otherwise protected from use or  disclosure. If you are not the intended recipient, you are strictly prohibited from reviewing, disclosing, copying using or disseminating any of this information or taking any action in reliance on or regarding this information. If you have received this fax in error, please notify us immediately by telephone so that we can arrange for its return to [place]. Phone: [phone], Toll-Free: [phone], Fax: [phone] Page: 2 of 2 Call Id: [id number] [place]. Time Eilene Ghazi Time) Disposition Final User 06/16/2021 9:57:53 AM See PCP within 24 Hours Yes Emch, RN, Vicenta Dunning Disagree/Comply Comply Caller Understands Yes PreDisposition Did not know what to do Care Advice Given Per Guideline SEE PCP WITHIN 24 HOURS: * IF OFFICE WILL BE OPEN: You need to be examined within the next 24 hours. Call your doctor (or NP/PA) when the office opens and make an appointment. SAMPLE: * Bring in a sample of the bloody urine. * Keep it in the refrigerator until you leave. * Fever or pain occurs CALL BACK IF: * You become worse CARE ADVICE given per Urine, Blood In (Adult) guideline. Referrals REFERRED TO PCP OFFICE

## 2021-06-16 NOTE — Patient Instructions (Signed)
It was very nice to see you today!  We will check blood work and also get a CT scan to look for any possible causes of the blood in your urine.  You may have a kidney stone.  It also could be a medication side effect.  We will contact you this afternoon once we have more information.  Take care, Dr Jerline Pain  PLEASE NOTE:  If you had any lab tests please let us know if you have not heard back within a few days. You may see your results on mychart before we have a chance to review them but we will give you a call once they are reviewed by [place]. If we ordered any referrals today, please let us know if you have not heard from their office within the next week.   Please try these tips to maintain a healthy lifestyle:  Eat at least 3 REAL meals and 1-2 snacks per day.  Aim for no more than 5 hours between eating.  If you eat breakfast, please do so within one hour of getting up.   Each meal should contain half fruits/vegetables, one quarter protein, and one quarter carbs (no bigger than a computer mouse)  Cut down on sweet beverages. This includes juice, soda, and sweet tea.   Drink at least 1 glass of water with each meal and aim for at least 8 glasses per day  Exercise at least 150 minutes every week.

## 2021-06-17 LAB — URINE CULTURE
MICRO NUMBER:: 12565531
Result:: NO GROWTH
SPECIMEN QUALITY:: ADEQUATE

## 2021-06-19 NOTE — Progress Notes (Signed)
Please inform patient of the following:  Urine culture is negative for UTI.

## 2021-06-21 ENCOUNTER — Encounter: Payer: Self-pay | Admitting: Family Medicine

## 2021-06-21 ENCOUNTER — Telehealth: Payer: Self-pay

## 2021-06-21 NOTE — Telephone Encounter (Signed)
Appears orders are the same-unless there is a new order please let me know

## 2021-06-21 NOTE — Telephone Encounter (Signed)
Dale Gonzalez from [facility] called wanting to speak to a nurse. Pt came in on 10/28 to see Dr Jerline Pain. Dr Jerline Pain ordered a CT Scan on 10/28 and pt did the scan after his appt on 10/28. Dale Gonzalez called with another order for the CT Scan today 06/21/21. Dale Gonzalez would like to know if Dale Gonzalez needs another scan done. Dale Gonzalez can be reached at [phone]. Please Advise.

## 2021-06-22 NOTE — Telephone Encounter (Signed)
Tonie advised that order was a duplicate

## 2021-06-23 ENCOUNTER — Other Ambulatory Visit: Payer: Self-pay | Admitting: *Deleted

## 2021-06-23 ENCOUNTER — Other Ambulatory Visit: Payer: Self-pay

## 2021-06-23 ENCOUNTER — Other Ambulatory Visit (INDEPENDENT_AMBULATORY_CARE_PROVIDER_SITE_OTHER): Payer: Medicare Other

## 2021-06-23 DIAGNOSIS — R319 Hematuria, unspecified: Secondary | ICD-10-CM | POA: Diagnosis not present

## 2021-06-23 LAB — URINALYSIS, ROUTINE W REFLEX MICROSCOPIC
Bilirubin Urine: NEGATIVE
Ketones, ur: NEGATIVE
Leukocytes,Ua: NEGATIVE
Nitrite: NEGATIVE
Specific Gravity, Urine: 1.025 (ref 1.000–1.030)
Total Protein, Urine: NEGATIVE
Urine Glucose: NEGATIVE
Urobilinogen, UA: 0.2 (ref 0.0–1.0)
pH: 6 (ref 5.0–8.0)

## 2021-06-23 NOTE — Progress Notes (Signed)
Please inform patient of the following:  He still has blood in his urine but it is less than last time. Recommend we check again in 1-2 weeks. If blood has not cleared by then, then he will need referral to urology.

## 2021-06-23 NOTE — Telephone Encounter (Signed)
Lab order, patient aware  Appointment schedule

## 2021-07-05 ENCOUNTER — Other Ambulatory Visit (INDEPENDENT_AMBULATORY_CARE_PROVIDER_SITE_OTHER): Payer: Medicare Other

## 2021-07-05 ENCOUNTER — Other Ambulatory Visit: Payer: Self-pay

## 2021-07-05 DIAGNOSIS — R319 Hematuria, unspecified: Secondary | ICD-10-CM

## 2021-07-05 LAB — URINALYSIS, ROUTINE W REFLEX MICROSCOPIC
Bilirubin Urine: NEGATIVE
Ketones, ur: NEGATIVE
Leukocytes,Ua: NEGATIVE
Nitrite: NEGATIVE
Specific Gravity, Urine: 1.02 (ref 1.000–1.030)
Total Protein, Urine: NEGATIVE
Urine Glucose: NEGATIVE
Urobilinogen, UA: 0.2 (ref 0.0–1.0)
pH: 6 (ref 5.0–8.0)

## 2021-07-06 ENCOUNTER — Encounter: Payer: Self-pay | Admitting: Family Medicine

## 2021-07-06 NOTE — Progress Notes (Signed)
Please inform patient of the following:  He still has blood in his urine. Recommend urology referral.  [name]. Jerline Pain, MD 07/06/2021 8:39 AM

## 2021-07-10 ENCOUNTER — Other Ambulatory Visit: Payer: Self-pay | Admitting: *Deleted

## 2021-07-10 ENCOUNTER — Telehealth: Payer: Self-pay

## 2021-07-10 DIAGNOSIS — R319 Hematuria, unspecified: Secondary | ICD-10-CM

## 2021-07-10 NOTE — Telephone Encounter (Signed)
Referral placed for urology

## 2021-07-10 NOTE — Telephone Encounter (Signed)
Pt called regarding a referral. Pt sent a MyChart message on 11/17 and he is still having blood in his urine. Pt was recommended to have a Urology referral. Can pt have referral placed? Please Advise.

## 2021-07-27 DIAGNOSIS — N200 Calculus of kidney: Secondary | ICD-10-CM | POA: Diagnosis not present

## 2021-07-27 DIAGNOSIS — R310 Gross hematuria: Secondary | ICD-10-CM | POA: Diagnosis not present

## 2021-07-28 ENCOUNTER — Other Ambulatory Visit: Payer: Self-pay | Admitting: Urology

## 2021-08-10 NOTE — Progress Notes (Signed)
Patient to arrive at 1200 on 08/17/2021. History and medications reviewed. Pre-procedure instructions given. Denies any recent chest pain or SOB. NPO after 0800 day of procedure except for clear liquids until 1000. Driver secured.

## 2021-08-10 NOTE — Progress Notes (Signed)
Left message for patient to return call for ESWL instructions. 

## 2021-08-16 ENCOUNTER — Other Ambulatory Visit: Payer: Self-pay

## 2021-08-16 ENCOUNTER — Encounter: Payer: Self-pay | Admitting: Podiatry

## 2021-08-16 ENCOUNTER — Ambulatory Visit: Payer: Medicare Other | Admitting: Podiatry

## 2021-08-16 DIAGNOSIS — G5762 Lesion of plantar nerve, left lower limb: Secondary | ICD-10-CM | POA: Diagnosis not present

## 2021-08-16 DIAGNOSIS — L600 Ingrowing nail: Secondary | ICD-10-CM | POA: Diagnosis not present

## 2021-08-16 DIAGNOSIS — D3610 Benign neoplasm of peripheral nerves and autonomic nervous system, unspecified: Secondary | ICD-10-CM

## 2021-08-16 MED ORDER — TRIAMCINOLONE ACETONIDE 10 MG/ML IJ SUSP
10.0000 mg | Freq: Once | INTRAMUSCULAR | Status: AC
  Administered 2021-08-16: 10:00:00 10 mg

## 2021-08-16 NOTE — Progress Notes (Signed)
Subjective:   Patient ID: Dale Gonzalez, male   DOB: 70 y.o.   MRN: [id number]   HPI patient presents with discomfort within the third intermetatarsal space left which reoccurred over the last few months and also an ingrown toenail deformity left big toe that is been sore and he notes needs to be corrected   ROS      Objective:  Physical Exam  Neurovascular status intact with inflammation pain of the third interspace left with moderate radiating discomfort and positive Mulder sign and also an incurvated left hallux medial border painful when pressed making shoe gear difficult     Assessment:  Probability for neuroma symptomatology left along with ingrown toenail deformity of the hallux left medial border with pain     Plan:  H&P reviewed both conditions and for the ingrown I recommended correction of educating him on removal of the nail border.  Patient wants to do this but needs to wait till January today went ahead I did do sterile prep and I injected the third interspace with a combination of Xylocaine Marcaine dexamethasone Kenalog to reduce inflammation and discussed possible excision at 1 point in future

## 2021-08-17 ENCOUNTER — Encounter (HOSPITAL_BASED_OUTPATIENT_CLINIC_OR_DEPARTMENT_OTHER)
Admission: RE | Disposition: A | Discharge status: Home / Self Care | Payer: Self-pay | Source: Home / Self Care | Attending: Urology

## 2021-08-17 ENCOUNTER — Other Ambulatory Visit (HOSPITAL_COMMUNITY): Payer: Self-pay

## 2021-08-17 ENCOUNTER — Ambulatory Visit (HOSPITAL_BASED_OUTPATIENT_CLINIC_OR_DEPARTMENT_OTHER)
Admission: RE | Admit: 2021-08-17 | Discharge: 2021-08-17 | Disposition: A | Discharge status: Home / Self Care | Payer: Medicare Other | Attending: Urology | Admitting: Urology

## 2021-08-17 ENCOUNTER — Ambulatory Visit (HOSPITAL_COMMUNITY): Payer: Medicare Other

## 2021-08-17 ENCOUNTER — Other Ambulatory Visit: Payer: Self-pay

## 2021-08-17 ENCOUNTER — Encounter (HOSPITAL_BASED_OUTPATIENT_CLINIC_OR_DEPARTMENT_OTHER): Payer: Self-pay | Admitting: Urology

## 2021-08-17 DIAGNOSIS — N200 Calculus of kidney: Secondary | ICD-10-CM | POA: Insufficient documentation

## 2021-08-17 DIAGNOSIS — I878 Other specified disorders of veins: Secondary | ICD-10-CM | POA: Diagnosis not present

## 2021-08-17 HISTORY — PX: EXTRACORPOREAL SHOCK WAVE LITHOTRIPSY: SHX1557

## 2021-08-17 HISTORY — PX: LITHOTRIPSY: SUR834

## 2021-08-17 SURGERY — LITHOTRIPSY, ESWL
Anesthesia: LOCAL | Laterality: Left

## 2021-08-17 MED ORDER — SODIUM CHLORIDE 0.9 % IV SOLN: INTRAVENOUS | Status: DC

## 2021-08-17 MED ORDER — CIPROFLOXACIN HCL 500 MG PO TABS
ORAL_TABLET | ORAL | Status: AC
  Filled 2021-08-17: qty 1

## 2021-08-17 MED ORDER — CIPROFLOXACIN HCL 500 MG PO TABS
500.0000 mg | ORAL_TABLET | ORAL | Status: AC
  Administered 2021-08-17: 13:00:00 500 mg via ORAL

## 2021-08-17 MED ORDER — DIPHENHYDRAMINE HCL 25 MG PO CAPS
25.0000 mg | ORAL_CAPSULE | ORAL | Status: AC
  Administered 2021-08-17: 13:00:00 25 mg via ORAL

## 2021-08-17 MED ORDER — MORPHINE SULFATE (PF) 4 MG/ML IV SOLN
2.0000 mg | Freq: Once | INTRAVENOUS | Status: AC
  Administered 2021-08-17: 17:00:00 2 mg via INTRAVENOUS

## 2021-08-17 MED ORDER — MORPHINE SULFATE (PF) 2 MG/ML IV SOLN
INTRAVENOUS | Status: AC
  Filled 2021-08-17: qty 1

## 2021-08-17 MED ORDER — TAMSULOSIN HCL 0.4 MG PO CAPS
0.4000 mg | ORAL_CAPSULE | Freq: Every day | ORAL | 1 refills | Status: DC
  Filled 2021-08-17: qty 30, 30d supply, fill #0

## 2021-08-17 MED ORDER — DIAZEPAM 5 MG PO TABS
10.0000 mg | ORAL_TABLET | ORAL | Status: AC
  Administered 2021-08-17: 13:00:00 10 mg via ORAL

## 2021-08-17 MED ORDER — HYDROCODONE-ACETAMINOPHEN 5-325 MG PO TABS
1.0000 | ORAL_TABLET | ORAL | 0 refills | Status: DC | PRN
  Filled 2021-08-17: qty 20, 4d supply, fill #0

## 2021-08-17 MED ORDER — HYDROCODONE-ACETAMINOPHEN 5-325 MG PO TABS
ORAL_TABLET | ORAL | Status: AC
  Filled 2021-08-17: qty 1

## 2021-08-17 MED ORDER — DIAZEPAM 5 MG PO TABS
ORAL_TABLET | ORAL | Status: AC
  Filled 2021-08-17: qty 2

## 2021-08-17 MED ORDER — HYDROCODONE-ACETAMINOPHEN 5-325 MG PO TABS
1.0000 | ORAL_TABLET | Freq: Once | ORAL | Status: AC
  Administered 2021-08-17: 16:00:00 1 via ORAL

## 2021-08-17 MED ORDER — DIPHENHYDRAMINE HCL 25 MG PO CAPS
ORAL_CAPSULE | ORAL | Status: AC
  Filled 2021-08-17: qty 1

## 2021-08-17 NOTE — Discharge Instructions (Addendum)
See [facility] discharge instructions in chart.   Post Anesthesia Home Care Instructions  Activity: Get plenty of rest for the remainder of the day. A responsible individual must stay with you for 24 hours following the procedure.  For the next 24 hours, DO NOT: -Drive a car -Operate machinery -Drink alcoholic beverages -Take any medication unless instructed by your physician -Make any legal decisions or sign important papers.  Meals: Start with liquid foods such as gelatin or soup. Progress to regular foods as tolerated. Avoid greasy, spicy, heavy foods. If nausea and/or vomiting occur, drink only clear liquids until the nausea and/or vomiting subsides. Call your physician if vomiting continues.  Special Instructions/Symptoms: Your throat may feel dry or sore from the anesthesia or the breathing tube placed in your throat during surgery. If this causes discomfort, gargle with warm salt water. The discomfort should disappear within 24 hours.       

## 2021-08-17 NOTE — Op Note (Signed)
See [facility] OP note scanned into chart. Also because of the size, density, location and other factors that cannot be anticipated I feel this will likely be a staged procedure. This fact supersedes any indication in the scanned [facility] operative note to the contrary.  

## 2021-08-17 NOTE — H&P (Signed)
Please see HP scanned into Epic

## 2021-08-18 ENCOUNTER — Encounter (HOSPITAL_BASED_OUTPATIENT_CLINIC_OR_DEPARTMENT_OTHER): Payer: Self-pay | Admitting: Urology

## 2021-08-25 ENCOUNTER — Ambulatory Visit: Payer: Medicare Other | Admitting: Cardiology

## 2021-08-25 ENCOUNTER — Other Ambulatory Visit: Payer: Self-pay

## 2021-08-25 ENCOUNTER — Encounter: Payer: Self-pay | Admitting: Cardiology

## 2021-08-25 VITALS — BP 136/80 | HR 54 | Temp 97.8°F | Resp 17 | Ht 68.0 in | Wt 162.6 lb

## 2021-08-25 DIAGNOSIS — I2510 Atherosclerotic heart disease of native coronary artery without angina pectoris: Secondary | ICD-10-CM

## 2021-08-25 DIAGNOSIS — I10 Essential (primary) hypertension: Secondary | ICD-10-CM | POA: Insufficient documentation

## 2021-08-25 NOTE — Progress Notes (Signed)
Patient is here for follow up visit.  Subjective:   '@Patient'$  ID: Dale Gonzalez, male    DOB: 05/11/1951, 71 y.o.   MRN: [id number]   Chief Complaint  Patient presents with   Follow-up    1 YEAR    HPI  71 year old Caucasian with coronary artery disease (STEMI and staged multivessel PCI 07/2017).  Patient is doing very well. He exercises for an hour without any complaints of chest pain, shortness of breath, palpitations, leg edema, orthopnea, PND, TIA/syncope. Blood pressure generally in 120s/80s.   Current Outpatient Medications on File Prior to Visit  Medication Sig Dispense Refill   aspirin 81 MG EC tablet TAKE 1 TABLET (81 MG TOTAL) BY MOUTH DAILY. SWALLOW WHOLE. 90 tablet 3   atorvastatin (LIPITOR) 80 MG tablet TAKE 1 TABLET (80 MG TOTAL) BY MOUTH DAILY. 90 tablet 3   EPINEPHrine 0.3 mg/0.3 mL IJ SOAJ injection Inject 0.3 mg into the muscle as needed for anaphylaxis (Bee stings).     HYDROcodone-acetaminophen (NORCO/VICODIN) 5-325 MG tablet Take 1 tablet by mouth every 4 hours as needed for moderate pain. 20 tablet 0   losartan (COZAAR) 25 MG tablet TAKE 1 TABLET (25 MG TOTAL) BY MOUTH DAILY. 90 tablet 3   metoprolol succinate (TOPROL-XL) 25 MG 24 hr tablet TAKE 1 TABLET (25 MG TOTAL) BY MOUTH DAILY. 90 tablet 3   nitroGLYCERIN (NITROSTAT) 0.4 MG SL tablet PLACE 1 TABLET (0.4 MG TOTAL) UNDER THE TONGUE EVERY 5 (FIVE) MINUTES AS NEEDED FOR CHEST PAIN. 25 tablet 3   tamsulosin (FLOMAX) 0.4 MG CAPS capsule Take 1 capsule  by mouth at bedtime. 30 capsule 1   No current facility-administered medications on file prior to visit.    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 The patient performed treadmill exercise using Bruce protocol, completing 10:19 minutes. The patient completed an estimated workload of 12.3 METS, reaching 87% of the maximum predicted heart rate. Exercise capacity  was excellent. Hemodynamic response was normal. Stress symptoms included fatigue.  Stress electrocardiogram demonstrated normal sinus rhythm, normal resting conduction, old inferior infarct, no resting arrhythmias, and normal rest repolarization. No ischemic changes seen on stress electrocardiogram.  2. The overall quality of the study is good.  Left ventricular cavity is noted to be normal on the rest and stress studies.  Gated SPECT imaging demonstrates hypokinesis of the basal inferolateral and mid inferolateral myocardial wall(s).  The left ventricular ejection fraction was calculated or visually estimated to be 47%.  SPECT images show medium sized, mild intensity perfusion defect with minimal reversibility. Findings suggest old infarct in LCx territory with no significant ischemia.  3. Intermediate risk study.  Coronary intervention 08/08/2017:   Complex LAD/DIag bifurcation PCI Diag 2.5 X 16 mm Synergy DES LAD 2.75 X 38 mm & 3.5 X 20 mm Synergy DES Known residual moderate disease in small caliber RCA OM1 overlapping stents Synergy 2.25 x 38 mm and 2.25 x 12 mm (06/2017 in [facility] echocardiogram 08/09/2017:  - Left ventricle: The cavity size was normal. The estimated   ejection fraction was in the range of 45% to 50%. Mild   inferolateral hypokinesis. - Mitral valve: There was mild regurgitation. - Normal right atrial pressure.  Recent labs: 06/16/2021: Glucose 112, BUN/Cr 21/0.92. EGFR 83. Na/K 138/5.0. Rest of the CMP normal H/H 14/42. MCV 90. Platelets 241 Chol 109, TG 159, HDL 31, LDL 46 TSH 4.8  normal  04/28/2020: Glucose 92, BUN/Cr 18/0.91. EGFR 86. Na/K 139/4.1. Rest of the CMP normal H/H 14/43. MCV 91. Platelets 191 HbA1C 5.0% Chol 97, TG 99, HDL 30, LDL 49   03/2019: Glucose 87, BUN/Cr 19/0.9. EGFR 83. Na/K 138/4.2.  Chol 91, TG 98, HDL 31, LDL 40 Apolipoprotein B 45   Review of Systems  Cardiovascular:  Negative for chest pain, dyspnea on exertion, leg  swelling, palpitations and syncope.       Objective:    Vitals:   08/25/21 0924  BP: 136/80  Pulse: (!) 54  Resp: 17  Temp: 97.8 F (36.6 C)    Physical Exam Vitals and nursing note reviewed.  Constitutional:      Appearance: He is well-developed.  Neck:     Vascular: No JVD.  Cardiovascular:     Rate and Rhythm: Normal rate and regular rhythm.     Pulses: Intact distal pulses.     Heart sounds: Normal heart sounds. No murmur heard. Pulmonary:     Effort: Pulmonary effort is normal.     Breath sounds: Normal breath sounds. No wheezing or rales.        Assessment & Recommendations:   71 year-old Caucasian with coronary artery disease (STEMI and staged multivessel PCI 07/2017).  CAD: No angina symptoms with excellent baseline functional capacity. Continue Aspirin 81 mg daily. Continue losartan 25 mg daily.  Continue lipitor 80 mg daily. While his HDL chronically remains around 30, LDL is very well controlled in 40s.   Hypertension: Well controlled  F/u in 1 year  Nigel Mormon, MD [facility] Pager: [phone] Office: [phone] If no answer Cell [phone]

## 2021-08-28 ENCOUNTER — Other Ambulatory Visit: Payer: Self-pay | Admitting: Cardiology

## 2021-08-28 ENCOUNTER — Other Ambulatory Visit (HOSPITAL_COMMUNITY): Payer: Self-pay

## 2021-08-28 DIAGNOSIS — I2510 Atherosclerotic heart disease of native coronary artery without angina pectoris: Secondary | ICD-10-CM

## 2021-08-28 MED ORDER — LOSARTAN POTASSIUM 25 MG PO TABS
ORAL_TABLET | Freq: Every day | ORAL | 3 refills | Status: DC
  Filled 2021-08-28: qty 90, 90d supply, fill #0

## 2021-08-28 MED ORDER — METOPROLOL SUCCINATE ER 25 MG PO TB24
ORAL_TABLET | Freq: Every day | ORAL | 3 refills | Status: DC
  Filled 2021-08-28: qty 90, 90d supply, fill #0

## 2021-08-28 MED ORDER — ASPIRIN 81 MG PO TBEC
DELAYED_RELEASE_TABLET | Freq: Every day | ORAL | 3 refills | Status: DC
  Filled 2021-08-28: qty 120, 120d supply, fill #0

## 2021-08-28 MED ORDER — ATORVASTATIN CALCIUM 80 MG PO TABS
ORAL_TABLET | Freq: Every day | ORAL | 3 refills | Status: DC
  Filled 2021-08-28: qty 90, 90d supply, fill #0

## 2021-09-11 ENCOUNTER — Encounter: Payer: Self-pay | Admitting: Podiatry

## 2021-09-11 ENCOUNTER — Telehealth: Payer: Self-pay | Admitting: Urology

## 2021-09-11 ENCOUNTER — Ambulatory Visit: Payer: Medicare Other | Admitting: Podiatry

## 2021-09-11 ENCOUNTER — Other Ambulatory Visit: Payer: Self-pay

## 2021-09-11 DIAGNOSIS — L600 Ingrowing nail: Secondary | ICD-10-CM

## 2021-09-11 DIAGNOSIS — D3610 Benign neoplasm of peripheral nerves and autonomic nervous system, unspecified: Secondary | ICD-10-CM | POA: Diagnosis not present

## 2021-09-11 NOTE — Progress Notes (Signed)
Subjective:   Patient ID: Dale Gonzalez, male   DOB: 71 y.o.   MRN: [id number]   HPI Patient presents stating the area did not really improve on my left foot from the injection and I need to have this ingrown toenail fixed which has been chronic and its deformity neuro   ROS      Objective:  Physical Exam  Vascular status intact muscle strength found to be adequate with patient found to have incurvated medial border of the left hallux painful when pressed and shooting pains with positive Biagio Borg sign third interspace left that did not respond to medication     Assessment:  Ingrown toenail deformity left hallux medial border with strong probability for neuroma symptomatology third interspace left foot     Plan:  H&P reviewed both conditions and recommended correction of the ingrown toenail deformity explained procedure risk and allowing him to sign consent form.  I infiltrated the left hallux 60 mg Xylocaine Marcaine mixture left prep was done to the toe and using sterile instruments removed the medial border exposed matrix applied phenol 3 applications 30 seconds followed by alcohol lavage sterile dressing gave instructions on soaks then discussed neuroma and after discussion options he has decided on surgery and I allowed him to read a consent form for neurectomy explaining procedure risk and that this may or may not solve his problem.  Patient wants surgery and after extensive review signed consent form with all education given to patient at the current time

## 2021-09-11 NOTE — Patient Instructions (Signed)

## 2021-09-11 NOTE — Telephone Encounter (Signed)
DOS - 09/19/21  NEURECTOMY LEFT --- 83818  [employer] EFFECTIVE DATE  - 08/20/21   PLAN DEDUCTIBLE  - $0.00  OUT OF POCKET - $3,600.00 W/ $3,580.00 REMAINING COINSURANCE - 0% COPAY - $295.00  PER [employer] WEBSITE FOR CPT CODE 40375 Notification or Prior Authorization is not required for the requested services  Decision ID #:[id number]

## 2021-09-18 MED ORDER — HYDROCODONE-ACETAMINOPHEN 10-325 MG PO TABS
1.0000 | ORAL_TABLET | Freq: Three times a day (TID) | ORAL | 0 refills | Status: AC | PRN
Start: 2021-09-18 — End: 2021-09-23

## 2021-09-18 NOTE — Addendum Note (Signed)
Addended by: Wallene Huh on: 09/18/2021 02:28 PM   Modules accepted: Orders

## 2021-09-19 ENCOUNTER — Encounter: Payer: Self-pay | Admitting: Podiatry

## 2021-09-19 DIAGNOSIS — G5782 Other specified mononeuropathies of left lower limb: Secondary | ICD-10-CM | POA: Diagnosis not present

## 2021-09-19 DIAGNOSIS — G5762 Lesion of plantar nerve, left lower limb: Secondary | ICD-10-CM | POA: Diagnosis not present

## 2021-09-20 ENCOUNTER — Telehealth: Payer: Self-pay | Admitting: *Deleted

## 2021-09-20 NOTE — Telephone Encounter (Signed)
Patient calling for aftercare for postsurgery of his foot, said that one received was unclear.  Returned the call back to patient given aftercare instructions ,verbalized understanding.

## 2021-09-20 NOTE — Telephone Encounter (Signed)
Patient is having problems with his surgery foot, the material between the toes is cutting into the skin, how do I loosen up  without hurting toes and to stop this. Please advise.

## 2021-09-20 NOTE — Telephone Encounter (Signed)
Can carefully pull on the material to loosen between toes

## 2021-09-21 ENCOUNTER — Other Ambulatory Visit: Payer: Self-pay

## 2021-09-21 ENCOUNTER — Ambulatory Visit (INDEPENDENT_AMBULATORY_CARE_PROVIDER_SITE_OTHER): Payer: Medicare Other | Admitting: Podiatry

## 2021-09-21 DIAGNOSIS — D3610 Benign neoplasm of peripheral nerves and autonomic nervous system, unspecified: Secondary | ICD-10-CM

## 2021-09-21 NOTE — Progress Notes (Signed)
Subjective:   Patient ID: Dale Gonzalez, male   DOB: 71 y.o.   MRN: [id number]   HPI Patient presents stating he has bled through his initial dressing and he is concerned about some discomfort because it is starting to put pressure on his foot where he blacked   ROS      Objective:  Physical Exam  Neurovascular status intact negative Bevelyn Buckles' sign noted dressing does have dried blood through the dorsal surface but it is not fresh at the current time with moderate discomfort between the third and fourth toes where tension     Assessment:  Dressing which is tightened with normal amount of bleeding postoperatively     Plan:  I removed part of the dressing which relieved pressure keeping the underlying dressing on and then reapplied sterile dressing with Ace wrap.  Continue same treatment reappoint to recheck

## 2021-09-25 ENCOUNTER — Encounter: Payer: Self-pay | Admitting: Podiatry

## 2021-09-25 ENCOUNTER — Ambulatory Visit (INDEPENDENT_AMBULATORY_CARE_PROVIDER_SITE_OTHER): Payer: Medicare Other | Admitting: Podiatry

## 2021-09-25 ENCOUNTER — Other Ambulatory Visit: Payer: Self-pay

## 2021-09-25 DIAGNOSIS — D3610 Benign neoplasm of peripheral nerves and autonomic nervous system, unspecified: Secondary | ICD-10-CM

## 2021-09-27 NOTE — Progress Notes (Signed)
Subjective:   Patient ID: Dale Gonzalez, male   DOB: 71 y.o.   MRN: [id number]   HPI Patient states doing very well with surgery and is having minimal discomfort   ROS      Objective:  Physical Exam  Neurovascular status intact negative Bevelyn Buckles' sign noted patient's left third interspace healing well wound edges well coapted     Assessment:  Doing well neuroma excision third interspace left     Plan:  Went ahead today discussed continued elevation and immobilization compression and discussed gradual shoe gear usage starting in approximate 2 weeks and that recovery should take another 8 to 12 weeks

## 2021-09-28 DIAGNOSIS — N200 Calculus of kidney: Secondary | ICD-10-CM | POA: Diagnosis not present

## 2021-09-29 ENCOUNTER — Encounter: Payer: Medicare Other | Admitting: Family Medicine

## 2021-11-15 DIAGNOSIS — H1045 Other chronic allergic conjunctivitis: Secondary | ICD-10-CM | POA: Diagnosis not present

## 2021-11-16 DIAGNOSIS — D2271 Melanocytic nevi of right lower limb, including hip: Secondary | ICD-10-CM | POA: Diagnosis not present

## 2021-11-16 DIAGNOSIS — L570 Actinic keratosis: Secondary | ICD-10-CM | POA: Diagnosis not present

## 2021-11-16 DIAGNOSIS — C44622 Squamous cell carcinoma of skin of right upper limb, including shoulder: Secondary | ICD-10-CM | POA: Diagnosis not present

## 2021-11-16 DIAGNOSIS — Z85828 Personal history of other malignant neoplasm of skin: Secondary | ICD-10-CM | POA: Diagnosis not present

## 2021-11-16 DIAGNOSIS — L821 Other seborrheic keratosis: Secondary | ICD-10-CM | POA: Diagnosis not present

## 2021-11-16 DIAGNOSIS — L820 Inflamed seborrheic keratosis: Secondary | ICD-10-CM | POA: Diagnosis not present

## 2021-11-16 DIAGNOSIS — D692 Other nonthrombocytopenic purpura: Secondary | ICD-10-CM | POA: Diagnosis not present

## 2021-11-23 ENCOUNTER — Ambulatory Visit: Payer: Medicare Other | Admitting: Family Medicine

## 2021-11-23 ENCOUNTER — Encounter: Payer: Self-pay | Admitting: Family Medicine

## 2021-11-23 VITALS — BP 122/60 | HR 63 | Temp 98.0°F | Ht 68.0 in | Wt 170.0 lb

## 2021-11-23 DIAGNOSIS — M720 Palmar fascial fibromatosis [Dupuytren]: Secondary | ICD-10-CM

## 2021-11-23 DIAGNOSIS — M79641 Pain in right hand: Secondary | ICD-10-CM

## 2021-11-23 NOTE — Progress Notes (Signed)
?Phone [phone] ?In person visit ?  ?Subjective:  ? ?Dale Gonzalez is a 71 y.o. year old very pleasant male patient who presents for/with See problem oriented charting ?Chief Complaint  ?Patient presents with  ? right pinky pain  ?  Pt c/o pain to right pinky denies injury.  ? ?This visit occurred during the SARS-CoV-2 public health emergency.  Safety protocols were in place, including screening questions prior to the visit, additional usage of staff PPE, and extensive cleaning of exam room while observing appropriate contact time as indicated for disinfecting solutions.  ? ?Past Medical History-  ?Patient Active Problem List  ? Diagnosis Date Noted  ? Coronary artery disease s/p PTCA x 5 in 07/2017 10/10/2018  ?  Priority: High  ? Elevated blood pressure reading without diagnosis of hypertension 05/18/2014  ?  Priority: Medium   ? Nonspecific abnormal results of thyroid function study 01/12/2014  ?  Priority: Medium   ? Dyslipidemia 12/18/2006  ?  Priority: Medium   ? Exercise-induced asthma primarily cold induced 07/24/2013  ?  Priority: Low  ? Skin cancer, basal cell 12/27/2011  ?  Priority: Low  ? Microscopic hematuria 12/27/2011  ?  Priority: Low  ? Allergic rhinitis 12/19/2007  ?  Priority: Low  ? Dupuytren's contracture of left hand 05/04/2021  ? Hypercholesterolemia 08/25/2019  ? GERD (gastroesophageal reflux disease) 08/04/2019  ? ? ?Medications- reviewed and updated ?Current Outpatient Medications  ?Medication Sig Dispense Refill  ? aspirin 81 MG EC tablet TAKE 1 TABLET (81 MG TOTAL) BY MOUTH DAILY. SWALLOW WHOLE. 90 tablet 3  ? atorvastatin (LIPITOR) 80 MG tablet TAKE 1 TABLET (80 MG TOTAL) BY MOUTH DAILY. 90 tablet 3  ? EPINEPHrine 0.3 mg/0.3 mL IJ SOAJ injection Inject 0.3 mg into the muscle as needed for anaphylaxis (Bee stings).    ? losartan (COZAAR) 25 MG tablet TAKE 1 TABLET (25 MG TOTAL) BY MOUTH DAILY. 90 tablet 3  ? metoprolol succinate (TOPROL-XL) 25 MG 24 hr tablet TAKE 1 TABLET (25 MG  TOTAL) BY MOUTH DAILY. 90 tablet 3  ? nitroGLYCERIN (NITROSTAT) 0.4 MG SL tablet PLACE 1 TABLET (0.4 MG TOTAL) UNDER THE TONGUE EVERY 5 (FIVE) MINUTES AS NEEDED FOR CHEST PAIN. 25 tablet 3  ? ?No current facility-administered medications for this visit.  ? ?  ?Objective:  ?BP 122/60   Pulse 63   Temp 98 ?F (36.7 ?C)   Ht '5\' 8"'$  (1.727 m)   Wt 170 lb (77.1 kg)   SpO2 98%   BMI 25.85 kg/m?  ?Gen: NAD, resting comfortably ?CV: RRR no murmurs rubs or gallops ?Lungs: CTAB no crackles, wheeze, rhonchi ?Ext: no edema ?MSK: ?Right hand fifth finger has pain just proximal to MCP joint with palpation as well as over DIP joint.  Some restricted range of motion with opening and closing the hand-mildly reduced grip strength with fifth finger ?Appears to have thin subcutaneous band on the radial aspect of bilateral palms-possible Dupuytren's contracture ? ?  ? ?Assessment and Plan  ? ? ?#Several hand concerns ?S: Patient was seen in September of last year by Dr. Myrtie Hawk with Dupuytren's contracture of the left hand interestingly enough on the radial side-see my chart message with notes from Dr. Georgina Snell around that time-patient was having some discomfort at that time.  He received an ultrasound guided injection-patient reports no enduring improvement in pain/discomfort/annoyance ? ?6 to 8 weeks ago patient noted some pain in his right fifth finger and difficulty closing the hand-he thought  this may improve but simply has not.  Has a constant 1 out of 10 pain but with shaking hands can be up to 6 or 7 out of 10.  No catching or locking sensation such as typical trigger finger. ?- He also noted a contracture on the right side similar to the one previously noted on the left for 6 weeks ago on the radial side ?A/P: Patient with bilateral what appears to be Dupuytren contracture of radial side-I was honest with patient I have not seen this distribution previously.  With lack of improvement with injection in the past by Dr.  Georgina Snell we opted to get hand surgeons opinion-refer to Dr. Amedeo Plenty of [facility] ? ?He is also having rather significant pain in his fifth finger-there are elements of this that remind me of trigger finger but I am not confident of this diagnosis-we are going to get Dr. Vanetta Shawl expert opinion.  He has been trying biking gloves when he is working around the house and I think that is reasonable for padding purposes ? ?Of note if patient does need surgery he is easily able to complete 4 METS of activity without chest pain or shortness of breath-would not need further cardiac work-up.  Blood work in October was stable-I do not think he would require repeat labs ?  ? ?Recommended follow up: Check back in at June visit or sooner if needed ?Future Appointments  ?Date Time Provider [place]  ?01/26/2022  8:00 AM Marin Olp, MD [facility]  ?04/09/2022  8:00 AM [facility] HEALTH COACH [facility]  ?08/30/2022  9:45 AM Patwardhan, Reynold Bowen, MD [facility] None  ? ?Lab/Order associations: ?  ICD-10-CM   ?1. Dupuytren's disease of palm of both hands  M72.0 Ambulatory referral to Hand Surgery  ?  ?2. Right hand pain  M79.641 Ambulatory referral to Hand Surgery  ?  ? ? Time Spent: ?23 minutes of total time (11:38 AM- 12:01 PM) was spent on the date of the encounter performing the following actions: chart review prior to seeing the patient, obtaining history, performing a medically necessary exam, counseling on the treatment plan, placing orders, and documenting in our EHR.   ? ?Return precautions advised.  ?Garret Reddish, MD ? ?

## 2021-11-23 NOTE — Patient Instructions (Addendum)
We will call you within two weeks about your referral to [facility] Dr. Amedeo Plenty- hand surgeon. If you do not hear within 2 weeks, give [place] a call or can call [facility] directly [phone] ? ?Recommended follow up: Return for as needed for new, worsening, persistent symptoms. ? ?

## 2021-11-30 ENCOUNTER — Other Ambulatory Visit (HOSPITAL_COMMUNITY): Payer: Self-pay

## 2021-12-12 ENCOUNTER — Ambulatory Visit: Payer: Medicare Other | Admitting: Family Medicine

## 2022-01-22 DIAGNOSIS — M79642 Pain in left hand: Secondary | ICD-10-CM | POA: Diagnosis not present

## 2022-01-22 DIAGNOSIS — M79641 Pain in right hand: Secondary | ICD-10-CM | POA: Diagnosis not present

## 2022-01-22 DIAGNOSIS — M720 Palmar fascial fibromatosis [Dupuytren]: Secondary | ICD-10-CM | POA: Diagnosis not present

## 2022-01-22 DIAGNOSIS — M13849 Other specified arthritis, unspecified hand: Secondary | ICD-10-CM | POA: Diagnosis not present

## 2022-01-25 ENCOUNTER — Encounter: Payer: Medicare Other | Admitting: Family Medicine

## 2022-01-26 ENCOUNTER — Encounter: Payer: Self-pay | Admitting: Family Medicine

## 2022-01-26 ENCOUNTER — Ambulatory Visit (INDEPENDENT_AMBULATORY_CARE_PROVIDER_SITE_OTHER): Payer: Medicare Other | Admitting: Family Medicine

## 2022-01-26 ENCOUNTER — Encounter: Payer: Self-pay | Admitting: Gastroenterology

## 2022-01-26 VITALS — BP 130/70 | HR 47 | Temp 98.2°F | Ht 68.0 in | Wt 167.2 lb

## 2022-01-26 DIAGNOSIS — R351 Nocturia: Secondary | ICD-10-CM | POA: Diagnosis not present

## 2022-01-26 DIAGNOSIS — Z87448 Personal history of other diseases of urinary system: Secondary | ICD-10-CM

## 2022-01-26 DIAGNOSIS — Z1211 Encounter for screening for malignant neoplasm of colon: Secondary | ICD-10-CM | POA: Diagnosis not present

## 2022-01-26 DIAGNOSIS — E785 Hyperlipidemia, unspecified: Secondary | ICD-10-CM | POA: Diagnosis not present

## 2022-01-26 DIAGNOSIS — Z0000 Encounter for general adult medical examination without abnormal findings: Secondary | ICD-10-CM | POA: Diagnosis not present

## 2022-01-26 DIAGNOSIS — I2510 Atherosclerotic heart disease of native coronary artery without angina pectoris: Secondary | ICD-10-CM

## 2022-01-26 LAB — CBC WITH DIFFERENTIAL/PLATELET
Basophils Absolute: 0 10*3/uL (ref 0.0–0.1)
Basophils Relative: 0.6 % (ref 0.0–3.0)
Eosinophils Absolute: 0.1 10*3/uL (ref 0.0–0.7)
Eosinophils Relative: 2.3 % (ref 0.0–5.0)
HCT: 43.2 % (ref 39.0–52.0)
Hemoglobin: 14.5 g/dL (ref 13.0–17.0)
Lymphocytes Relative: 28.9 % (ref 12.0–46.0)
Lymphs Abs: 1.5 10*3/uL (ref 0.7–4.0)
MCHC: 33.6 g/dL (ref 30.0–36.0)
MCV: 90.2 fl (ref 78.0–100.0)
Monocytes Absolute: 0.5 10*3/uL (ref 0.1–1.0)
Monocytes Relative: 9.4 % (ref 3.0–12.0)
Neutro Abs: 3.1 10*3/uL (ref 1.4–7.7)
Neutrophils Relative %: 58.8 % (ref 43.0–77.0)
Platelets: 199 10*3/uL (ref 150.0–400.0)
RBC: 4.79 Mil/uL (ref 4.22–5.81)
RDW: 13.2 % (ref 11.5–15.5)
WBC: 5.2 10*3/uL (ref 4.0–10.5)

## 2022-01-26 LAB — LIPID PANEL
Cholesterol: 94 mg/dL (ref 0–200)
HDL: 35.6 mg/dL — ABNORMAL LOW (ref >39.00)
LDL Cholesterol: 41 mg/dL (ref 0–99)
NonHDL: 58.25
Total CHOL/HDL Ratio: 3
Triglycerides: 84 mg/dL (ref 0.0–149.0)
VLDL: 16.8 mg/dL (ref 0.0–40.0)

## 2022-01-26 LAB — COMPREHENSIVE METABOLIC PANEL
ALT: 18 U/L (ref 0–53)
AST: 19 U/L (ref 0–37)
Albumin: 4.3 g/dL (ref 3.5–5.2)
Alkaline Phosphatase: 46 U/L (ref 39–117)
BUN: 18 mg/dL (ref 6–23)
CO2: 29 mEq/L (ref 19–32)
Calcium: 9.2 mg/dL (ref 8.4–10.5)
Chloride: 103 mEq/L (ref 96–112)
Creatinine, Ser: 0.85 mg/dL (ref 0.40–1.50)
GFR: 87.31 mL/min (ref >60.00)
Glucose, Bld: 92 mg/dL (ref 70–99)
Potassium: 4.4 mEq/L (ref 3.5–5.1)
Sodium: 138 mEq/L (ref 135–145)
Total Bilirubin: 0.7 mg/dL (ref 0.2–1.2)
Total Protein: 6.4 g/dL (ref 6.0–8.3)

## 2022-01-26 LAB — URINALYSIS, MICROSCOPIC ONLY

## 2022-01-26 LAB — PSA: PSA: 0.5 ng/mL (ref 0.10–4.00)

## 2022-01-26 MED ORDER — NITROGLYCERIN 0.4 MG SL SUBL: SUBLINGUAL_TABLET | SUBLINGUAL | 3 refills | Status: DC | PRN

## 2022-01-26 MED ORDER — EPINEPHRINE 0.3 MG/0.3ML IJ SOAJ: 0.3000 mg | INTRAMUSCULAR | 5 refills | Status: DC | PRN

## 2022-01-26 NOTE — Progress Notes (Signed)
Phone: [phone]   Subjective:  Patient presents today for their annual physical. Chief complaint-noted.   See problem oriented charting- ROS- full  review of systems was completed and negative  except for: runny nose, sneezing, seasonal allergies   The following were reviewed and entered/updated in epic: Past Medical History:  Diagnosis Date   Basal cell cancer    right cheek; ?left arm; Dr Wilhemina Bonito   Chicken pox    Coronary artery disease    Hyperlipidemia    LDL goal = <115   Myocardial infarction ([place]) 07/27/2017   RAD (reactive airway disease) 1998   cold induced cough @ [place]   Seasonal allergies    Patient Active Problem List   Diagnosis Date Noted   Coronary artery disease s/p PTCA x 5 in 07/2017 10/10/2018    Priority: High   Dupuytren's contracture of left hand 05/04/2021    Priority: Medium    Elevated blood pressure reading without diagnosis of hypertension 05/18/2014    Priority: Medium    Nonspecific abnormal results of thyroid function study 01/12/2014    Priority: Medium    Dyslipidemia 12/18/2006    Priority: Medium    GERD (gastroesophageal reflux disease) 08/04/2019    Priority: Low   Exercise-induced asthma primarily cold induced 07/24/2013    Priority: Low   Skin cancer, basal cell 12/27/2011    Priority: Low   Microscopic hematuria 12/27/2011    Priority: Low   Allergic rhinitis 12/19/2007    Priority: Low   Past Surgical History:  Procedure Laterality Date   COLONOSCOPY  2003 & 2013   negative; Dr Sharlett Iles   CORONARY ANGIOPLASTY WITH STENT PLACEMENT  07/27/2017; 08/08/2017   CORONARY STENT INTERVENTION N/A 08/08/2017   Procedure: CORONARY STENT INTERVENTION;  Surgeon: Nigel Mormon, MD;  Location: [facility];  Service: Cardiovascular;  Laterality: N/A;   EXTRACORPOREAL SHOCK WAVE LITHOTRIPSY Left 08/17/2021   Procedure: LEFT EXTRACORPOREAL SHOCK WAVE LITHOTRIPSY (ESWL);  Surgeon: Robley Fries, MD;  Location:  [facility];  Service: Urology;  Laterality: Left;   EYE SURGERY     right eye lid  dropping procedure 9/21   eyelid surgery     bilateral- did not work well for right eyelid    INTRAVASCULAR ULTRASOUND/IVUS N/A 08/08/2017   Procedure: Intravascular Ultrasound/IVUS;  Surgeon: Nigel Mormon, MD;  Location: [place] [facility];  Service: Cardiovascular;  Laterality: N/A;   LEFT HEART CATH AND CORONARY ANGIOGRAPHY N/A 08/08/2017   Procedure: LEFT HEART CATH AND CORONARY ANGIOGRAPHY;  Surgeon: Nigel Mormon, MD;  Location: [place] [facility];  Service: Cardiovascular;  Laterality: N/A;   LITHOTRIPSY  08/17/2021   MOHS SURGERY     "right face; ?left arm"   ROOT CANAL  01/04/2020   Bilateral   TONSILLECTOMY     WRIST GANGLION EXCISION Left    Dr Wonda Olds    Family History  Problem Relation Age of Onset   Cancer Mother         cervical lymph nodes. mother has not shared primary cancer with him.    Heart disease Mother    Hypertension Mother    Hypertension Father    Heart disease Father        bypass surgery   Heart attack Father 44       Colostomy for ? diagnosis   COPD Father    Cancer Father        was not shared with patient primary  cancer.    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    Colon cancer Neg Hx    Esophageal cancer Neg Hx    Stomach cancer Neg Hx    Rectal cancer Neg Hx    Stroke Neg Hx    Diabetes Neg Hx     Medications- reviewed and updated Current Outpatient Medications  Medication Sig Dispense Refill   aspirin 81 MG EC tablet TAKE 1 TABLET (81 MG TOTAL) BY MOUTH DAILY. SWALLOW WHOLE. 90 tablet 3   atorvastatin (LIPITOR) 80 MG tablet TAKE 1 TABLET (80 MG TOTAL) BY MOUTH DAILY. 90 tablet 3   EPINEPHrine 0.3 mg/0.3 mL IJ SOAJ injection Inject 0.3 mg into the muscle as needed for  anaphylaxis (Bee stings). 2 each 5   losartan (COZAAR) 25 MG tablet TAKE 1 TABLET (25 MG TOTAL) BY MOUTH DAILY. 90 tablet 3   metoprolol succinate (TOPROL-XL) 25 MG 24 hr tablet TAKE 1 TABLET (25 MG TOTAL) BY MOUTH DAILY. 90 tablet 3   nitroGLYCERIN (NITROSTAT) 0.4 MG SL tablet PLACE 1 TABLET (0.4 MG TOTAL) UNDER THE TONGUE EVERY 5 (FIVE) MINUTES AS NEEDED FOR CHEST PAIN. 25 tablet 3   No current facility-administered medications for this visit.    Allergies-reviewed and updated Allergies  Allergen Reactions   Bee Venom Anaphylaxis and Swelling    Swelling of the throat    Social History   Social History Narrative   Married 45 years in 2021.  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    Works part time for his prior business- may stop in 2021- about 10 hours a week      Hobbies: enjoys spending time with his grandkids, regular exercise. Active in his church- [employer].    Objective  Objective:  BP 130/70   Pulse (!) 47   Temp 98.2 F (36.8 C)   Ht '5\' 8"'$  (1.727 m)   Wt 167 lb 3.2 oz (75.8 kg)   SpO2 98%   BMI 25.42 kg/m  Gen: NAD, resting comfortably HEENT: Mucous membranes are moist. Oropharynx normal Neck: no thyromegaly CV: RRR no murmurs rubs or gallops Lungs: CTAB no crackles, wheeze, rhonchi Abdomen: soft/nontender/nondistended/normal bowel sounds. No rebound or guarding.  Ext: no edema Skin: warm, dry Neuro: grossly normal, moves all extremities, PERRLA    Assessment and Plan  71 y.o. male presenting for annual physical.  Health Maintenance counseling: 1. Anticipatory guidance: Patient counseled regarding regular dental exams -q4 months, eye exams -yearly,  avoiding smoking and second hand smoke , limiting alcohol to 2 beverages per day- 2 per month maybe , no illicit drugs.   2. Risk factor reduction:  Advised patient of  need for regular exercise and diet rich and fruits and vegetables to reduce risk of heart attack and stroke.  Exercise- 6 to 7 days/week.  Diet/weight management-excellent job with weight maintenance-continues to eat reasonably healthy.  Wt Readings from Last 3 Encounters:  01/26/22 167 lb 3.2 oz (75.8 kg)  11/23/21 170 lb (77.1 kg)  08/25/21 162 lb 9.6 oz (73.8 kg)  3. Immunizations/screenings/ancillary studies-discussed bivalent booster- thinks he had this - but will check  Immunization History  Administered Date(s) Administered   Fluad Quad(high Dose 65+) 05/17/2020, 05/01/2021   Influenza Whole 07/08/2007, 05/20/2008, 08/04/2008, 06/01/2010   Influenza,inj,Quad PF,6+ Mos 05/18/2014   Influenza-Unspecified 07/01/2017, 04/08/2019   PFIZER Comirnaty(Gray Top)Covid-19 Tri-Sucrose Vaccine 02/16/2021   PFIZER(Purple Top)SARS-COV-2 Vaccination 09/05/2019, 09/23/2019, 05/11/2020   PNEUMOCOCCAL CONJUGATE-20 05/01/2021   Pneumococcal Conjugate-13 08/02/2015, 03/27/2016   Td 12/24/2008   Tdap 02/18/2014   Zoster Recombinat (Shingrix) 07/15/2017, 10/17/2017   Zoster, Live 01/04/2011   4. Prostate cancer screening- low risk prior PSA trend-did have temporary not in September of last year and offered repeat today.  Occasional nocturia Lab Results  Component Value Date   PSA 0.40 05/31/2021   PSA 1.48 05/01/2021   PSA 0.6 04/28/2020   5. Colon cancer screening - due for 10-year repeat-number was given to call GI and referral placed  6. Skin cancer screening- Dr. Wilhemina Bonito yearly - history of skin cancer. advised regular sunscreen use. Denies worrisome, changing, or new skin lesions.  7. Smoking associated screening (lung cancer screening, AAA screen 65-75, UA)-never smoker-no screening needed 8. STD screening -not needed as monogamous  Status of chronic or acute concerns   #Cad- follows with Dr. Nadene Rubins of [facility] times 12/29/2016 #hyperlipidemia S: Medication:aspirin 81 mg,  atorvastatin 80 mg, losartan 25 mg, metoprolol 25 mg XR (losartan and metoprolol were started post MI) -nitroglycerin on hand- frequency of use: one time a few years ago  -Most recently seen by cardiology on August 25, 2021 Lab Results  Component Value Date   CHOL 109 05/01/2021   HDL 31.50 (L) 05/01/2021   LDLCALC 46 05/01/2021   TRIG 159.0 (H) 05/01/2021   CHOLHDL 3 05/01/2021  A/P: CAD asymptomatic. Lipids well controlled- continue current meds  #Exercise-induced asthma primarily cold-induced-no recent issues, masking cold weather is helpful   #Dupuytren contracture bilaterally-has seen both Dr. Georgina Snell and Dr. Amedeo Plenty (2023) in the past  -Also has some arthritis right 5th finger- possible fusion in future- trying bandaid at first  #History of hematuria-has seen Dr. Amalia Hailey in the past-we will check urine microscopic  From Dr. Linna Darner notes "Urinalysis  reveals trace hematuria. In 2012 it revealed small to moderate microscopic hematuria. This was evaluated in the past  by Dr. Amalia Hailey, Urologist. It is most likely due to the number of hours he bikes which is 5-6 hours per week on average."   #History of elevated TSH in the past-normal in September and offered repeat today- will hold until next visit  Lab Results  Component Value Date   TSH 4.82 05/01/2021   #Bee sting allergy - update epi pen refill today  Recommended follow up: Return in about 1 year (around 01/27/2023) for physical or sooner if needed.Schedule b4 you leave. Future Appointments  Date Time Provider [place]  04/09/2022  8:00 AM [facility] HEALTH COACH [facility] [place]  08/30/2022  9:45 AM Patwardhan, Reynold Bowen, MD [facility] None   Lab/Order associations: fasting   ICD-10-CM   1. Preventative health care  Z00.00     2. History of hematuria  Z87.448 Urine Microscopic    3. Coronary artery disease s/p PTCA x 5 in 07/2017  I25.10     4. Dyslipidemia  E78.5 CBC with Differential/Platelet    Comprehensive metabolic panel     Lipid panel    5. Nocturia  R35.1 PSA    6. Coronary artery disease involving native coronary artery of native heart without angina pectoris  I25.10 nitroGLYCERIN (NITROSTAT) 0.4 MG SL tablet    7. Screen  for colon cancer  Z12.11 Ambulatory referral to Gastroenterology      Meds ordered this encounter  Medications   nitroGLYCERIN (NITROSTAT) 0.4 MG SL tablet    Sig: PLACE 1 TABLET (0.4 MG TOTAL) UNDER THE TONGUE EVERY 5 (FIVE) MINUTES AS NEEDED FOR CHEST PAIN.    Dispense:  25 tablet    Refill:  3   EPINEPHrine 0.3 mg/0.3 mL IJ SOAJ injection    Sig: Inject 0.3 mg into the muscle as needed for anaphylaxis (Bee stings).    Dispense:  2 each    Refill:  5    Return precautions advised.  Garret Reddish, MD

## 2022-01-26 NOTE — Patient Instructions (Addendum)
[facility] contact Please call to schedule visit and/or procedure Address: [address], [place], [place] [address] Phone: [phone]   Please stop by lab before you go If you have mychart- we will send your results within 3 business days of [place] receiving them.  If you do not have mychart- we will call you about results within 5 business days of [place] receiving them.  *please also note that you will see labs on mychart as soon as they post. I will later go in and write notes on them- will say "notes from Dr. Yong Channel"   Recommended follow up: Return in about 1 year (around 01/27/2023) for physical or sooner if needed.Schedule b4 you leave.   Preferred mychart only for this summary

## 2022-01-29 ENCOUNTER — Telehealth: Payer: Self-pay | Admitting: *Deleted

## 2022-01-29 NOTE — Telephone Encounter (Signed)
Called pt- [facility] to verify if he is OFF Brilinta- he has a a script in his med list sent 11-16-2021  Lelan Pons PV

## 2022-01-30 NOTE — Telephone Encounter (Signed)
Attempted to reach pt to verify he is off Brilinta- no answer- no voice mail to LM today  marie PV

## 2022-02-19 DIAGNOSIS — H35033 Hypertensive retinopathy, bilateral: Secondary | ICD-10-CM | POA: Diagnosis not present

## 2022-02-19 DIAGNOSIS — H524 Presbyopia: Secondary | ICD-10-CM | POA: Diagnosis not present

## 2022-02-21 NOTE — Telephone Encounter (Signed)
Called patient again, no answer. Left a message for the pt to return my call.

## 2022-02-27 ENCOUNTER — Ambulatory Visit (AMBULATORY_SURGERY_CENTER): Payer: Self-pay | Admitting: *Deleted

## 2022-02-27 VITALS — Ht 67.0 in | Wt 176.0 lb

## 2022-02-27 DIAGNOSIS — Z1211 Encounter for screening for malignant neoplasm of colon: Secondary | ICD-10-CM

## 2022-02-27 MED ORDER — NA SULFATE-K SULFATE-MG SULF 17.5-3.13-1.6 GM/177ML PO SOLN: 1.0000 | Freq: Once | ORAL | 0 refills | Status: AC

## 2022-02-27 NOTE — Progress Notes (Signed)
No egg or soy allergy known to patient  No issues known to pt with past sedation with any surgeries or procedures Patient denies ever being told they had issues or difficulty with intubation  No FH of Malignant Hyperthermia Pt is not on diet pills Pt is not on  home 02  Pt is not on blood thinners  Pt denies issues with constipation  No A fib or A flutter Have any cardiac testing pending--pt denies  Pt instructed to use Singlecare.com or GoodRx for a price reduction on prep

## 2022-03-22 ENCOUNTER — Encounter: Payer: Self-pay | Admitting: Gastroenterology

## 2022-03-27 ENCOUNTER — Ambulatory Visit (AMBULATORY_SURGERY_CENTER): Payer: Medicare Other | Admitting: Gastroenterology

## 2022-03-27 ENCOUNTER — Encounter: Payer: Self-pay | Admitting: Gastroenterology

## 2022-03-27 VITALS — BP 151/84 | HR 51 | Temp 98.4°F | Resp 16 | Ht 68.0 in | Wt 173.0 lb

## 2022-03-27 DIAGNOSIS — Z1211 Encounter for screening for malignant neoplasm of colon: Secondary | ICD-10-CM

## 2022-03-27 DIAGNOSIS — D123 Benign neoplasm of transverse colon: Secondary | ICD-10-CM

## 2022-03-27 MED ORDER — SODIUM CHLORIDE 0.9 % IV SOLN: 500.0000 mL | Freq: Once | INTRAVENOUS | Status: DC

## 2022-03-27 NOTE — Progress Notes (Signed)
Report to PACU, RN, vss, BBS= Clear.  

## 2022-03-27 NOTE — Progress Notes (Signed)
Pt's states no medical or surgical changes since previsit or office visit. 

## 2022-03-27 NOTE — Op Note (Signed)
[facility] Patient Name: Dale Gonzalez Procedure Date: 03/27/2022 7:32 AM MRN: [id number] Endoscopist: Thornton Park MD, MD Age: 71 Referring MD:  Date of Birth: 22-Sep-1950 Gender: Male Account #: [id number] Procedure:                Colonoscopy Indications:              Screening for colorectal malignant neoplasm                           Normal colonoscopies in 2003 and 2013 Medicines:                Monitored Anesthesia Care Procedure:                Pre-Anesthesia Assessment:                           - Prior to the procedure, a History and Physical                            was performed, and patient medications and                            allergies were reviewed. The patient's tolerance of                            previous anesthesia was also reviewed. The risks                            and benefits of the procedure and the sedation                            options and risks were discussed with the patient.                            All questions were answered, and informed consent                            was obtained. Prior Anticoagulants: The patient has                            taken no previous anticoagulant or antiplatelet                            agents. ASA Grade Assessment: II - A patient with                            mild systemic disease. After reviewing the risks                            and benefits, the patient was deemed in                            satisfactory condition to undergo the procedure.  After obtaining informed consent, the colonoscope                            was passed under direct vision. Throughout the                            procedure, the patient's blood pressure, pulse, and                            oxygen saturations were monitored continuously. The                            CF [id number] #[id number] was introduced through the anus                            and advanced to the 3 cm  into the ileum. A second                            forward view of the right colon was performed. The                            colonoscopy was performed without difficulty. The                            patient tolerated the procedure well. The quality                            of the bowel preparation was excellent. The                            terminal ileum, ileocecal valve, appendiceal                            orifice, and rectum were photographed. Scope In: 8:09:12 AM Scope Out: 8:24:46 AM Scope Withdrawal Time: 0 hours 12 minutes 36 seconds  Total Procedure Duration: 0 hours 15 minutes 34 seconds  Findings:                 The perianal and digital rectal examinations were                            normal.                           Multiple small and large-mouthed diverticula were                            found in the sigmoid colon and distal descending                            colon.                           Two sessile polyps were found in the hepatic  flexure. The polyps were 2 to 3 mm in size. These                            polyps were removed with a cold snare. Resection                            and retrieval were complete. Estimated blood loss                            was minimal.                           The exam was otherwise without abnormality on                            direct and retroflexion views. Complications:            No immediate complications. Estimated Blood Loss:     Estimated blood loss was minimal. Impression:               - Diverticulosis in the sigmoid colon and in the                            distal descending colon.                           - Two 2 to 3 mm polyps at the hepatic flexure,                            removed with a cold snare. Resected and retrieved.                           - The examination was otherwise normal on direct                            and retroflexion  views. Recommendation:           - Patient has a contact number available for                            emergencies. The signs and symptoms of potential                            delayed complications were discussed with the                            patient. Return to normal activities tomorrow.                            Written discharge instructions were provided to the                            patient.                           - High  fiber diet.                           - Continue present medications.                           - Await pathology results.                           - Repeat colonoscopy date to be determined after                            pending pathology results are reviewed for                            surveillance.                           - Emerging evidence supports eating a diet of                            fruits, vegetables, grains, calcium, and yogurt                            while reducing red meat and alcohol may reduce the                            risk of colon cancer.                           - Thank you for allowing me to be involved in your                            colon cancer prevention. Thornton Park MD, MD 03/27/2022 8:28:45 AM This report has been signed electronically.

## 2022-03-27 NOTE — Progress Notes (Signed)
Referring Provider: Marin Olp, MD Primary Care Physician:  Marin Olp, MD  Indication for Procedure:  Colon cancer Surveillance   IMPRESSION:  Need for colon cancer surveillance Appropriate candidate for monitored anesthesia care  PLAN: Colonoscopy in the [place] today   HPI: KARI KERTH is a 71 y.o. male presents for surveillance colonoscopy.  Prior endoscopic history: Normal colonoscopy with Dr. Sharlett Iles in 2003 and 2013  No baseline GI symptoms.   No known family history of colon cancer or polyps. No family history of uterine/endometrial cancer, pancreatic cancer or gastric/stomach cancer.   Past Medical History:  Diagnosis Date   Allergy    Basal cell cancer    right cheek; ?left arm; Dr Wilhemina Bonito   Chicken pox    Coronary artery disease    Hyperlipidemia    LDL goal = <115   Myocardial infarction ([place]) 07/27/2017   RAD (reactive airway disease) 1998   cold induced cough @ [place]   Seasonal allergies     Past Surgical History:  Procedure Laterality Date   COLONOSCOPY  2003 & 2013   negative; Dr Sharlett Iles   CORONARY ANGIOPLASTY WITH STENT PLACEMENT  07/27/2017; 08/08/2017   CORONARY STENT INTERVENTION N/A 08/08/2017   Procedure: CORONARY STENT INTERVENTION;  Surgeon: Nigel Mormon, MD;  Location: [place] [facility];  Service: Cardiovascular;  Laterality: N/A;   dental implant     no sedation   EXTRACORPOREAL SHOCK WAVE LITHOTRIPSY Left 08/17/2021   Procedure: LEFT EXTRACORPOREAL SHOCK WAVE LITHOTRIPSY (ESWL);  Surgeon: Robley Fries, MD;  Location: [facility];  Service: Urology;  Laterality: Left;   EYE SURGERY     right eye lid  dropping procedure 9/21   eyelid surgery     bilateral- did not work well for right eyelid    INTRAVASCULAR ULTRASOUND/IVUS N/A 08/08/2017   Procedure: Intravascular Ultrasound/IVUS;  Surgeon: Nigel Mormon, MD;  Location: [place] [facility];  Service: Cardiovascular;   Laterality: N/A;   LEFT HEART CATH AND CORONARY ANGIOGRAPHY N/A 08/08/2017   Procedure: LEFT HEART CATH AND CORONARY ANGIOGRAPHY;  Surgeon: Nigel Mormon, MD;  Location: [place] [facility];  Service: Cardiovascular;  Laterality: N/A;   LITHOTRIPSY  08/17/2021   MOHS SURGERY     "right face; ?left arm"   ROOT CANAL  01/04/2020   Bilateral   TONSILLECTOMY     WRIST GANGLION EXCISION Left    Dr Wonda Olds    Current Outpatient Medications  Medication Sig Dispense Refill   aspirin 81 MG EC tablet TAKE 1 TABLET (81 MG TOTAL) BY MOUTH DAILY. SWALLOW WHOLE. 90 tablet 3   atorvastatin (LIPITOR) 80 MG tablet TAKE 1 TABLET (80 MG TOTAL) BY MOUTH DAILY. 90 tablet 3   losartan (COZAAR) 25 MG tablet TAKE 1 TABLET (25 MG TOTAL) BY MOUTH DAILY. 90 tablet 3   metoprolol succinate (TOPROL-XL) 25 MG 24 hr tablet TAKE 1 TABLET (25 MG TOTAL) BY MOUTH DAILY. 90 tablet 3   CLARITIN 10 MG tablet  (Patient not taking: Reported on 02/27/2022)     EPINEPHrine 0.3 mg/0.3 mL IJ SOAJ injection Inject 0.3 mg into the muscle as needed for anaphylaxis (Bee stings). 2 each 5   fluoruracil (FLUOROPLEX) 1 % cream Apply a small amount as directed twice a day 7-10 days on hands (Patient not taking: Reported on 02/27/2022)     nitroGLYCERIN (NITROSTAT) 0.4 MG SL tablet PLACE 1 TABLET (0.4 MG TOTAL) UNDER THE TONGUE EVERY  5 (FIVE) MINUTES AS NEEDED FOR CHEST PAIN. 25 tablet 3   Current Facility-Administered Medications  Medication Dose Route Frequency Provider Last Rate Last Admin   0.9 %  sodium chloride infusion  500 mL Intravenous Once Thornton Park, MD        Allergies as of 03/27/2022 - Review Complete 03/27/2022  Allergen Reaction Noted   Bee venom Anaphylaxis and Swelling 04/01/2012    Family History  Problem Relation Age of Onset   Breast cancer Mother    Cancer Mother         cervical lymph nodes. mother has not shared primary cancer with him.    Heart disease Mother    Hypertension Mother     Hypertension Father    Heart disease Father        bypass surgery   Heart attack Father 63       Colostomy for ? diagnosis   COPD Father    Cancer Father        was not shared with patient primary cancer.    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    Colon cancer Neg Hx    Esophageal cancer Neg Hx    Stomach cancer Neg Hx    Rectal cancer Neg Hx    Stroke Neg Hx    Diabetes Neg Hx    Colon polyps Neg Hx      Physical Exam: General:   Alert,  well-nourished, pleasant and cooperative in NAD Head:  Normocephalic and atraumatic. Eyes:  Sclera clear, no icterus.   Conjunctiva pink. Mouth:  No deformity or lesions.   Neck:  Supple; no masses or thyromegaly. Lungs:  Clear throughout to auscultation.   No wheezes. Heart:  Regular rate and rhythm; no murmurs. Abdomen:  Soft, non-tender, nondistended, normal bowel sounds, no rebound or guarding.  Msk:  Symmetrical. No boney deformities LAD: No inguinal or umbilical LAD Extremities:  No clubbing or edema. Neurologic:  Alert and  oriented x4;  grossly nonfocal Skin:  No obvious rash or bruise. Psych:  Alert and cooperative. Normal mood and affect.     Studies/Results: No results found.    Stevie Charter L. Tarri Glenn, MD, MPH 03/27/2022, 8:03 AM

## 2022-03-27 NOTE — Patient Instructions (Signed)
Thank you for coming in to see [place] today! Resume your normal diet and medications today. Return to normal daily activities tomorrow. Recommend following high fiber diet. Will make recommendation for future colonoscopy after final results of today's exam are available.   YOU HAD AN ENDOSCOPIC PROCEDURE TODAY AT [facility]:   Refer to the procedure report that was given to you for any specific questions about what was found during the examination.  If the procedure report does not answer your questions, please call your gastroenterologist to clarify.  If you requested that your care partner not be given the details of your procedure findings, then the procedure report has been included in a sealed envelope for you to review at your convenience later.  YOU SHOULD EXPECT: Some feelings of bloating in the abdomen. Passage of more gas than usual.  Walking can help get rid of the air that was put into your GI tract during the procedure and reduce the bloating. If you had a lower endoscopy (such as a colonoscopy or flexible sigmoidoscopy) you may notice spotting of blood in your stool or on the toilet paper. If you underwent a bowel prep for your procedure, you may not have a normal bowel movement for a few days.  Please Note:  You might notice some irritation and congestion in your nose or some drainage.  This is from the oxygen used during your procedure.  There is no need for concern and it should clear up in a day or so.  SYMPTOMS TO REPORT IMMEDIATELY:  Following lower endoscopy (colonoscopy or flexible sigmoidoscopy):  Excessive amounts of blood in the stool  Significant tenderness or worsening of abdominal pains  Swelling of the abdomen that is new, acute  Fever of 100F or higher   For urgent or emergent issues, a gastroenterologist can be reached at any hour by calling [phone]. Do not use MyChart messaging for urgent concerns.    DIET:  We do recommend a small meal  at first, but then you may proceed to your regular diet.  Drink plenty of fluids but you should avoid alcoholic beverages for 24 hours.  ACTIVITY:  You should plan to take it easy for the rest of today and you should NOT DRIVE or use heavy machinery until tomorrow (because of the sedation medicines used during the test).    FOLLOW UP: Our staff will call the number listed on your records the next business day following your procedure.  We will call around 7:15- 8:00 am to check on you and address any questions or concerns that you may have regarding the information given to you following your procedure. If we do not reach you, we will leave a message.  If you develop any symptoms (ie: fever, flu-like symptoms, shortness of breath, cough etc.) before then, please call [phone].  If you test positive for Covid 19 in the 2 weeks post procedure, please call and report this information to [place].    If any biopsies were taken you will be contacted by phone or by letter within the next 1-3 weeks.  Please call us at [phone] if you have not heard about the biopsies in 3 weeks.    SIGNATURES/CONFIDENTIALITY: You and/or your care partner have signed paperwork which will be entered into your electronic medical record.  These signatures attest to the fact that that the information above on your After Visit Summary has been reviewed and is understood.  Full responsibility of the confidentiality  of this discharge information lies with you and/or your care-partner.  

## 2022-03-28 ENCOUNTER — Telehealth: Payer: Self-pay

## 2022-03-28 NOTE — Telephone Encounter (Signed)
  Follow up Call-     03/27/2022    7:13 AM  Call back number  Post procedure Call Back phone  # [phone]  Permission to leave phone message Yes     Patient questions:  Do you have a fever, pain , or abdominal swelling? No. Pain Score  0 *  Have you tolerated food without any problems? Yes.    Have you been able to return to your normal activities? Yes.    Do you have any questions about your discharge instructions: Diet   No. Medications  No. Follow up visit  No.  Do you have questions or concerns about your Care? No.  Actions: * If pain score is 4 or above: No action needed, pain <4.

## 2022-03-30 ENCOUNTER — Other Ambulatory Visit: Payer: Self-pay | Admitting: Cardiology

## 2022-03-30 DIAGNOSIS — I2510 Atherosclerotic heart disease of native coronary artery without angina pectoris: Secondary | ICD-10-CM

## 2022-04-01 ENCOUNTER — Encounter: Payer: Self-pay | Admitting: Gastroenterology

## 2022-04-09 ENCOUNTER — Ambulatory Visit: Payer: Medicare Other

## 2022-04-26 ENCOUNTER — Ambulatory Visit: Payer: Medicare Other | Admitting: Podiatry

## 2022-04-26 ENCOUNTER — Encounter: Payer: Self-pay | Admitting: Podiatry

## 2022-04-26 ENCOUNTER — Ambulatory Visit (INDEPENDENT_AMBULATORY_CARE_PROVIDER_SITE_OTHER): Payer: Medicare Other

## 2022-04-26 DIAGNOSIS — G5762 Lesion of plantar nerve, left lower limb: Secondary | ICD-10-CM

## 2022-04-26 DIAGNOSIS — D3610 Benign neoplasm of peripheral nerves and autonomic nervous system, unspecified: Secondary | ICD-10-CM

## 2022-04-26 DIAGNOSIS — M7752 Other enthesopathy of left foot: Secondary | ICD-10-CM

## 2022-04-26 MED ORDER — TRIAMCINOLONE ACETONIDE 10 MG/ML IJ SUSP
10.0000 mg | Freq: Once | INTRAMUSCULAR | Status: AC
  Administered 2022-04-26: 10 mg

## 2022-04-26 NOTE — Progress Notes (Signed)
Subjective:   Patient ID: Dale Gonzalez, male   DOB: 71 y.o.   MRN: [id number]   HPI Patient states he is developing pain recently in his left forefoot and its not like it used to be.  Not shooting burning but it is painful with activity and can ache at times   ROS      Objective:  Physical Exam  Neurovascular status intact with inflammation that is more in the third metatarsal phalangeal joint left with low-grade fluid within the joint surface.  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

## 2022-05-03 ENCOUNTER — Ambulatory Visit (INDEPENDENT_AMBULATORY_CARE_PROVIDER_SITE_OTHER): Payer: Medicare Other

## 2022-05-03 DIAGNOSIS — Z0000 Encounter for general adult medical examination without abnormal findings: Secondary | ICD-10-CM

## 2022-05-03 NOTE — Patient Instructions (Signed)
Dale Gonzalez , Thank you for taking time to come for your Medicare Wellness Visit. I appreciate your ongoing commitment to your health goals. Please review the following plan we discussed and let me know if I can assist you in the future.   Screening recommendations/referrals: Colonoscopy: done 04/04/18 repeat every 10 years  Recommended yearly ophthalmology/optometry visit for glaucoma screening and checkup Recommended yearly dental visit for hygiene and checkup  Vaccinations: Influenza vaccine: done 9/12/223 repeat every year  Pneumococcal vaccine: Up to date Tdap vaccine: done 02/18/14 repeat every 10 years  Shingles vaccine: completed 11/26/118, 10/17/17   Covid-19: completed 1/16, 2/3, 05/11/20 & 02/16/21  Advanced directives: copies in chart   Conditions/risks identified: continue to maintain health   Next appointment: Follow up in one year for your annual wellness visit.   Preventive Care 52 Years and Older, Male Preventive care refers to lifestyle choices and visits with your health care provider that can promote health and wellness. What does preventive care include? A yearly physical exam. This is also called an annual well check. Dental exams once or twice a year. Routine eye exams. Ask your health care provider how often you should have your eyes checked. Personal lifestyle choices, including: Daily care of your teeth and gums. Regular physical activity. Eating a healthy diet. Avoiding tobacco and drug use. Limiting alcohol use. Practicing safe sex. Taking low doses of aspirin every day. Taking vitamin and mineral supplements as recommended by your health care provider. What happens during an annual well check? The services and screenings done by your health care provider during your annual well check will depend on your age, overall health, lifestyle risk factors, and family history of disease. Counseling  Your health care provider may ask you questions about  your: Alcohol use. Tobacco use. Drug use. Emotional well-being. Home and relationship well-being. Sexual activity. Eating habits. History of falls. Memory and ability to understand (cognition). Work and work Statistician. Screening  You may have the following tests or measurements: Height, weight, and BMI. Blood pressure. Lipid and cholesterol levels. These may be checked every 5 years, or more frequently if you are over 21 years old. Skin check. Lung cancer screening. You may have this screening every year starting at age 5 if you have a 30-pack-year history of smoking and currently smoke or have quit within the past 15 years. Fecal occult blood test (FOBT) of the stool. You may have this test every year starting at age 97. Flexible sigmoidoscopy or colonoscopy. You may have a sigmoidoscopy every 5 years or a colonoscopy every 10 years starting at age 5. Prostate cancer screening. Recommendations will vary depending on your family history and other risks. Hepatitis C blood test. Hepatitis B blood test. Sexually transmitted disease (STD) testing. Diabetes screening. This is done by checking your blood sugar (glucose) after you have not eaten for a while (fasting). You may have this done every 1-3 years. Abdominal aortic aneurysm (AAA) screening. You may need this if you are a current or former smoker. Osteoporosis. You may be screened starting at age 34 if you are at high risk. Talk with your health care provider about your test results, treatment options, and if necessary, the need for more tests. Vaccines  Your health care provider may recommend certain vaccines, such as: Influenza vaccine. This is recommended every year. Tetanus, diphtheria, and acellular pertussis (Tdap, Td) vaccine. You may need a Td booster every 10 years. Zoster vaccine. You may need this after age 59. Pneumococcal 13-valent  conjugate (PCV13) vaccine. One dose is recommended after age 3. Pneumococcal  polysaccharide (PPSV23) vaccine. One dose is recommended after age 20. Talk to your health care provider about which screenings and vaccines you need and how often you need them. This information is not intended to replace advice given to you by your health care provider. Make sure you discuss any questions you have with your health care provider. Document Released: 09/02/2015 Document Revised: 04/25/2016 Document Reviewed: 06/07/2015 Elsevier Interactive Patient Education  2017 [place] Prevention in the Home Falls can cause injuries. They can happen to people of all ages. There are many things you can do to make your home safe and to help prevent falls. What can I do on the outside of my home? Regularly fix the edges of walkways and driveways and fix any cracks. Remove anything that might make you trip as you walk through a door, such as a raised step or threshold. Trim any bushes or trees on the path to your home. Use bright outdoor lighting. Clear any walking paths of anything that might make someone trip, such as rocks or tools. Regularly check to see if handrails are loose or broken. Make sure that both sides of any steps have handrails. Any raised decks and porches should have guardrails on the edges. Have any leaves, snow, or ice cleared regularly. Use sand or salt on walking paths during winter. Clean up any spills in your garage right away. This includes oil or grease spills. What can I do in the bathroom? Use night lights. Install grab bars by the toilet and in the tub and shower. Do not use towel bars as grab bars. Use non-skid mats or decals in the tub or shower. If you need to sit down in the shower, use a plastic, non-slip stool. Keep the floor dry. Clean up any water that spills on the floor as soon as it happens. Remove soap buildup in the tub or shower regularly. Attach bath mats securely with double-sided non-slip rug tape. Do not have throw rugs and other  things on the floor that can make you trip. What can I do in the bedroom? Use night lights. Make sure that you have a light by your bed that is easy to reach. Do not use any sheets or blankets that are too big for your bed. They should not hang down onto the floor. Have a firm chair that has side arms. You can use this for support while you get dressed. Do not have throw rugs and other things on the floor that can make you trip. What can I do in the kitchen? Clean up any spills right away. Avoid walking on wet floors. Keep items that you use a lot in easy-to-reach places. If you need to reach something above you, use a strong step stool that has a grab bar. Keep electrical cords out of the way. Do not use floor polish or wax that makes floors slippery. If you must use wax, use non-skid floor wax. Do not have throw rugs and other things on the floor that can make you trip. What can I do with my stairs? Do not leave any items on the stairs. Make sure that there are handrails on both sides of the stairs and use them. Fix handrails that are broken or loose. Make sure that handrails are as long as the stairways. Check any carpeting to make sure that it is firmly attached to the stairs. Fix any carpet that  is loose or worn. Avoid having throw rugs at the top or bottom of the stairs. If you do have throw rugs, attach them to the floor with carpet tape. Make sure that you have a light switch at the top of the stairs and the bottom of the stairs. If you do not have them, ask someone to add them for you. What else can I do to help prevent falls? Wear shoes that: Do not have high heels. Have rubber bottoms. Are comfortable and fit you well. Are closed at the toe. Do not wear sandals. If you use a stepladder: Make sure that it is fully opened. Do not climb a closed stepladder. Make sure that both sides of the stepladder are locked into place. Ask someone to hold it for you, if possible. Clearly  mark and make sure that you can see: Any grab bars or handrails. First and last steps. Where the edge of each step is. Use tools that help you move around (mobility aids) if they are needed. These include: Canes. Walkers. Scooters. Crutches. Turn on the lights when you go into a dark area. Replace any light bulbs as soon as they burn out. Set up your furniture so you have a clear path. Avoid moving your furniture around. If any of your floors are uneven, fix them. If there are any pets around you, be aware of where they are. Review your medicines with your doctor. Some medicines can make you feel dizzy. This can increase your chance of falling. Ask your doctor what other things that you can do to help prevent falls. This information is not intended to replace advice given to you by your health care provider. Make sure you discuss any questions you have with your health care provider. Document Released: 06/02/2009 Document Revised: 01/12/2016 Document Reviewed: 09/10/2014 Elsevier Interactive Patient Education  2017 [employer].

## 2022-05-03 NOTE — Progress Notes (Signed)
Subjective:   Dale Gonzalez is a 71 y.o. male who presents for Medicare Annual/Subsequent preventive examination.  Review of Systems     Cardiac Risk Factors include: advanced age (>37mn, >>1women);dyslipidemia;hypertension;male gender     Objective:    There were no vitals filed for this visit. There is no height or weight on file to calculate BMI.     05/03/2022   10:11 AM 08/17/2021   12:37 PM 04/03/2021    8:07 AM 03/28/2020    8:04 AM 08/08/2017    7:08 AM  Advanced Directives  Does Patient Have a Medical Advance Directive? Yes Yes Yes Yes No  Type of AParamedicof [place]ringtonLiving will [place]Living will Living will [place]Living will   Does patient want to make changes to medical advance directive? No - Patient declined      Copy of [place] Chart? Yes - validated most recent copy scanned in chart (See row information)   No - copy requested   Would patient like information on creating a medical advance directive?     No - Patient declined    Current Medications (verified) Outpatient Encounter Medications as of 05/03/2022  Medication Sig   ASPIRIN LOW DOSE 81 MG tablet TAKE 1 TABLET BY MOUTH DAILY. SWALLOW WHOLE   atorvastatin (LIPITOR) 80 MG tablet TAKE 1 TABLET (80 MG TOTAL) BY MOUTH DAILY.   EPINEPHrine 0.3 mg/0.3 mL IJ SOAJ injection Inject 0.3 mg into the muscle as needed for anaphylaxis (Bee stings).   fluoruracil (FLUOROPLEX) 1 % cream    losartan (COZAAR) 25 MG tablet TAKE 1 TABLET (25 MG TOTAL) BY MOUTH DAILY.   metoprolol succinate (TOPROL-XL) 25 MG 24 hr tablet TAKE 1 TABLET (25 MG TOTAL) BY MOUTH DAILY.   nitroGLYCERIN (NITROSTAT) 0.4 MG SL tablet PLACE 1 TABLET (0.4 MG TOTAL) UNDER THE TONGUE EVERY 5 (FIVE) MINUTES AS NEEDED FOR CHEST PAIN.   [DISCONTINUED] CLARITIN 10 MG tablet  (Patient not taking: Reported on 02/27/2022)   No facility-administered encounter  medications on file as of 05/03/2022.    Allergies (verified) Bee venom   History: Past Medical History:  Diagnosis Date   Allergy    Basal cell cancer    right cheek; ?left arm; Dr DWilhemina Bonito  Chicken pox    Coronary artery disease    Hyperlipidemia    LDL goal = <115   Myocardial infarction ([facility] 07/27/2017   RAD (reactive airway disease) 1998   cold induced cough [place] [facility]  Seasonal allergies    Past Surgical History:  Procedure Laterality Date   COLONOSCOPY  2003 & 2013   negative; Dr PSharlett Iles  CORONARY ANGIOPLASTY WITH STENT PLACEMENT  07/27/2017; 08/08/2017   CORONARY STENT INTERVENTION N/A 08/08/2017   Procedure: CORONARY STENT INTERVENTION;  Surgeon: PNigel Mormon MD;  [facility];  Service: Cardiovascular;  Laterality: N/A;   dental implant     no sedation   EXTRACORPOREAL SHOCK WAVE LITHOTRIPSY Left 08/17/2021   Procedure: LEFT EXTRACORPOREAL SHOCK WAVE LITHOTRIPSY (ESWL);  Surgeon: PRobley Fries MD;  [facility]  Service: Urology;  Laterality: Left;   EYE SURGERY     right eye lid  dropping procedure 9/21   eyelid surgery     bilateral- did not work well for right eyelid    INTRAVASCULAR ULTRASOUND/IVUS N/A 08/08/2017   Procedure: Intravascular Ultrasound/IVUS;  Surgeon: PVernell Leep  J, MD;  Location: [place] [facility];  Service: Cardiovascular;  Laterality: N/A;   LEFT HEART CATH AND CORONARY ANGIOGRAPHY N/A 08/08/2017   Procedure: LEFT HEART CATH AND CORONARY ANGIOGRAPHY;  Surgeon: Nigel Mormon, MD;  Location: [place] [facility];  Service: Cardiovascular;  Laterality: N/A;   LITHOTRIPSY  08/17/2021   MOHS SURGERY     "right face; ?left arm"   ROOT CANAL  01/04/2020   Bilateral   TONSILLECTOMY     WRIST GANGLION EXCISION Left    Dr Wonda Olds   Family History  Problem Relation Age of Onset   Breast cancer Mother    Cancer Mother         cervical lymph nodes. mother has not shared  primary cancer with him.    Heart disease Mother    Hypertension Mother    Hypertension Father    Heart disease Father        bypass surgery   Heart attack Father 25       Colostomy for ? diagnosis   COPD Father    Cancer Father        was not shared with patient primary cancer.    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    Colon cancer Neg Hx    Esophageal cancer Neg Hx    Stomach cancer Neg Hx    Rectal cancer Neg Hx    Stroke Neg Hx    Diabetes Neg Hx    Colon polyps Neg Hx    Social History   Socioeconomic History   Marital status: Married    Spouse name: Not on file   Number of children: 2   Years of education: Not on file   Highest education level: Not on file  Occupational History   Occupation: Retired  Tobacco Use   Smoking status: Never   Smokeless tobacco: Never  Vaping Use   Vaping Use: Never used  Substance and Sexual Activity   Alcohol use: Yes    Alcohol/week: 1.0 standard drink of alcohol    Types: 1 Shots of liquor per week    Comment: occ   Drug use: No   Sexual activity: Not Currently  Other Topics Concern   Not on file  Social History Narrative   Married 45 years in 2021.  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    Works part time for his prior business- may stop in 2021- about 10 hours a week      Hobbies: enjoys spending time with his grandkids, regular exercise. Active in his church- [employer].    Social Determinants of Health   Financial Resource Strain: Low Risk  (05/03/2022)   Overall Financial Resource Strain (CARDIA)    Difficulty of Paying Living Expenses: Not hard at all  Food Insecurity: No Food Insecurity (05/03/2022)   Hunger Vital Sign     Worried About Running Out of Food in the Last Year: Never true    Ran Out of Food in the Last Year: Never true  Transportation Needs: No Transportation Needs (05/03/2022)   PRAPARE - Hydrologist (Medical): No  Lack of Transportation (Non-Medical): No  Physical Activity: Sufficiently Active (05/03/2022)   Exercise Vital Sign    Days of Exercise per Week: 7 days    Minutes of Exercise per Session: 150+ min  Stress: No Stress Concern Present (05/03/2022)   [place]    Feeling of Stress : Only a little  Social Connections: Socially Integrated (05/03/2022)   Social Connection and Isolation Panel [NHANES]    Frequency of Communication with Friends and Family: More than three times a week    Frequency of Social Gatherings with Friends and Family: More than three times a week    Attends Religious Services: More than 4 times per year    Active Member of [employer] or Organizations: Yes    Attends Archivist Meetings: 1 to 4 times per year    Marital Status: Married    Tobacco Counseling Counseling given: Not Answered   Clinical Intake:  Pre-visit preparation completed: Yes  Pain : No/denies pain     BMI - recorded: 26.31 Nutritional Status: BMI 25 -29 Overweight Nutritional Risks: None Diabetes: No  How often do you need to have someone help you when you read instructions, pamphlets, or other written materials from your doctor or pharmacy?: 1 - Never  Diabetic?no  Interpreter Needed?: No  Information entered by :: Charlott Rakes, LPN   Activities of Daily Living    05/03/2022   10:12 AM 08/17/2021   12:40 PM  In your present state of health, do you have any difficulty performing the following activities:  Hearing? 0 0  Vision? 0 0  Difficulty concentrating or making decisions? 0 0  Walking or climbing stairs? 0 0  Dressing or bathing? 0 0  Doing errands, shopping? 0    Preparing Food and eating ? N   Using the Toilet? N   In the past six months, have you accidently leaked urine? N   Do you have problems with loss of bowel control? N   Managing your Medications? N   Managing your Finances? N   Housekeeping or managing your Housekeeping? N     Patient Care Team: Marin Olp, MD as PCP - General (Family Medicine) Nigel Mormon, MD as Consulting Physician (Cardiology) Danella Sensing, MD as Consulting Physician (Dermatology) Luberta Mutter, MD as Consulting Physician (Ophthalmology) Paulla Dolly Tamala Fothergill, DPM as Consulting Physician (Podiatry) Solon Augusta, MD as Attending Physician (Ophthalmology) Madelin Rear, [facility] (Inactive) as Pharmacist (Pharmacist)  Indicate any recent Medical Services you may have received from other than [facility] providers in the past year (date may be approximate).     Assessment:   This is a routine wellness examination for Exzavier.  Hearing/Vision screen Hearing Screening - Comments:: Pt denies any  hearing issues  Vision Screening - Comments:: Pt follows up with [facility]   Dietary issues and exercise activities discussed: Current Exercise Habits: Home exercise routine, Type of exercise: Other - see comments;walking, Time (Minutes): > 60, Frequency (Times/Week): 7, Weekly Exercise (Minutes/Week): 0   Goals Addressed             This Visit's Progress    Patient Stated       Continue to exercise        Depression Screen    05/03/2022   10:10 AM 06/16/2021   10:40 AM 04/03/2021    8:06 AM 04/19/2020   10:28 AM 03/28/2020    8:02 AM 06/19/2019   12:17 PM 10/07/2017  11:15 AM  PHQ 2/9 Scores  PHQ - 2 Score 0 0 0 0 0 0 0  PHQ- 9 Score    0  0     Fall Risk    05/03/2022   10:12 AM 06/16/2021   10:40 AM 04/03/2021    8:08 AM 04/19/2020   10:29 AM 03/28/2020    8:06 AM  Fall Risk   Falls in the past year? 0 0  0 0  Number falls in past yr: 0 0 0 0 0  Injury with Fall? 0 0 0 0 0  Risk for fall  due to : No Fall Risks;Impaired vision  Impaired vision  Impaired vision  Follow up Falls prevention discussed  Falls prevention discussed  Falls prevention discussed    FALL RISK PREVENTION PERTAINING TO THE HOME:  Any stairs in or around the home? Yes  If so, are there any without handrails? No  Home free of loose throw rugs in walkways, pet beds, electrical cords, etc? Yes  Adequate lighting in your home to reduce risk of falls? Yes   ASSISTIVE DEVICES UTILIZED TO PREVENT FALLS:  Life alert? No  Use of a cane, walker or w/c? No  Grab bars in the bathroom? No  Shower chair or bench in shower? No  Elevated toilet seat or a handicapped toilet? No   TIMED UP AND GO:  Was the test performed? No .   Cognitive Function:        05/03/2022   10:12 AM 04/03/2021    8:10 AM 03/28/2020    8:09 AM  6CIT Screen  What Year? 0 points 0 points   What month? 0 points 0 points   What time? 0 points 0 points   Count back from 20 0 points 0 points 0 points  Months in reverse 0 points 0 points 0 points  Repeat phrase 0 points 0 points 0 points  Total Score 0 points 0 points     Immunizations Immunization History  Administered Date(s) Administered   Fluad Quad(high Dose 65+) 05/17/2020, 05/01/2021   Influenza Whole 07/08/2007, 05/20/2008, 08/04/2008, 06/01/2010   Influenza,inj,Quad PF,6+ Mos 05/18/2014   Influenza-Unspecified 07/01/2017, 04/08/2019   PFIZER Comirnaty(Gray Top)Covid-19 Tri-Sucrose Vaccine 02/16/2021   PFIZER(Purple Top)SARS-COV-2 Vaccination 09/05/2019, 09/23/2019, 05/11/2020   PNEUMOCOCCAL CONJUGATE-20 05/01/2021   Pneumococcal Conjugate-13 08/02/2015, 03/27/2016   Td 12/24/2008   Tdap 02/18/2014   Zoster Recombinat (Shingrix) 07/15/2017, 10/17/2017   Zoster, Live 01/04/2011    TDAP status: Up to date  Flu Vaccine status: Up to date  Pneumococcal vaccine status: Up to date  Covid-19 vaccine status: Completed vaccines  Qualifies for Shingles Vaccine? Yes    Zostavax completed Yes   Shingrix Completed?: Yes  Screening Tests Health Maintenance  Topic Date Due   COVID-19 Vaccine (5 - Pfizer risk series) 04/13/2021   INFLUENZA VACCINE  03/20/2022   TETANUS/TDAP  02/19/2024   COLONOSCOPY (Pts 45-41yr Insurance coverage will need to be confirmed)  03/27/2032   Pneumonia Vaccine 72 Years old  Completed   Hepatitis C Screening  Completed   Zoster Vaccines- Shingrix  Completed   HPV VACCINES  Aged Out    Health Maintenance  Health Maintenance Due  Topic Date Due   COVID-19 Vaccine (5 - Pfizer risk series) 04/13/2021   INFLUENZA VACCINE  03/20/2022    Colorectal cancer screening: Type of screening: Colonoscopy. Completed 04/04/18. Repeat every 10 years  Additional Screening:  Hepatitis C Screening:  Completed 08/02/15  Vision Screening: Recommended annual  ophthalmology exams for early detection of glaucoma and other disorders of the eye. Is the patient up to date with their annual eye exam?  Yes  Who is the provider or what is the name of the office in which the patient attends annual eye exams? [facility]  If pt is not established with a provider, would they like to be referred to a provider to establish care? No .   Dental Screening: Recommended annual dental exams for proper oral hygiene  Community Resource Referral / Chronic Care Management: CRR required this visit?  No   CCM required this visit?  No      Plan:     I have personally reviewed and noted the following in the patient's chart:   Medical and social history Use of alcohol, tobacco or illicit drugs  Current medications and supplements including opioid prescriptions. Patient is not currently taking opioid prescriptions. Functional ability and status Nutritional status Physical activity Advanced directives List of other physicians Hospitalizations, surgeries, and ER visits in previous 12 months Vitals Screenings to include cognitive, depression, and  falls Referrals and appointments  In addition, I have reviewed and discussed with patient certain preventive protocols, quality metrics, and best practice recommendations. A written personalized care plan for preventive services as well as general preventive health recommendations were provided to patient.     Willette Brace, LPN   03/30/5725   Nurse Notes: none

## 2022-05-05 IMAGING — CT CT ABD-PEL WO/W CM
3 of 10 series · 12 of 46 positions shown, 18 images · IV contrast (Omnipaque)
Comparison: None.

CLINICAL DATA: Hematuria.

EXAM:
CT ABDOMEN AND PELVIS WITHOUT AND WITH CONTRAST
TECHNIQUE: Multidetector CT imaging of the abdomen and pelvis was performed
following the standard protocol before and following the bolus
administration of intravenous contrast.
CONTRAST:  100mL OMNIPAQUE IOHEXOL 300 MG/ML  SOLN

[Series 3: coronal pre · coronal · non-contrast · 0.86mm/px · 2 of 86 slices shown, 3 images]
[im 29/86  soft-tissue]
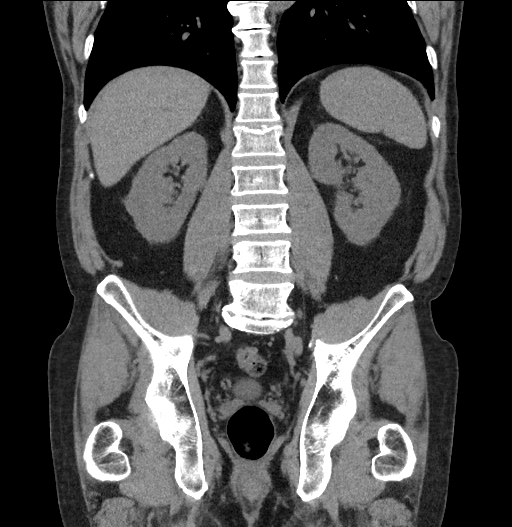
[im 29/86  bone]
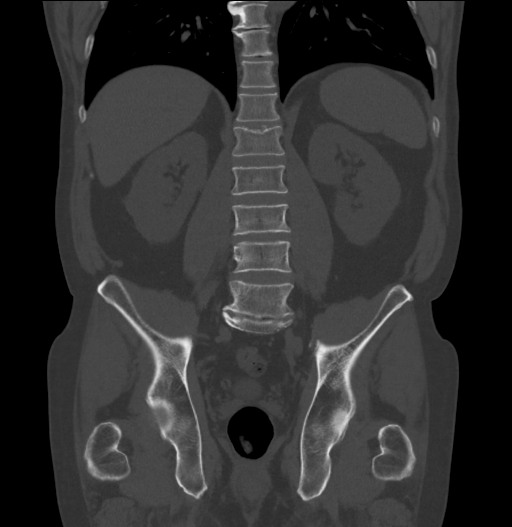
[im 57/86  soft-tissue]
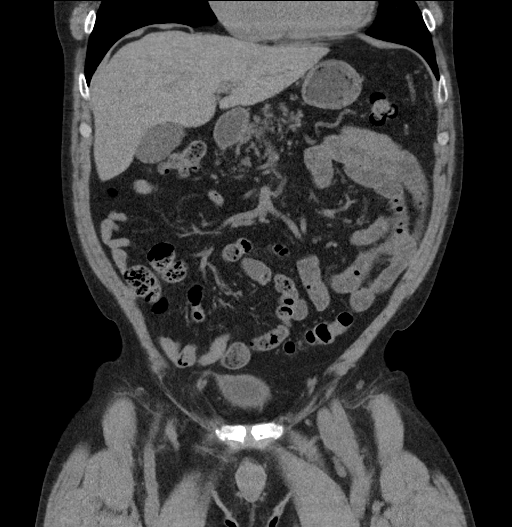

[Series 6: axial post · axial · 0.75mm/px · z∈[+864,+1194]mm · 7 of 90 slices shown, 12 images]
[im 12/90  soft-tissue]
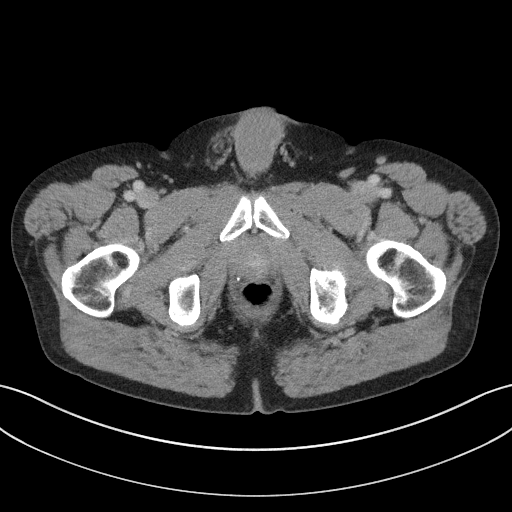
[im 12/90  bone]
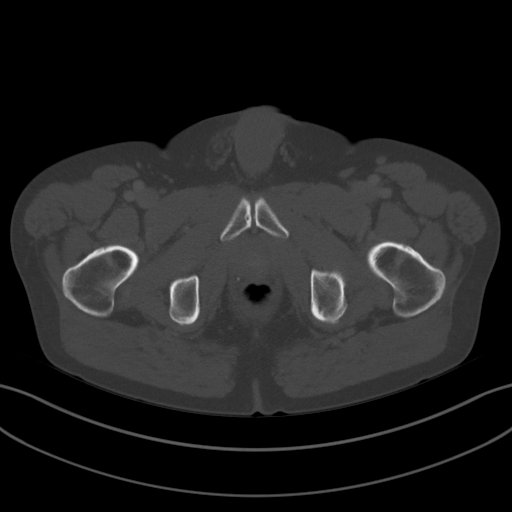
[im 23/90  soft-tissue]
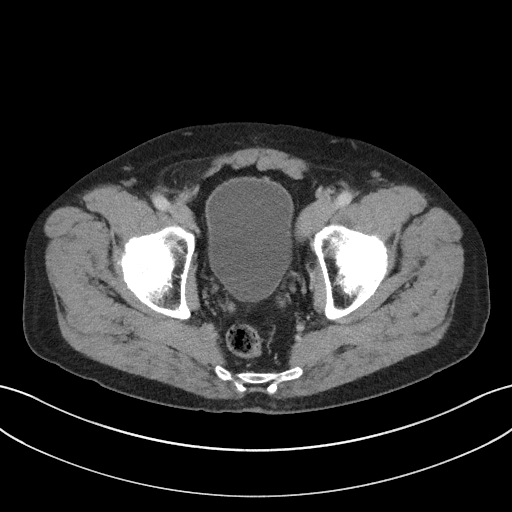
[im 34/90  soft-tissue]
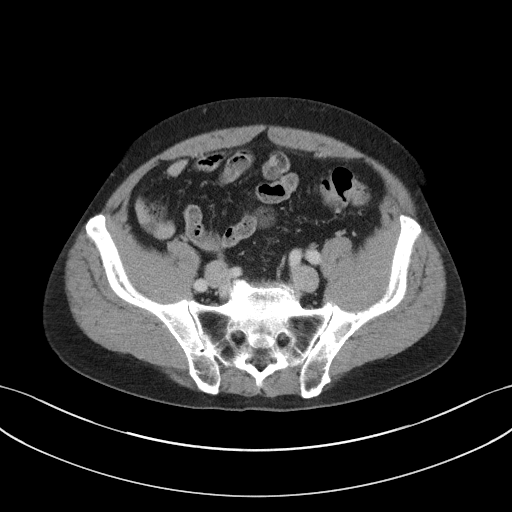
[im 45/90  soft-tissue]
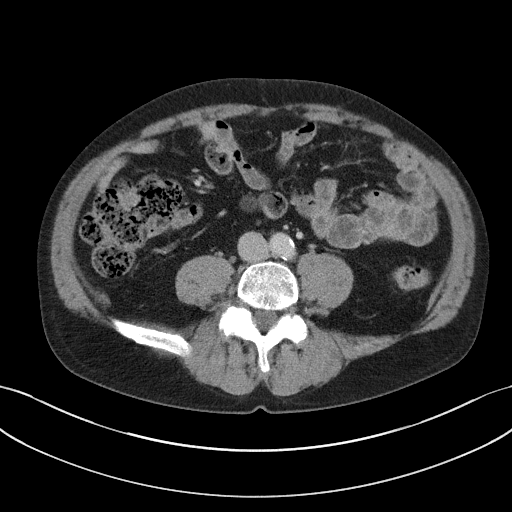
[im 45/90  lung]
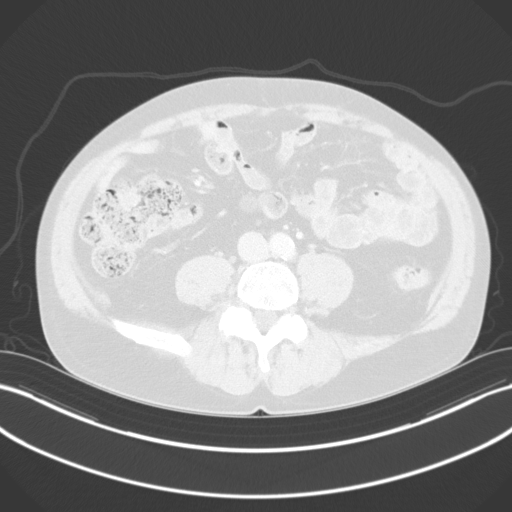
[im 56/90  soft-tissue]
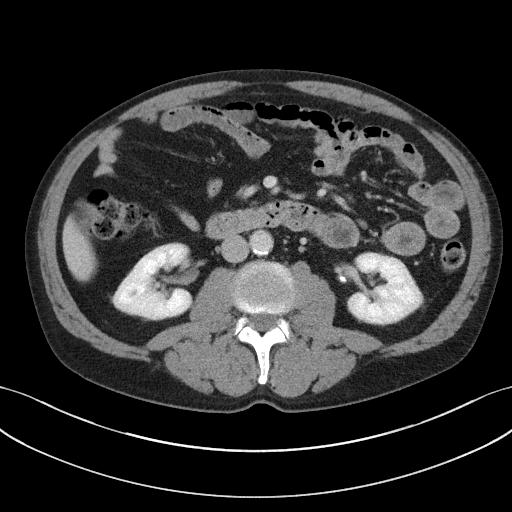
[im 56/90  lung]
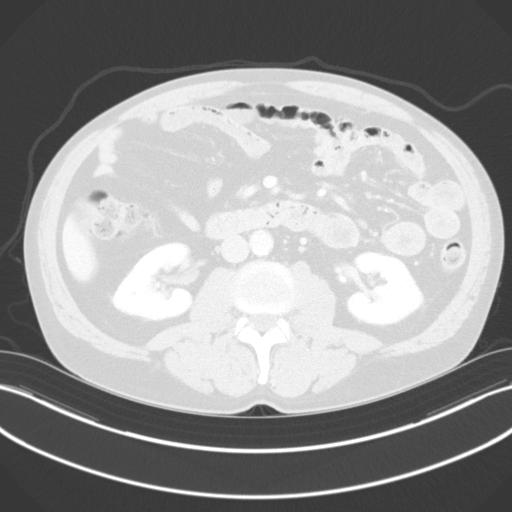
[im 67/90  soft-tissue]
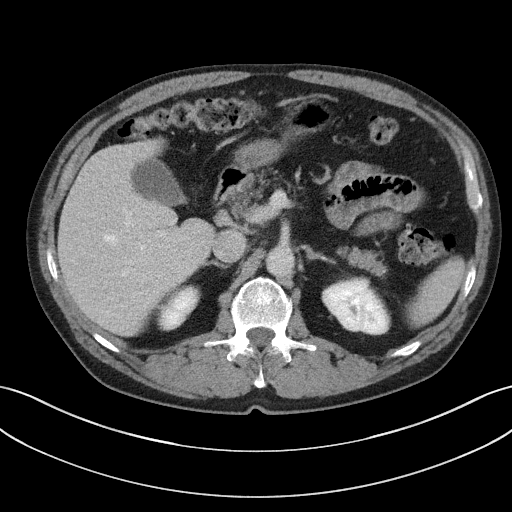
[im 67/90  lung]
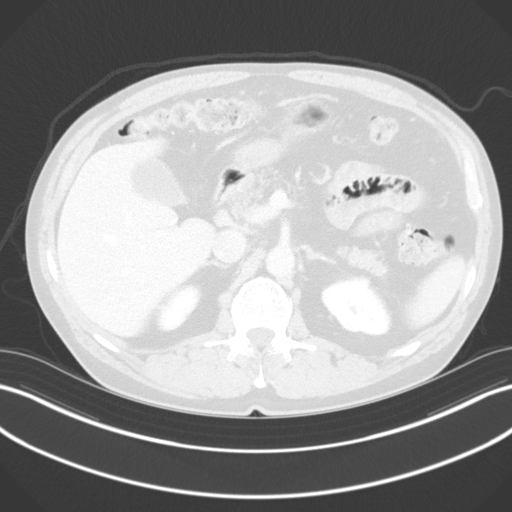
[im 78/90  soft-tissue]
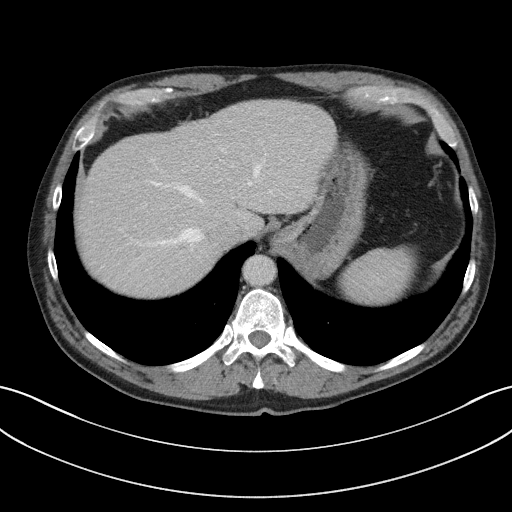
[im 78/90  lung]
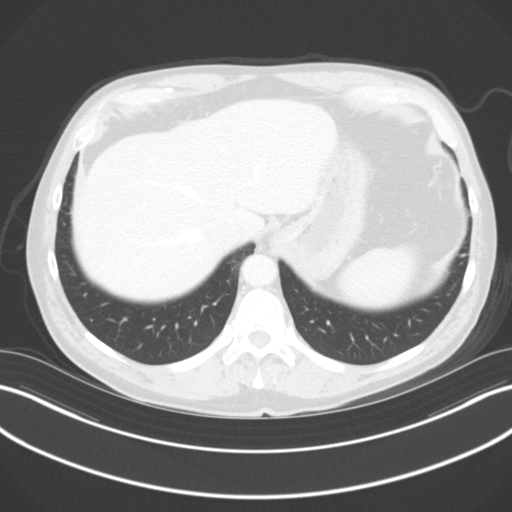

[Series 12: axial delay · axial · delayed · 0.77mm/px · z∈[+884,+994]mm · 3 of 91 slices shown]
[im 12/91  soft-tissue]
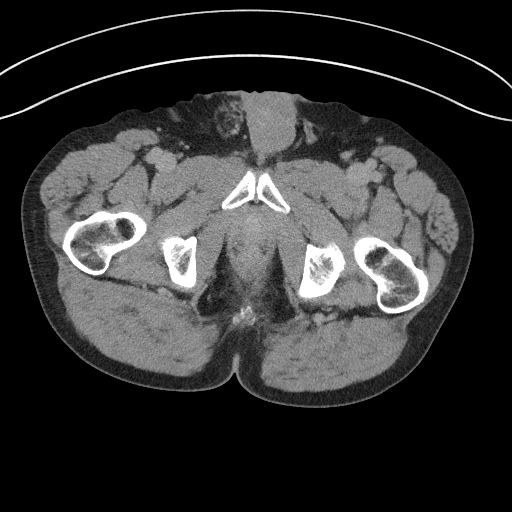
[im 23/91  soft-tissue]
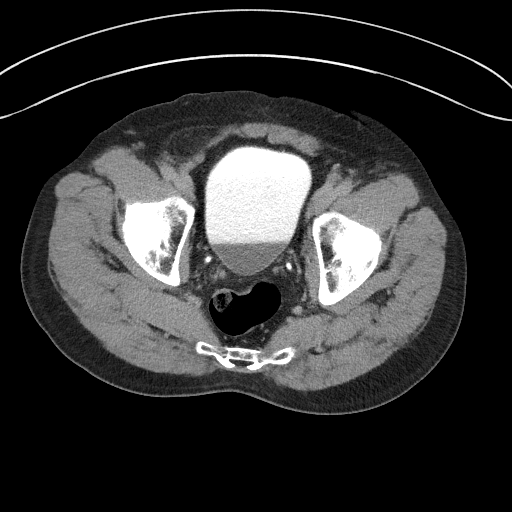
[im 34/91  soft-tissue]
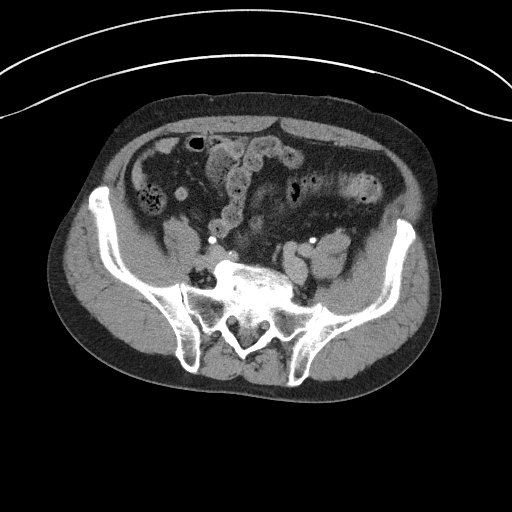

[12 of 46 positions shown; findings below may reference images not displayed]

FINDINGS: Lower chest: No significant pulmonary nodules or acute consolidative
airspace disease.

Hepatobiliary: Normal liver size. No liver mass. Normal gallbladder
with no radiopaque cholelithiasis. No biliary ductal dilatation.

Pancreas: Normal, with no mass or duct dilation.

Spleen: Normal size. No mass.

Adrenals/Urinary Tract: Normal adrenals. Nonobstructing 4 mm left
renal pelvis stone. Nonobstructing punctate 1 mm upper right renal
stone. No hydronephrosis. Normal caliber ureters. No ureteral
stones. No renal cortical masses. On delayed imaging, there is no
urothelial wall thickening and there are no filling defects in the
opacified portions of the bilateral collecting systems or ureters.
No bladder stones, masses, wall thickening or diverticula.

Stomach/Bowel: Normal non-distended stomach. Normal caliber small
bowel with no small bowel wall thickening. Normal appendix. Moderate
left colonic diverticulosis with no large bowel wall thickening or
significant pericolonic fat stranding.

Vascular/Lymphatic: Atherosclerotic nonaneurysmal abdominal aorta.
Patent portal, splenic, hepatic and renal veins. No pathologically
enlarged lymph nodes in the abdomen or pelvis.

Reproductive: Top-normal size prostate with coarse nonspecific
internal prostatic calcifications.

Other: No pneumoperitoneum, ascites or focal fluid collection. Small
to moderate fat containing right inguinal hernia.

Musculoskeletal: No aggressive appearing focal osseous lesions.
Moderate degenerative disc disease at L5-S1.
IMPRESSION: 1. Nonobstructing bilateral nephrolithiasis. No hydronephrosis. No
ureteral or bladder stones. No renal cortical masses. No evidence of
urothelial lesions.
2. Moderate left colonic diverticulosis.
3. Small to moderate fat containing right inguinal hernia.
4. Aortic Atherosclerosis (TF7T7-D80.0).

## 2022-05-14 ENCOUNTER — Encounter: Payer: Self-pay | Admitting: *Deleted

## 2022-05-31 ENCOUNTER — Encounter: Payer: Self-pay | Admitting: Podiatry

## 2022-05-31 ENCOUNTER — Ambulatory Visit: Payer: Medicare Other | Admitting: Podiatry

## 2022-05-31 DIAGNOSIS — M7752 Other enthesopathy of left foot: Secondary | ICD-10-CM

## 2022-05-31 NOTE — Progress Notes (Signed)
Subjective:   Patient ID: Dale Gonzalez, male   DOB: 71 y.o.   MRN: [id number]   HPI Patient presents stating he is getting more numbness now than he was he just wanted that checked stating that the pain seems to have resolved with the last treatment   ROS      Objective:  Physical Exam  Neuro vascular status intact with patient's pain improving discomfort of a mild nature noted but minimal with no acute inflammatory condition     Assessment:  Appears to be a low-level inflammatory condition but the numbness may be related to the neuroma excision which could be causing him some instability of the area     Plan:  H&P reviewed condition recommended the continuation of wider shoes dispensed metatarsal pad discharge reappoint as needed

## 2022-06-21 ENCOUNTER — Ambulatory Visit (INDEPENDENT_AMBULATORY_CARE_PROVIDER_SITE_OTHER): Payer: Medicare Other | Admitting: *Deleted

## 2022-06-21 DIAGNOSIS — Z23 Encounter for immunization: Secondary | ICD-10-CM

## 2022-07-06 IMAGING — DX DG ABDOMEN 1V
2 series · 2 of 2 positions shown · non-contrast
Comparison: [DATE] [DATE], [DATE].  [DATE] [DATE], [DATE].

CLINICAL DATA: Nephrolithiasis.

EXAM:
ABDOMEN - 1 VIEW

[abdomen kub (1 of 2)]
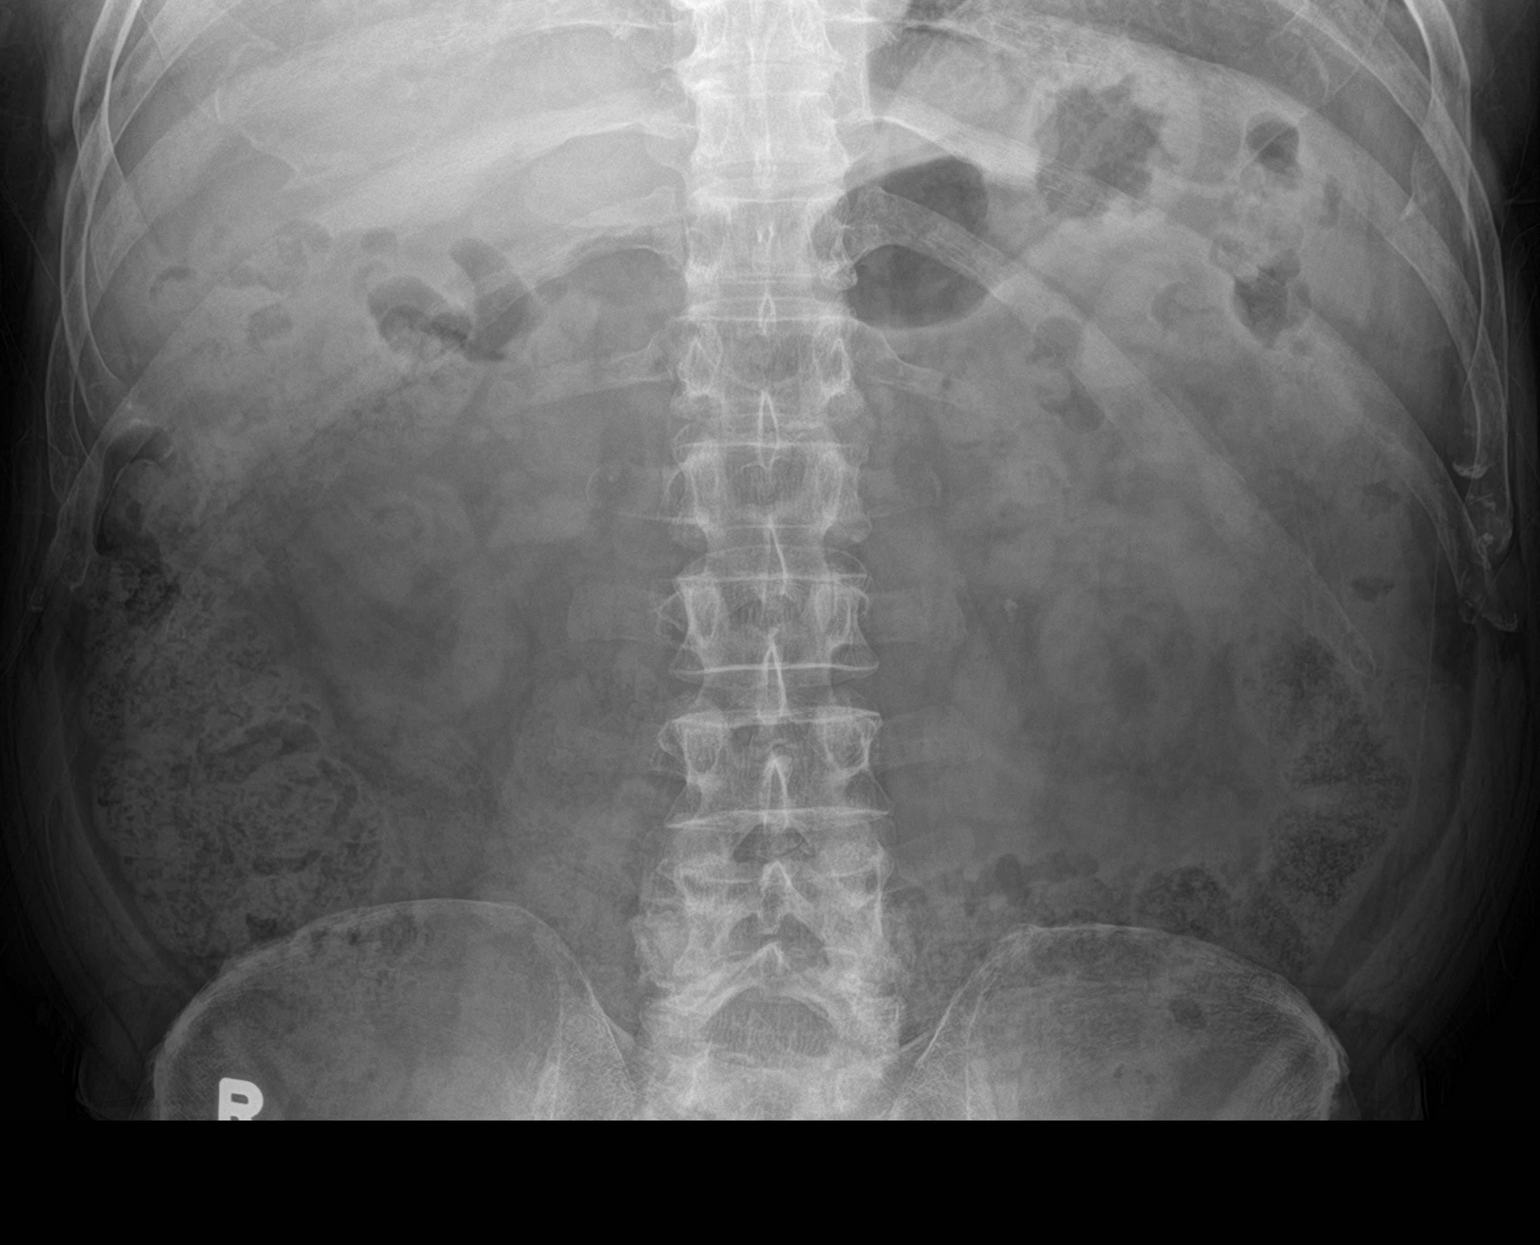

[abdomen kub (2 of 2)]
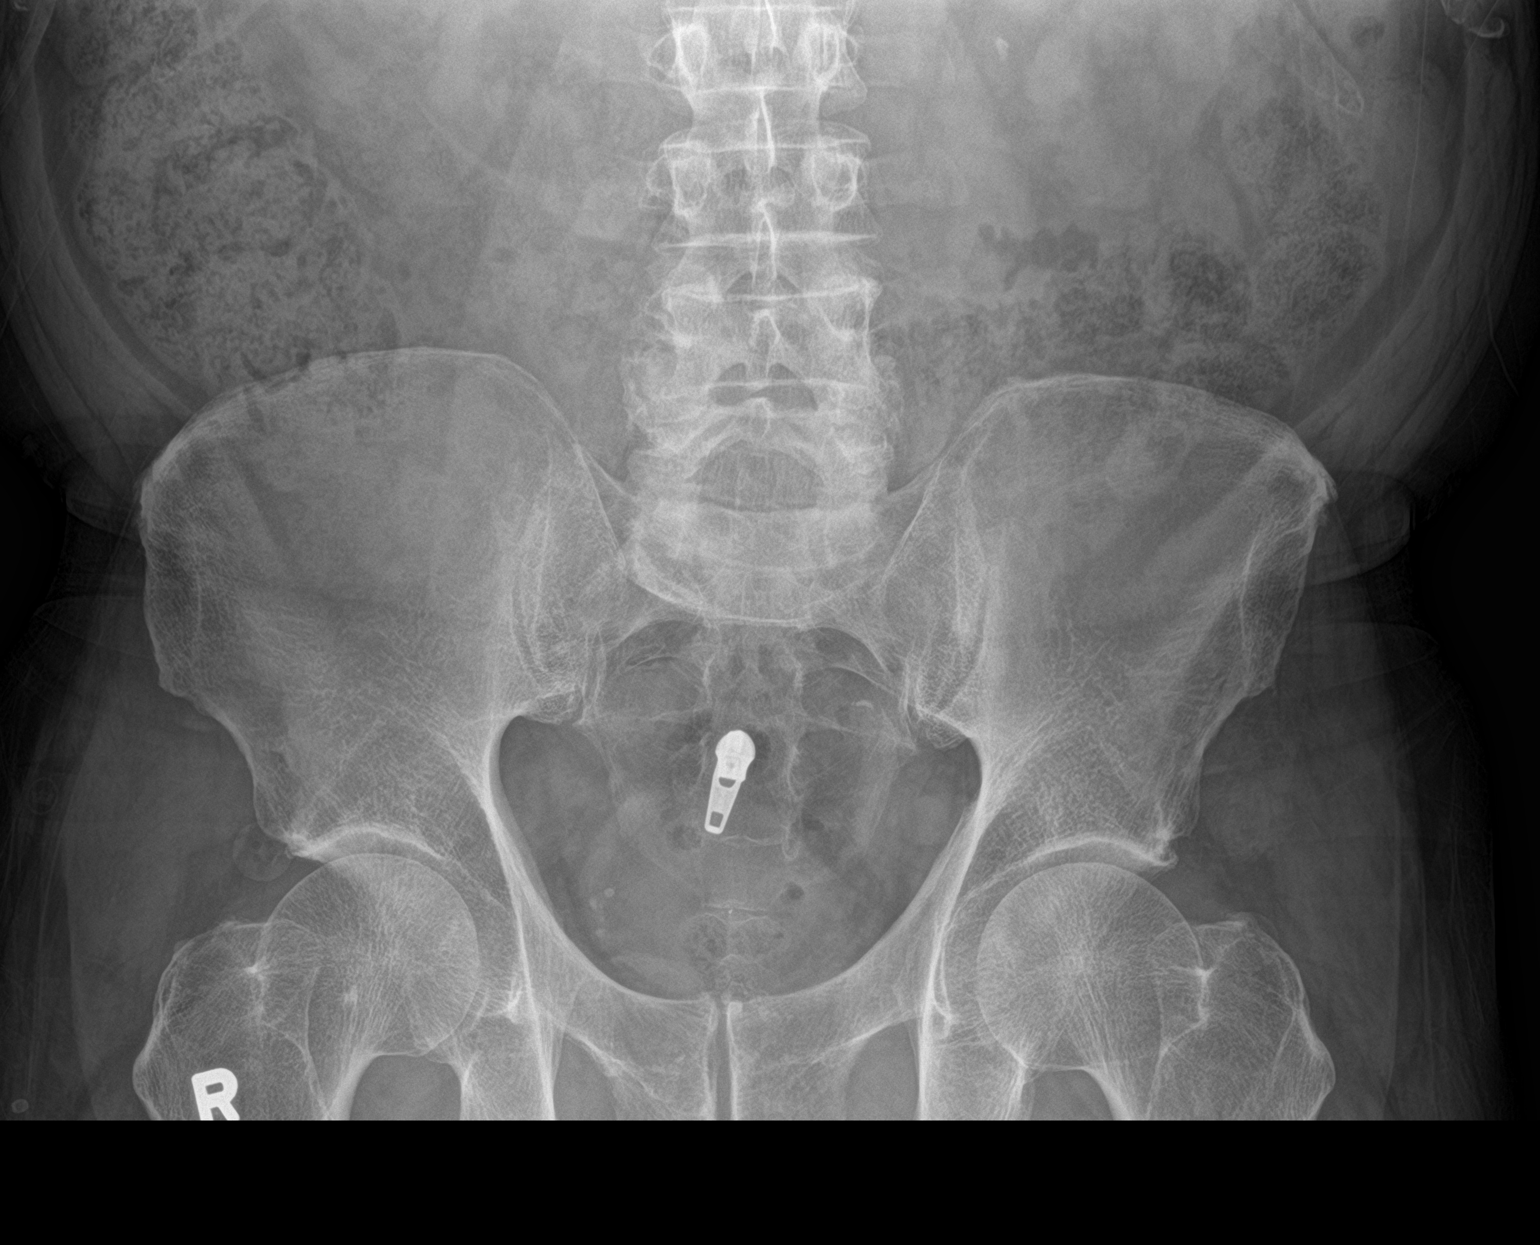

[2 of 2 positions shown; findings below may reference images not displayed]

FINDINGS: The bowel gas pattern is normal. 6 mm calculus is seen to left of L2
vertebral body which corresponds roughly to location of left renal
pelvic calcification noted on prior CT scan. Phleboliths are noted
in the pelvis.
IMPRESSION: Left renal calculus is noted. No abnormal bowel gas pattern is
noted.

## 2022-07-13 ENCOUNTER — Encounter: Payer: Self-pay | Admitting: Family Medicine

## 2022-07-31 ENCOUNTER — Other Ambulatory Visit: Payer: Self-pay | Admitting: Cardiology

## 2022-07-31 DIAGNOSIS — I2510 Atherosclerotic heart disease of native coronary artery without angina pectoris: Secondary | ICD-10-CM

## 2022-08-30 ENCOUNTER — Ambulatory Visit: Payer: Medicare HMO | Admitting: Cardiology

## 2022-08-30 ENCOUNTER — Encounter: Payer: Self-pay | Admitting: Cardiology

## 2022-08-30 VITALS — BP 136/83 | HR 58 | Resp 16 | Ht 68.0 in | Wt 167.0 lb

## 2022-08-30 DIAGNOSIS — I10 Essential (primary) hypertension: Secondary | ICD-10-CM | POA: Diagnosis not present

## 2022-08-30 DIAGNOSIS — I2510 Atherosclerotic heart disease of native coronary artery without angina pectoris: Secondary | ICD-10-CM | POA: Diagnosis not present

## 2022-08-30 MED ORDER — ASPIRIN 81 MG PO TBEC: DELAYED_RELEASE_TABLET | ORAL | 3 refills | Status: DC

## 2022-08-30 MED ORDER — METOPROLOL SUCCINATE ER 25 MG PO TB24: ORAL_TABLET | Freq: Every day | ORAL | 3 refills | Status: DC

## 2022-08-30 MED ORDER — NITROGLYCERIN 0.4 MG SL SUBL: SUBLINGUAL_TABLET | SUBLINGUAL | 3 refills | Status: DC | PRN

## 2022-08-30 MED ORDER — LOSARTAN POTASSIUM 25 MG PO TABS: ORAL_TABLET | Freq: Every day | ORAL | 3 refills | Status: DC

## 2022-08-30 MED ORDER — ATORVASTATIN CALCIUM 80 MG PO TABS: ORAL_TABLET | Freq: Every day | ORAL | 3 refills | Status: DC

## 2022-08-30 NOTE — Progress Notes (Signed)
Patient is here for follow up visit.  Subjective:   '@Patient'$  ID: Dale Gonzalez, male    DOB: 1951-01-10, 72 y.o.   MRN: [id number]   Chief Complaint  Patient presents with   Follow-up    1 year   Tachycardia    HPI  72 year old Caucasian with coronary artery disease (STEMI and staged multivessel PCI 07/2017).  Patient is doing very well. He walks on average 6 miles/day. He denies chest pain, shortness of breath, palpitations, leg edema, orthopnea, PND, TIA/syncope.     Current Outpatient Medications:    aspirin EC (ASPIRIN LOW DOSE) 81 MG tablet, TAKE 1 TABLET BY MOUTH DAILY - SWALLOW WHOLE, Disp: 120 tablet, Rfl: 2   atorvastatin (LIPITOR) 80 MG tablet, TAKE 1 TABLET (80 MG TOTAL) BY MOUTH DAILY., Disp: 90 tablet, Rfl: 3   EPINEPHrine 0.3 mg/0.3 mL IJ SOAJ injection, Inject 0.3 mg into the muscle as needed for anaphylaxis (Bee stings)., Disp: 2 each, Rfl: 5   fluoruracil (FLUOROPLEX) 1 % cream, , Disp: , Rfl:    losartan (COZAAR) 25 MG tablet, TAKE 1 TABLET (25 MG TOTAL) BY MOUTH DAILY., Disp: 90 tablet, Rfl: 3   metoprolol succinate (TOPROL-XL) 25 MG 24 hr tablet, TAKE 1 TABLET (25 MG TOTAL) BY MOUTH DAILY., Disp: 90 tablet, Rfl: 3   nitroGLYCERIN (NITROSTAT) 0.4 MG SL tablet, PLACE 1 TABLET (0.4 MG TOTAL) UNDER THE TONGUE EVERY 5 (FIVE) MINUTES AS NEEDED FOR CHEST PAIN., Disp: 25 tablet, Rfl: 3  Cardiovascular studies:   EKG 08/30/2022: Sinus rhythm 58 bpm Diffuse low voltage Left axis -anterior fascicular block Old Inferior -lateral and Old Inferior infarct  Exercise sestamibi stress test 10/03/2018:  1. The patient performed treadmill exercise using Bruce protocol, completing 10:19 minutes. The patient completed an estimated workload of 12.3 METS, reaching 87% of the maximum predicted heart rate. Exercise capacity was excellent. Hemodynamic response was normal. Stress symptoms included fatigue.  Stress electrocardiogram demonstrated normal sinus rhythm, normal  resting conduction, old inferior infarct, no resting arrhythmias, and normal rest repolarization. No ischemic changes seen on stress electrocardiogram.  2. The overall quality of the study is good.  Left ventricular cavity is noted to be normal on the rest and stress studies.  Gated SPECT imaging demonstrates hypokinesis of the basal inferolateral and mid inferolateral myocardial wall(s).  The left ventricular ejection fraction was calculated or visually estimated to be 47%.  SPECT images show medium sized, mild intensity perfusion defect with minimal reversibility. Findings suggest old infarct in LCx territory with no significant ischemia.  3. Intermediate risk study.  Coronary intervention 08/08/2017:   Complex LAD/DIag bifurcation PCI Diag 2.5 X 16 mm Synergy DES LAD 2.75 X 38 mm & 3.5 X 20 mm Synergy DES Known residual moderate disease in small caliber RCA OM1 overlapping stents Synergy 2.25 x 38 mm and 2.25 x 12 mm (06/2017 in [facility] echocardiogram 08/09/2017:  - Left ventricle: The cavity size was normal. The estimated   ejection fraction was in the range of 45% to 50%. Mild   inferolateral hypokinesis. - Mitral valve: There was mild regurgitation. - Normal right atrial pressure.  Recent labs: 01/26/2022: Glucose 92, BUN/Cr 18/0.85. EGFR 87. Na/K 138/4.4. Rest of the CMP normal H/H 14/43. MCV 90. Platelets 199 Chol 94, TG 84, HDL 35, LDL 41  Review of Systems  Cardiovascular:  Negative for chest pain, dyspnea on exertion, leg swelling, palpitations and syncope.        Objective:  Vitals:   08/30/22 1000 08/30/22 1008  BP: (!) 140/81 136/83  Pulse: (!) 58 (!) 58  Resp: 16   SpO2: 97% 97%    Physical Exam Vitals and nursing note reviewed.  Constitutional:      Appearance: He is well-developed.  Neck:     Vascular: No JVD.  Cardiovascular:     Rate and Rhythm: Normal rate and regular rhythm.     Pulses: Intact distal pulses.     Heart sounds: Normal  heart sounds. No murmur heard. Pulmonary:     Effort: Pulmonary effort is normal.     Breath sounds: Normal breath sounds. No wheezing or rales.         Assessment & Recommendations:   72 year-old Caucasian with coronary artery disease (STEMI and staged multivessel PCI 07/2017).  CAD: No angina symptoms with excellent baseline functional capacity. Continue Aspirin 81 mg ,losartan 25 mg, metoprolol succinate 25 mg, Lipitor 80 mg daily.  Lipids very well controlled.  Hypertension: Well controlled  F/u in 1 year   Nigel Mormon, MD Pager: [phone] Office: [phone]

## 2022-09-14 ENCOUNTER — Telehealth: Payer: Self-pay | Admitting: *Deleted

## 2022-09-14 NOTE — Patient Outreach (Signed)
  Care Coordination   Initial Visit Note   09/14/2022 Name: Dale Gonzalez MRN: [id number] DOB: 1951-01-06  Dale Gonzalez is a 72 y.o. year old male who sees Yong Channel, Brayton Mars, MD for primary care. I spoke with  Dale Gonzalez by phone today.  What matters to the patients health and wellness today?  No needs    Goals Addressed             This Visit's Progress    COMPLETED: care coordination activity       Care Coordination Interventions: Reviewed medications with patient and discussed adherence with no needed refills Reviewed scheduled/upcoming provider appointments including sufficient transportation source Screening for signs and symptoms of depression related to chronic disease state  Assessed social determinant of health barriers Educated on care management services with no needs presented at this time.         SDOH assessments and interventions completed:  Yes     Care Coordination Interventions:  Yes, provided   Follow up plan: No further intervention required.   Encounter Outcome:  Pt. Visit Completed   Raina Mina, RN Care Management Coordinator [place] Office [phone]

## 2022-09-26 ENCOUNTER — Ambulatory Visit: Payer: Medicare HMO | Admitting: Podiatry

## 2022-09-26 ENCOUNTER — Ambulatory Visit (INDEPENDENT_AMBULATORY_CARE_PROVIDER_SITE_OTHER): Payer: Medicare HMO

## 2022-09-26 DIAGNOSIS — M7661 Achilles tendinitis, right leg: Secondary | ICD-10-CM | POA: Diagnosis not present

## 2022-09-26 DIAGNOSIS — M7660 Achilles tendinitis, unspecified leg: Secondary | ICD-10-CM

## 2022-09-26 MED ORDER — NITROGLYCERIN 0.2 MG/HR TD PT24: 0.2000 mg | MEDICATED_PATCH | Freq: Every day | TRANSDERMAL | 12 refills | Status: DC

## 2022-09-27 DIAGNOSIS — N200 Calculus of kidney: Secondary | ICD-10-CM | POA: Diagnosis not present

## 2022-09-27 DIAGNOSIS — Z87442 Personal history of urinary calculi: Secondary | ICD-10-CM | POA: Diagnosis not present

## 2022-09-28 NOTE — Progress Notes (Signed)
Subjective:   Patient ID: Dale Gonzalez, male   DOB: 72 y.o.   MRN: [id number]   HPI Patient presents stating Achilles pain right and that it has been going on for around a month and he does try to be very active and this has been inhibiting that from happening   ROS      Objective:  Physical Exam  Neurovascular status intact moderate equinus condition noted with exquisite discomfort in the muscle tendon junction of the right Achilles tendon with some thickness of the tendon no indication of tear with muscle strength normal     Assessment:  Possibility for a partial tear of the Achilles tendon right versus nodular formation or just inflammatory condition     Plan:  H&P x-rays reviewed and I went ahead today and I placed an air fracture walker that was properly fitted to his lower leg that he needs to start immediately and we will start with this along with nitro patches and gentle stretching and ice therapy and if symptoms not improving will require MRI  X-rays were negative for signs of bone spurring formation or other pathology from that perspective

## 2022-10-03 DIAGNOSIS — L820 Inflamed seborrheic keratosis: Secondary | ICD-10-CM | POA: Diagnosis not present

## 2022-10-08 ENCOUNTER — Ambulatory Visit: Payer: Medicare HMO | Admitting: Podiatry

## 2022-10-08 DIAGNOSIS — M7660 Achilles tendinitis, unspecified leg: Secondary | ICD-10-CM | POA: Diagnosis not present

## 2022-10-08 NOTE — Progress Notes (Signed)
Subjective:   Patient ID: Dale Gonzalez, male   DOB: 72 y.o.   MRN: [id number]   HPI Patient states he is doing much better with surgery he is very happy and he still is using his boot and getting ready to go to [facility] to play golf   ROS      Objective:  Physical Exam  Ocular status intact patient's right posterior Achilles is improving there is still some nodular formation but it is not as sore as it was at previous visit     Assessment:  Patient is doing well post immobilization for mid substance Achilles tendinitis right     Plan:  Spent a great deal time educating him on this and recommended at this time complete immobilization that gradually be reduced over the next couple weeks continue nitro patches continue stretching ice and dispensed heel lifts with all questions answered and may require more aggressive treatment but hopefully this will knock this pathology out

## 2022-10-18 ENCOUNTER — Telehealth: Payer: Medicare HMO | Admitting: Family Medicine

## 2022-10-18 DIAGNOSIS — U071 COVID-19: Secondary | ICD-10-CM

## 2022-10-18 MED ORDER — MOLNUPIRAVIR EUA 200MG CAPSULE: 4.0000 | ORAL_CAPSULE | Freq: Two times a day (BID) | ORAL | 0 refills | Status: AC

## 2022-10-18 MED ORDER — BENZONATATE 100 MG PO CAPS: 100.0000 mg | ORAL_CAPSULE | Freq: Two times a day (BID) | ORAL | 0 refills | Status: DC | PRN

## 2022-10-18 NOTE — Patient Instructions (Signed)
Dale Gonzalez, thank you for joining Perlie Mayo, NP for today's virtual visit.  While this provider is not your primary care provider (PCP), if your PCP is located in our provider database this encounter information will be shared with them immediately following your visit.   [facility] account gives you access to today's visit and all your visits, tests, and labs performed at [facility] " click here if you don't have a Dale Gonzalez account or go to [email or web address]  Consent: (Patient) Dale Gonzalez provided verbal consent for this virtual visit at the beginning of the encounter.  Current Medications:  Current Outpatient Medications:    benzonatate (TESSALON) 100 MG capsule, Take 1 capsule (100 mg total) by mouth 2 (two) times daily as needed for cough., Disp: 20 capsule, Rfl: 0   molnupiravir EUA (LAGEVRIO) 200 mg CAPS capsule, Take 4 capsules (800 mg total) by mouth 2 (two) times daily for 5 days., Disp: 40 capsule, Rfl: 0   aspirin EC (ASPIRIN LOW DOSE) 81 MG tablet, TAKE 1 TABLET BY MOUTH DAILY - SWALLOW WHOLE, Disp: 90 tablet, Rfl: 3   atorvastatin (LIPITOR) 80 MG tablet, TAKE 1 TABLET (80 MG TOTAL) BY MOUTH DAILY., Disp: 90 tablet, Rfl: 3   EPINEPHrine 0.3 mg/0.3 mL IJ SOAJ injection, Inject 0.3 mg into the muscle as needed for anaphylaxis (Bee stings)., Disp: 2 each, Rfl: 5   fluoruracil (FLUOROPLEX) 1 % cream, , Disp: , Rfl:    losartan (COZAAR) 25 MG tablet, TAKE 1 TABLET (25 MG TOTAL) BY MOUTH DAILY., Disp: 90 tablet, Rfl: 3   metoprolol succinate (TOPROL-XL) 25 MG 24 hr tablet, TAKE 1 TABLET (25 MG TOTAL) BY MOUTH DAILY., Disp: 90 tablet, Rfl: 3   nitroGLYCERIN (NITRO-DUR) 0.2 mg/hr patch, Place 1 patch (0.2 mg total) onto the skin daily., Disp: 30 patch, Rfl: 12   nitroGLYCERIN (NITROSTAT) 0.4 MG SL tablet, PLACE 1 TABLET (0.4 MG TOTAL) UNDER THE TONGUE EVERY 5 (FIVE) MINUTES AS NEEDED FOR CHEST PAIN., Disp: 25 tablet, Rfl: 3    Medications ordered in this encounter:  Meds ordered this encounter  Medications   molnupiravir EUA (LAGEVRIO) 200 mg CAPS capsule    Sig: Take 4 capsules (800 mg total) by mouth 2 (two) times daily for 5 days.    Dispense:  40 capsule    Refill:  0    Order Specific Question:   Supervising Provider    Answer:   Chase Picket [id number]   benzonatate (TESSALON) 100 MG capsule    Sig: Take 1 capsule (100 mg total) by mouth 2 (two) times daily as needed for cough.    Dispense:  20 capsule    Refill:  0    Order Specific Question:   Supervising Provider    Answer:   Chase Picket [id number]     *If you need refills on other medications prior to your next appointment, please contact your pharmacy*  Follow-Up: Call back or seek an in-person evaluation if the symptoms worsen or if the condition fails to improve as anticipated.  [place] [phone]  Care Instructions:  Continue OTC symptomatic management of choice  - Take as prescribed  - Push fluids - Rest as needed  Isolation Instructions: You are to isolate at home for 5 days from onset of your symptoms. If you must be around other household members who do not have symptoms, you need to make sure that both you and the  family members are masking consistently with a high-quality mask.  After day 5 of isolation, if you have had no fever within 24 hours and you are feeling better, you can end isolation but need to mask for an additional 5 days.  After day 5 if you have a fever or are having significant symptoms, please isolate for full 10 days.  If you note any worsening of symptoms despite treatment, please seek an in-person evaluation ASAP. If you note any significant shortness of breath or any chest pain, please seek ER evaluation. Please do not delay care!   COVID-19: What to Do if You Are Sick If you test positive and are an older adult or someone who is at high risk of getting very sick from  COVID-19, treatment may be available. Contact a healthcare provider right away after a positive test to determine if you are eligible, even if your symptoms are mild right now. You can also visit a Test to Treat location and, if eligible, receive a prescription from a provider. Don't delay: Treatment must be started within the first few days to be effective. If you have a fever, cough, or other symptoms, you might have COVID-19. Most people have mild illness and are able to recover at home. If you are sick: Keep track of your symptoms. If you have an emergency warning sign (including trouble breathing), call 911. Steps to help prevent the spread of COVID-19 if you are sick If you are sick with COVID-19 or think you might have COVID-19, follow the steps below to care for yourself and to help protect other people in your home and community. Stay home except to get medical care Stay home. Most people with COVID-19 have mild illness and can recover at home without medical care. Do not leave your home, except to get medical care. Do not visit public areas and do not go to places where you are unable to wear a mask. Take care of yourself. Get rest and stay hydrated. Take over-the-counter medicines, such as acetaminophen, to help you feel better. Stay in touch with your doctor. Call before you get medical care. Be sure to get care if you have trouble breathing, or have any other emergency warning signs, or if you think it is an emergency. Avoid public transportation, ride-sharing, or taxis if possible. Get tested If you have symptoms of COVID-19, get tested. While waiting for test results, stay away from others, including staying apart from those living in your household. Get tested as soon as possible after your symptoms start. Treatments may be available for people with COVID-19 who are at risk for becoming very sick. Don't delay: Treatment must be started early to be effective--some treatments must begin  within 5 days of your first symptoms. Contact your healthcare provider right away if your test result is positive to determine if you are eligible. Self-tests are one of several options for testing for the virus that causes COVID-19 and may be more convenient than laboratory-based tests and point-of-care tests. Ask your healthcare provider or your local health department if you need help interpreting your test results. You can visit your state, tribal, local, and territorial health department's website to look for the latest local information on testing sites. Separate yourself from other people As much as possible, stay in a specific room and away from other people and pets in your home. If possible, you should use a separate bathroom. If you need to be around other people or animals in or  outside of the home, wear a well-fitting mask. Tell your close contacts that they may have been exposed to COVID-19. An infected person can spread COVID-19 starting 48 hours (or 2 days) before the person has any symptoms or tests positive. By letting your close contacts know they may have been exposed to COVID-19, you are helping to protect everyone. See COVID-19 and Animals if you have questions about pets. If you are diagnosed with COVID-19, someone from the health department may call you. Answer the call to slow the spread. Monitor your symptoms Symptoms of COVID-19 include fever, cough, or other symptoms. Follow care instructions from your healthcare provider and local health department. Your local health authorities may give instructions on checking your symptoms and reporting information. When to seek emergency medical attention Look for emergency warning signs* for COVID-19. If someone is showing any of these signs, seek emergency medical care immediately: Trouble breathing Persistent pain or pressure in the chest New confusion Inability to wake or stay awake Pale, gray, or blue-colored skin, lips, or nail  beds, depending on skin tone *This list is not all possible symptoms. Please call your medical provider for any other symptoms that are severe or concerning to you. Call 911 or call ahead to your local emergency facility: Notify the operator that you are seeking care for someone who has or may have COVID-19. Call ahead before visiting your doctor Call ahead. Many medical visits for routine care are being postponed or done by phone or telemedicine. If you have a medical appointment that cannot be postponed, call your doctor's office, and tell them you have or may have COVID-19. This will help the office protect themselves and other patients. If you are sick, wear a well-fitting mask You should wear a mask if you must be around other people or animals, including pets (even at home). Wear a mask with the best fit, protection, and comfort for you. You don't need to wear the mask if you are alone. If you can't put on a mask (because of trouble breathing, for example), cover your coughs and sneezes in some other way. Try to stay at least 6 feet away from other people. This will help protect the people around you. Masks should not be placed on young children under age 41 years, anyone who has trouble breathing, or anyone who is not able to remove the mask without help. Cover your coughs and sneezes Cover your mouth and nose with a tissue when you cough or sneeze. Throw away used tissues in a lined trash can. Immediately wash your hands with soap and water for at least 20 seconds. If soap and water are not available, clean your hands with an alcohol-based hand sanitizer that contains at least 60% alcohol. Clean your hands often Wash your hands often with soap and water for at least 20 seconds. This is especially important after blowing your nose, coughing, or sneezing; going to the bathroom; and before eating or preparing food. Use hand sanitizer if soap and water are not available. Use an alcohol-based hand  sanitizer with at least 60% alcohol, covering all surfaces of your hands and rubbing them together until they feel dry. Soap and water are the best option, especially if hands are visibly dirty. Avoid touching your eyes, nose, and mouth with unwashed hands. Handwashing Tips Avoid sharing personal household items Do not share dishes, drinking glasses, cups, eating utensils, towels, or bedding with other people in your home. Wash these items thoroughly after using them with  soap and water or put in the dishwasher. Clean surfaces in your home regularly Clean and disinfect high-touch surfaces (for example, doorknobs, tables, handles, light switches, and countertops) in your "sick room" and bathroom. In shared spaces, you should clean and disinfect surfaces and items after each use by the person who is ill. If you are sick and cannot clean, a caregiver or other person should only clean and disinfect the area around you (such as your bedroom and bathroom) on an as needed basis. Your caregiver/other person should wait as long as possible (at least several hours) and wear a mask before entering, cleaning, and disinfecting shared spaces that you use. Clean and disinfect areas that may have blood, stool, or body fluids on them. Use household cleaners and disinfectants. Clean visible dirty surfaces with household cleaners containing soap or detergent. Then, use a household disinfectant. Use a product from [employer] List N: Disinfectants for Coronavirus ([id number]). Be sure to follow the instructions on the label to ensure safe and effective use of the product. Many products recommend keeping the surface wet with a disinfectant for a certain period of time (look at "contact time" on the product label). You may also need to wear personal protective equipment, such as gloves, depending on the directions on the product label. Immediately after disinfecting, wash your hands with soap and water for 20 seconds. For  completed guidance on cleaning and disinfecting your home, visit Complete Disinfection Guidance. Take steps to improve ventilation at home Improve ventilation (air flow) at home to help prevent from spreading COVID-19 to other people in your household. Clear out COVID-19 virus particles in the air by opening windows, using air filters, and turning on fans in your home. Use this interactive tool to learn how to improve air flow in your home. When you can be around others after being sick with COVID-19 Deciding when you can be around others is different for different situations. Find out when you can safely end home isolation. For any additional questions about your care, contact your healthcare provider or state or local health department. 11/08/2020 Content source: [facility] ([facility]), Division of Viral Diseases This information is not intended to replace advice given to you by your health care provider. Make sure you discuss any questions you have with your health care provider. Document Revised: 12/22/2020 Document Reviewed: 12/22/2020 Elsevier Patient Education  2022 [employer].       If you have been instructed to have an in-person evaluation today at a local Urgent Care facility, please use the link below. It will take you to a list of all of our available [facility], including address, phone number and hours of operation. Please do not delay care.  [facility]  If you or a family member do not have a primary care provider, use the link below to schedule a visit and establish care. When you choose a [facility] primary care physician or advanced practice provider, you gain a long-term partner in health. Find a Primary Care Provider  Learn more about [facility]'s in-office and virtual care options: [facility]

## 2022-10-18 NOTE — Progress Notes (Signed)
Virtual Visit Consent   Dale Gonzalez, you are scheduled for a virtual visit with a [facility] provider today. Just as with appointments in the office, your consent must be obtained to participate. Your consent will be active for this visit and any virtual visit you may have with one of our providers in the next 365 days. If you have a MyChart account, a copy of this consent can be sent to you electronically.  As this is a virtual visit, video technology does not allow for your provider to perform a traditional examination. This may limit your provider's ability to fully assess your condition. If your provider identifies any concerns that need to be evaluated in person or the need to arrange testing (such as labs, EKG, etc.), we will make arrangements to do so. Although advances in technology are sophisticated, we cannot ensure that it will always work on either your end or our end. If the connection with a video visit is poor, the visit may have to be switched to a telephone visit. With either a video or telephone visit, we are not always able to ensure that we have a secure connection.  By engaging in this virtual visit, you consent to the provision of healthcare and authorize for your insurance to be billed (if applicable) for the services provided during this visit. Depending on your insurance coverage, you may receive a charge related to this service.  I need to obtain your verbal consent now. Are you willing to proceed with your visit today? NAHOME HILLIER has provided verbal consent on 10/18/2022 for a virtual visit (video or telephone). Perlie Mayo, NP  Date: 10/18/2022 2:04 PM  Virtual Visit via Video Note   I, Perlie Mayo, connected with  DIEDRICH IACONO  ([id number], 1951-07-04) on 10/18/22 at  2:00 PM EST by a video-enabled telemedicine application and verified that I am speaking with the correct person using two identifiers.  Location: Patient: Virtual Visit Location Patient:  Home Provider: Virtual Visit Location Provider: Home Office   I discussed the limitations of evaluation and management by telemedicine and the availability of in person appointments. The patient expressed understanding and agreed to proceed.    History of Present Illness: Dale Gonzalez is a 72 y.o. who identifies as a male who was assigned male at birth, and is being seen today for COVID URI 2 weeks ago- did not test for COVID -runny nose improved Onset was yesterday started with cough that progressive got worse- kinda of hit quickly-  Associated symptoms are cough, balance instability, fatigue, headaches, congestion- in chest mostly. Modifying factors are none Denies chest pain, shortness of breath, fevers, chills  Exposure to sick contacts- unknown- was at the gym COVID test: positive today Vaccines: yes, unsure of 2023 booster   Problems:  Patient Active Problem List   Diagnosis Date Noted   Essential hypertension 08/30/2022   Dupuytren's contracture of left hand 05/04/2021   GERD (gastroesophageal reflux disease) 08/04/2019   Coronary artery disease s/p PTCA x 5 in 07/2017 10/10/2018   Elevated blood pressure reading without diagnosis of hypertension 05/18/2014   Nonspecific abnormal results of thyroid function study 01/12/2014   Exercise-induced asthma primarily cold induced 07/24/2013   Skin cancer, basal cell 12/27/2011   Microscopic hematuria 12/27/2011   Allergic rhinitis 12/19/2007   Dyslipidemia 12/18/2006    Allergies:  Allergies  Allergen Reactions   Bee Venom Anaphylaxis and Swelling    Swelling of the throat  Medications:  Current Outpatient Medications:    aspirin EC (ASPIRIN LOW DOSE) 81 MG tablet, TAKE 1 TABLET BY MOUTH DAILY - SWALLOW WHOLE, Disp: 90 tablet, Rfl: 3   atorvastatin (LIPITOR) 80 MG tablet, TAKE 1 TABLET (80 MG TOTAL) BY MOUTH DAILY., Disp: 90 tablet, Rfl: 3   EPINEPHrine 0.3 mg/0.3 mL IJ SOAJ injection, Inject 0.3 mg into the muscle as  needed for anaphylaxis (Bee stings)., Disp: 2 each, Rfl: 5   fluoruracil (FLUOROPLEX) 1 % cream, , Disp: , Rfl:    losartan (COZAAR) 25 MG tablet, TAKE 1 TABLET (25 MG TOTAL) BY MOUTH DAILY., Disp: 90 tablet, Rfl: 3   metoprolol succinate (TOPROL-XL) 25 MG 24 hr tablet, TAKE 1 TABLET (25 MG TOTAL) BY MOUTH DAILY., Disp: 90 tablet, Rfl: 3   nitroGLYCERIN (NITRO-DUR) 0.2 mg/hr patch, Place 1 patch (0.2 mg total) onto the skin daily., Disp: 30 patch, Rfl: 12   nitroGLYCERIN (NITROSTAT) 0.4 MG SL tablet, PLACE 1 TABLET (0.4 MG TOTAL) UNDER THE TONGUE EVERY 5 (FIVE) MINUTES AS NEEDED FOR CHEST PAIN., Disp: 25 tablet, Rfl: 3  Observations/Objective: Patient is well-developed, well-nourished in no acute distress.  Resting comfortably  at home.  Head is normocephalic, atraumatic.  No labored breathing.  Speech is clear and coherent with logical content.  Patient is alert and oriented at baseline.    Assessment and Plan:  1. COVID-19  - molnupiravir EUA (LAGEVRIO) 200 mg CAPS capsule; Take 4 capsules (800 mg total) by mouth 2 (two) times daily for 5 days.  Dispense: 40 capsule; Refill: 0 - benzonatate (TESSALON) 100 MG capsule; Take 1 capsule (100 mg total) by mouth 2 (two) times daily as needed for cough.  Dispense: 20 capsule; Refill: 0  - Continue OTC symptomatic management of choice  - Take as prescribed  - Push fluids - Rest as needed - Discussed return precautions and when to seek in-person evaluation, sent via AVS as well   Reviewed side effects, risks and benefits of medication.    Patient acknowledged agreement and understanding of the plan.   Past Medical, Surgical, Social History, Allergies, and Medications have been Reviewed.     Follow Up Instructions: I discussed the assessment and treatment plan with the patient. The patient was provided an opportunity to ask questions and all were answered. The patient agreed with the plan and demonstrated an understanding of the  instructions.  A copy of instructions were sent to the patient via MyChart unless otherwise noted below.     The patient was advised to call back or seek an in-person evaluation if the symptoms worsen or if the condition fails to improve as anticipated.  Time:  I spent 10 minutes with the patient via telehealth technology discussing the above problems/concerns.    Perlie Mayo, NP

## 2022-11-05 DIAGNOSIS — R69 Illness, unspecified: Secondary | ICD-10-CM | POA: Diagnosis not present

## 2022-11-06 DIAGNOSIS — R69 Illness, unspecified: Secondary | ICD-10-CM | POA: Diagnosis not present

## 2022-11-20 DIAGNOSIS — D225 Melanocytic nevi of trunk: Secondary | ICD-10-CM | POA: Diagnosis not present

## 2022-11-20 DIAGNOSIS — L821 Other seborrheic keratosis: Secondary | ICD-10-CM | POA: Diagnosis not present

## 2022-11-20 DIAGNOSIS — Z85828 Personal history of other malignant neoplasm of skin: Secondary | ICD-10-CM | POA: Diagnosis not present

## 2022-11-20 DIAGNOSIS — L570 Actinic keratosis: Secondary | ICD-10-CM | POA: Diagnosis not present

## 2022-11-20 DIAGNOSIS — L723 Sebaceous cyst: Secondary | ICD-10-CM | POA: Diagnosis not present

## 2022-11-29 DIAGNOSIS — M25579 Pain in unspecified ankle and joints of unspecified foot: Secondary | ICD-10-CM | POA: Diagnosis not present

## 2022-12-13 DIAGNOSIS — M25579 Pain in unspecified ankle and joints of unspecified foot: Secondary | ICD-10-CM | POA: Diagnosis not present

## 2022-12-28 DIAGNOSIS — M25579 Pain in unspecified ankle and joints of unspecified foot: Secondary | ICD-10-CM | POA: Diagnosis not present

## 2022-12-30 DIAGNOSIS — M25579 Pain in unspecified ankle and joints of unspecified foot: Secondary | ICD-10-CM | POA: Diagnosis not present

## 2023-01-03 DIAGNOSIS — M25579 Pain in unspecified ankle and joints of unspecified foot: Secondary | ICD-10-CM | POA: Diagnosis not present

## 2023-01-04 DIAGNOSIS — M25579 Pain in unspecified ankle and joints of unspecified foot: Secondary | ICD-10-CM | POA: Diagnosis not present

## 2023-01-07 DIAGNOSIS — M25579 Pain in unspecified ankle and joints of unspecified foot: Secondary | ICD-10-CM | POA: Diagnosis not present

## 2023-01-10 DIAGNOSIS — M25579 Pain in unspecified ankle and joints of unspecified foot: Secondary | ICD-10-CM | POA: Diagnosis not present

## 2023-01-15 DIAGNOSIS — M25579 Pain in unspecified ankle and joints of unspecified foot: Secondary | ICD-10-CM | POA: Diagnosis not present

## 2023-01-18 DIAGNOSIS — M25579 Pain in unspecified ankle and joints of unspecified foot: Secondary | ICD-10-CM | POA: Diagnosis not present

## 2023-01-21 DIAGNOSIS — M25579 Pain in unspecified ankle and joints of unspecified foot: Secondary | ICD-10-CM | POA: Diagnosis not present

## 2023-01-24 DIAGNOSIS — M25579 Pain in unspecified ankle and joints of unspecified foot: Secondary | ICD-10-CM | POA: Diagnosis not present

## 2023-01-29 ENCOUNTER — Encounter: Payer: Self-pay | Admitting: Family Medicine

## 2023-01-29 ENCOUNTER — Ambulatory Visit (INDEPENDENT_AMBULATORY_CARE_PROVIDER_SITE_OTHER): Payer: Medicare HMO | Admitting: Family Medicine

## 2023-01-29 VITALS — BP 108/70 | HR 55 | Temp 97.5°F | Ht 68.0 in | Wt 167.5 lb

## 2023-01-29 DIAGNOSIS — R3129 Other microscopic hematuria: Secondary | ICD-10-CM

## 2023-01-29 DIAGNOSIS — M25579 Pain in unspecified ankle and joints of unspecified foot: Secondary | ICD-10-CM | POA: Diagnosis not present

## 2023-01-29 DIAGNOSIS — I2510 Atherosclerotic heart disease of native coronary artery without angina pectoris: Secondary | ICD-10-CM | POA: Diagnosis not present

## 2023-01-29 DIAGNOSIS — Z0000 Encounter for general adult medical examination without abnormal findings: Secondary | ICD-10-CM | POA: Diagnosis not present

## 2023-01-29 DIAGNOSIS — E785 Hyperlipidemia, unspecified: Secondary | ICD-10-CM

## 2023-01-29 DIAGNOSIS — Z125 Encounter for screening for malignant neoplasm of prostate: Secondary | ICD-10-CM | POA: Diagnosis not present

## 2023-01-29 DIAGNOSIS — R946 Abnormal results of thyroid function studies: Secondary | ICD-10-CM | POA: Diagnosis not present

## 2023-01-29 LAB — CBC WITH DIFFERENTIAL/PLATELET
Basophils Absolute: 0 10*3/uL (ref 0.0–0.1)
Basophils Relative: 0.6 % (ref 0.0–3.0)
Eosinophils Absolute: 0.2 10*3/uL (ref 0.0–0.7)
Eosinophils Relative: 2.6 % (ref 0.0–5.0)
HCT: 45.1 % (ref 39.0–52.0)
Hemoglobin: 14.8 g/dL (ref 13.0–17.0)
Lymphocytes Relative: 28 % (ref 12.0–46.0)
Lymphs Abs: 1.7 10*3/uL (ref 0.7–4.0)
MCHC: 32.8 g/dL (ref 30.0–36.0)
MCV: 90.6 fl (ref 78.0–100.0)
Monocytes Absolute: 0.6 10*3/uL (ref 0.1–1.0)
Monocytes Relative: 9.4 % (ref 3.0–12.0)
Neutro Abs: 3.6 10*3/uL (ref 1.4–7.7)
Neutrophils Relative %: 59.4 % (ref 43.0–77.0)
Platelets: 232 10*3/uL (ref 150.0–400.0)
RBC: 4.98 Mil/uL (ref 4.22–5.81)
RDW: 14 % (ref 11.5–15.5)
WBC: 6.1 10*3/uL (ref 4.0–10.5)

## 2023-01-29 LAB — PSA, MEDICARE: PSA: 0.7 ng/ml (ref 0.10–4.00)

## 2023-01-29 LAB — COMPREHENSIVE METABOLIC PANEL
ALT: 21 U/L (ref 0–53)
AST: 23 U/L (ref 0–37)
Albumin: 4.5 g/dL (ref 3.5–5.2)
Alkaline Phosphatase: 51 U/L (ref 39–117)
BUN: 19 mg/dL (ref 6–23)
CO2: 29 mEq/L (ref 19–32)
Calcium: 9.3 mg/dL (ref 8.4–10.5)
Chloride: 103 mEq/L (ref 96–112)
Creatinine, Ser: 0.98 mg/dL (ref 0.40–1.50)
GFR: 76.93 mL/min (ref >60.00)
Glucose, Bld: 94 mg/dL (ref 70–99)
Potassium: 4.6 mEq/L (ref 3.5–5.1)
Sodium: 138 mEq/L (ref 135–145)
Total Bilirubin: 0.7 mg/dL (ref 0.2–1.2)
Total Protein: 7 g/dL (ref 6.0–8.3)

## 2023-01-29 LAB — LIPID PANEL
Cholesterol: 105 mg/dL (ref 0–200)
HDL: 35.5 mg/dL — ABNORMAL LOW (ref >39.00)
LDL Cholesterol: 45 mg/dL (ref 0–99)
NonHDL: 69.18
Total CHOL/HDL Ratio: 3
Triglycerides: 119 mg/dL (ref 0.0–149.0)
VLDL: 23.8 mg/dL (ref 0.0–40.0)

## 2023-01-29 LAB — TSH: TSH: 5.08 u[IU]/mL (ref 0.35–5.50)

## 2023-01-29 NOTE — Progress Notes (Signed)
Phone: [phone]   Subjective:  Patient presents today for their annual physical. Chief complaint-noted.   See problem oriented charting- ROS- full  review of systems was completed and negative  except for: strained achilles since January- started with physical therapy and going in right direction- [place] with [facility] at [employer], mild brief cough before bed- no shortness of breath or anything  The following were reviewed and entered/updated in epic: Past Medical History:  Diagnosis Date   Allergy    Basal cell cancer    right cheek; ?left arm; Dr Karlyn Agee   Chicken pox    Coronary artery disease    Hyperlipidemia    LDL goal = <115   Myocardial infarction (HCC) 07/27/2017   RAD (reactive airway disease) 1998   cold induced cough @ [facility]   Seasonal allergies    Patient Active Problem List   Diagnosis Date Noted   Coronary artery disease s/p PTCA x 5 in 07/2017 10/10/2018    Priority: High   Essential hypertension 08/30/2022    Priority: Medium    Dupuytren's contracture of left hand 05/04/2021    Priority: Medium    Elevated blood pressure reading without diagnosis of hypertension 05/18/2014    Priority: Medium    Nonspecific abnormal results of thyroid function study 01/12/2014    Priority: Medium    Dyslipidemia 12/18/2006    Priority: Medium    GERD (gastroesophageal reflux disease) 08/04/2019    Priority: Low   Exercise-induced asthma primarily cold induced 07/24/2013    Priority: Low   Skin cancer, basal cell 12/27/2011    Priority: Low   Microscopic hematuria 12/27/2011    Priority: Low   Allergic rhinitis 12/19/2007    Priority: Low   Past Surgical History:  Procedure Laterality Date   COLONOSCOPY  2003 & 2013   negative; Dr Jarold Motto   CORONARY ANGIOPLASTY WITH STENT PLACEMENT  07/27/2017; 08/08/2017   CORONARY STENT INTERVENTION N/A 08/08/2017   Procedure: CORONARY STENT INTERVENTION;  Surgeon: Elder Negus, MD;  Location: [facility]  INVASIVE [facility];  Service: Cardiovascular;  Laterality: N/A;   CORONARY ULTRASOUND/IVUS N/A 08/08/2017   Procedure: Intravascular Ultrasound/IVUS;  Surgeon: Elder Negus, MD;  Location: [facility] IN[facility];  Service: Cardiovascular;  Laterality: N/A;   COSMETIC SURGERY  04/2019 & 08/2019   Blepharoplasty (Dr. Dimas Millin) both eyelids   dental implant     no sedation   EXTRACORPOREAL SHOCK WAVE LITHOTRIPSY Left 08/17/2021   Procedure: LEFT EXTRACORPOREAL SHOCK WAVE LITHOTRIPSY (ESWL);  Surgeon: Noel Christmas, MD;  Location: [facility];  Service: Urology;  Laterality: Left;   EYE SURGERY     right eye lid  dropping procedure 9/21   eyelid surgery     bilateral- did not work well for right eyelid    KIDNEY STONE SURGERY     LEFT HEART CATH AND CORONARY ANGIOGRAPHY N/A 08/08/2017   Procedure: LEFT HEART CATH AND CORONARY ANGIOGRAPHY;  Surgeon: Elder Negus, MD;  Location: [facility] IN[facility];  Service: Cardiovascular;  Laterality: N/A;   LITHOTRIPSY  08/17/2021   MOHS SURGERY     "right face; ?left arm"   ROOT CANAL  01/04/2020   Bilateral   TONSILLECTOMY     WRIST GANGLION EXCISION Left    Dr Meade Maw    Family History  Problem Relation Age of Onset   Breast cancer Mother    Cancer Mother         cervical lymph  nodes. mother has not shared primary cancer with him.    Heart disease Mother    Hypertension Mother    Hypertension Father    Heart disease Father        bypass surgery   Heart attack Father 75       Colostomy for ? diagnosis   COPD Father    Cancer Father        was not shared with patient primary cancer.    Hyperlipidemia Brother    Hypertension Brother    Hyperlipidemia Brother    Hypertension Brother    Post-traumatic stress disorder Son        Financial planner. disabled combat veteran.    Colon cancer Neg Hx    Esophageal cancer Neg Hx    Stomach cancer Neg Hx    Rectal cancer Neg Hx    Stroke Neg Hx    Diabetes Neg Hx     Colon polyps Neg Hx     Medications- reviewed and updated Current Outpatient Medications  Medication Sig Dispense Refill   aspirin EC (ASPIRIN LOW DOSE) 81 MG tablet TAKE 1 TABLET BY MOUTH DAILY - SWALLOW WHOLE 90 tablet 3   atorvastatin (LIPITOR) 80 MG tablet TAKE 1 TABLET (80 MG TOTAL) BY MOUTH DAILY. 90 tablet 3   EPINEPHrine 0.3 mg/0.3 mL IJ SOAJ injection Inject 0.3 mg into the muscle as needed for anaphylaxis (Bee stings). 2 each 5   fluoruracil (FLUOROPLEX) 1 % cream      losartan (COZAAR) 25 MG tablet TAKE 1 TABLET (25 MG TOTAL) BY MOUTH DAILY. 90 tablet 3   metoprolol succinate (TOPROL-XL) 25 MG 24 hr tablet TAKE 1 TABLET (25 MG TOTAL) BY MOUTH DAILY. 90 tablet 3   nitroGLYCERIN (NITROSTAT) 0.4 MG SL tablet PLACE 1 TABLET (0.4 MG TOTAL) UNDER THE TONGUE EVERY 5 (FIVE) MINUTES AS NEEDED FOR CHEST PAIN. (Patient not taking: Reported on 01/29/2023) 25 tablet 3   No current facility-administered medications for this visit.    Allergies-reviewed and updated Allergies  Allergen Reactions   Bee Venom Anaphylaxis and Swelling    Swelling of the throat    Social History   Social History Narrative   Married 45 years in 2021.  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    Works part time for his prior business- may stop in 2021- about 10 hours a week      Hobbies: enjoys spending time with his grandkids, regular exercise. Active in his church- [employer].    Objective  Objective:  BP 108/70   Pulse (!) 55   Temp (!) 97.5 F (36.4 C)   Ht 5\' 8"  (1.727 m)   Wt 167 lb 8 oz (76 kg)   SpO2 97%   BMI 25.47 kg/m  Gen: NAD, resting comfortably HEENT: Mucous membranes are moist. Oropharynx normal Neck: no thyromegaly CV: RRR no murmurs rubs or gallops Lungs: CTAB no crackles, wheeze, rhonchi Abdomen: soft/nontender/nondistended/normal bowel  sounds. No rebound or guarding.  Ext: no edema Skin: warm, dry Neuro: grossly normal, moves all extremities, PERRLA   Assessment and Plan  72 y.o. male presenting for annual physical.  Health Maintenance counseling: 1. Anticipatory guidance: Patient counseled regarding regular dental exams -q6 months, eye exams -yearly,  avoiding smoking and second hand smoke , limiting alcohol to 2 beverages per  day - if drinks 1 a week but many times not, no illicit drugs .   2. Risk factor reduction:  Advised patient of need for regular exercise and diet rich and fruits and vegetables to reduce risk of heart attack and stroke.  Exercise- 6-7 days a week still! Doing amazing.  Diet/weight management-sore throat stable in last year- reasonably clean diet.  Wt Readings from Last 3 Encounters:  01/29/23 167 lb 8 oz (76 kg)  08/30/22 167 lb (75.8 kg)  03/27/22 173 lb (78.5 kg)  3. Immunizations/screenings/ancillary studies- COVID vaccine - would recommend waiting until the fall as had COVID in february Immunization History  Administered Date(s) Administered   Fluad Quad(high Dose 65+) 05/17/2020, 05/01/2021, 06/21/2022   Influenza Whole 07/08/2007, 05/20/2008, 08/04/2008, 06/01/2010   Influenza,inj,Quad PF,6+ Mos 05/18/2014   Influenza-Unspecified 07/01/2017, 04/08/2019   PFIZER Comirnaty(Gray Top)Covid-19 Tri-Sucrose Vaccine 02/16/2021   PFIZER(Purple Top)SARS-COV-2 Vaccination 09/05/2019, 09/23/2019, 05/11/2020   PNEUMOCOCCAL CONJUGATE-20 05/01/2021   Pneumococcal Conjugate-13 08/02/2015, 03/27/2016   Td 12/24/2008   Tdap 02/18/2014   Zoster Recombinat (Shingrix) 07/15/2017, 10/17/2017   Zoster, Live 01/04/2011   4. Prostate cancer screening-  with overall good health he prefers to continue PSA rend though past age based screening recommendations  Lab Results  Component Value Date   PSA 0.50 01/26/2022   PSA 0.40 05/31/2021   PSA 1.48 05/01/2021   5. Colon cancer screening - History of tubular  adenoma-detected by Dr. Orvan Falconer August 2023 with 7-year follow-up 6. Skin cancer screening- Dr. Karlyn Agee yearly with history skin cancer. advised regular sunscreen use. Denies worrisome, changing, or new skin lesions- other than 1 spot he is working with dermatology on - on back of leg using topical cream 7. Smoking associated screening (lung cancer screening, AAA screen 65-75, UA)- never smoker 8. STD screening - only active with wife   Status of chronic or acute concerns   #Cad- follows with Dr. Luther Redo of [facility] times 12/29/2016 #hyperlipidemia #Hypertension S: Medication:aspirin 81 mg, atorvastatin 80 mg, losartan 25 mg, metoprolol 25 mg XR (losartan and metoprolol were started post MI) -no chest pain or shortness of breath  -nitroglycerin on hand- frequency of use: hasn't needed in years  Lab Results  Component Value Date   CHOL 94 01/26/2022   HDL 35.60 (L) 01/26/2022   LDLCALC 41 01/26/2022   TRIG 84.0 01/26/2022   CHOLHDL 3 01/26/2022  A/P: coronary artery disease asymptomatic continue current medications Lipids at goal- continue current medications as long as stable this year on labs Hypertension stable- continue current medicines   #Exercise-induced asthma primarily cold-induced-no recent issues still , masking cold weather is helpful- other than mild nighttime cough- reflux? Asthma? Allergens possible   #Dupuytren contracture bilaterally-has seen both Dr. Denyse Amass and Dr. Amanda Pea (2023) in the past- no need for surgery at this time   -Also has some arthritis right 5th finger- possible fusion in future- trying bandaid/taping but often doesn't need  #History of hematuria-has seen Dr. Logan Bores in the past-we will check urine microscopic  From Dr. Alwyn Ren notes "Urinalysis  reveals trace hematuria. In 2012 it revealed small to moderate microscopic hematuria. This was evaluated in the past  by Dr. Logan Bores, Urologist. It is most likely due to the number of hours he bikes which is  5-6 hours per week on average."  -saw Dr. Lafonda Mosses just 4 months ago and no hematuria- follows for stones   #History of elevated TSH in the past-normal in September 2022 Lab Results  Component Value  Date   TSH 4.82 05/01/2021   Recommended follow up: Return in about 1 year (around 01/29/2024) for physical or sooner if needed.Schedule b4 you leave. Future Appointments  Date Time Provider Department Center  05/06/2023  9:00 AM [facility] ANNUAL WELLNESS VISIT 1 [facility]  08/29/2023  8:30 AM Patwardhan, Anabel Bene, MD [facility] None   Lab/Order associations: fasting   ICD-10-CM   1. Preventative health care  Z00.00     2. Coronary artery disease s/p PTCA x 5 in 07/2017  I25.10     3. Dyslipidemia  E78.5 Comprehensive metabolic panel    CBC with Differential/Platelet    Lipid panel    TSH    4. Nonspecific abnormal results of thyroid function study  R94.6 TSH    5. Microscopic hematuria  R31.29     6. Screening for prostate cancer  Z12.5 PSA, Medicare      No orders of the defined types were placed in this encounter.   Return precautions advised.  Tana Conch, MD

## 2023-01-29 NOTE — Patient Instructions (Addendum)
Please stop by lab before you go If you have mychart- we will send your results within 3 business days of [place] receiving them.  If you do not have mychart- we will call you about results within 5 business days of [place] receiving them.  *please also note that you will see labs on mychart as soon as they post. I will later go in and write notes on them- will say "notes from Dr. Durene Cal"   Recommended follow up: Return in about 1 year (around 01/29/2024) for physical or sooner if needed.Schedule b4 you leave.

## 2023-02-01 ENCOUNTER — Encounter: Payer: Self-pay | Admitting: Cardiology

## 2023-02-01 DIAGNOSIS — M25579 Pain in unspecified ankle and joints of unspecified foot: Secondary | ICD-10-CM | POA: Diagnosis not present

## 2023-02-05 DIAGNOSIS — M25579 Pain in unspecified ankle and joints of unspecified foot: Secondary | ICD-10-CM | POA: Diagnosis not present

## 2023-02-08 DIAGNOSIS — M25579 Pain in unspecified ankle and joints of unspecified foot: Secondary | ICD-10-CM | POA: Diagnosis not present

## 2023-02-10 ENCOUNTER — Other Ambulatory Visit: Payer: Self-pay | Admitting: Family Medicine

## 2023-02-12 DIAGNOSIS — M25579 Pain in unspecified ankle and joints of unspecified foot: Secondary | ICD-10-CM | POA: Diagnosis not present

## 2023-02-15 DIAGNOSIS — M25579 Pain in unspecified ankle and joints of unspecified foot: Secondary | ICD-10-CM | POA: Diagnosis not present

## 2023-02-19 DIAGNOSIS — H5213 Myopia, bilateral: Secondary | ICD-10-CM | POA: Diagnosis not present

## 2023-02-19 DIAGNOSIS — M25579 Pain in unspecified ankle and joints of unspecified foot: Secondary | ICD-10-CM | POA: Diagnosis not present

## 2023-02-26 DIAGNOSIS — M25579 Pain in unspecified ankle and joints of unspecified foot: Secondary | ICD-10-CM | POA: Diagnosis not present

## 2023-03-04 DIAGNOSIS — M542 Cervicalgia: Secondary | ICD-10-CM | POA: Diagnosis not present

## 2023-03-04 DIAGNOSIS — M25579 Pain in unspecified ankle and joints of unspecified foot: Secondary | ICD-10-CM | POA: Diagnosis not present

## 2023-03-05 DIAGNOSIS — M542 Cervicalgia: Secondary | ICD-10-CM | POA: Diagnosis not present

## 2023-03-05 DIAGNOSIS — M25579 Pain in unspecified ankle and joints of unspecified foot: Secondary | ICD-10-CM | POA: Diagnosis not present

## 2023-03-19 DIAGNOSIS — M25579 Pain in unspecified ankle and joints of unspecified foot: Secondary | ICD-10-CM | POA: Diagnosis not present

## 2023-03-19 DIAGNOSIS — M542 Cervicalgia: Secondary | ICD-10-CM | POA: Diagnosis not present

## 2023-03-25 DIAGNOSIS — M542 Cervicalgia: Secondary | ICD-10-CM | POA: Diagnosis not present

## 2023-03-25 DIAGNOSIS — M25579 Pain in unspecified ankle and joints of unspecified foot: Secondary | ICD-10-CM | POA: Diagnosis not present

## 2023-04-04 ENCOUNTER — Encounter (INDEPENDENT_AMBULATORY_CARE_PROVIDER_SITE_OTHER): Payer: Self-pay

## 2023-04-04 DIAGNOSIS — M25579 Pain in unspecified ankle and joints of unspecified foot: Secondary | ICD-10-CM | POA: Diagnosis not present

## 2023-04-04 DIAGNOSIS — M542 Cervicalgia: Secondary | ICD-10-CM | POA: Diagnosis not present

## 2023-04-08 DIAGNOSIS — M25579 Pain in unspecified ankle and joints of unspecified foot: Secondary | ICD-10-CM | POA: Diagnosis not present

## 2023-04-08 DIAGNOSIS — M542 Cervicalgia: Secondary | ICD-10-CM | POA: Diagnosis not present

## 2023-04-09 DIAGNOSIS — M25579 Pain in unspecified ankle and joints of unspecified foot: Secondary | ICD-10-CM | POA: Diagnosis not present

## 2023-04-09 DIAGNOSIS — M542 Cervicalgia: Secondary | ICD-10-CM | POA: Diagnosis not present

## 2023-04-12 DIAGNOSIS — M542 Cervicalgia: Secondary | ICD-10-CM | POA: Diagnosis not present

## 2023-04-12 DIAGNOSIS — M25579 Pain in unspecified ankle and joints of unspecified foot: Secondary | ICD-10-CM | POA: Diagnosis not present

## 2023-04-19 DIAGNOSIS — M542 Cervicalgia: Secondary | ICD-10-CM | POA: Diagnosis not present

## 2023-04-19 DIAGNOSIS — M25579 Pain in unspecified ankle and joints of unspecified foot: Secondary | ICD-10-CM | POA: Diagnosis not present

## 2023-04-25 DIAGNOSIS — M542 Cervicalgia: Secondary | ICD-10-CM | POA: Diagnosis not present

## 2023-04-25 DIAGNOSIS — M25579 Pain in unspecified ankle and joints of unspecified foot: Secondary | ICD-10-CM | POA: Diagnosis not present

## 2023-04-30 ENCOUNTER — Ambulatory Visit (INDEPENDENT_AMBULATORY_CARE_PROVIDER_SITE_OTHER): Payer: Medicare Other

## 2023-04-30 ENCOUNTER — Encounter: Payer: Self-pay | Admitting: Family Medicine

## 2023-04-30 VITALS — Wt 167.0 lb

## 2023-04-30 DIAGNOSIS — Z0000 Encounter for general adult medical examination without abnormal findings: Secondary | ICD-10-CM | POA: Diagnosis not present

## 2023-04-30 NOTE — Patient Instructions (Signed)
Dale Gonzalez , Thank you for taking time to come for your Medicare Wellness Visit. I appreciate your ongoing commitment to your health goals. Please review the following plan we discussed and let me know if I can assist you in the future.   Referrals/Orders/Follow-Ups/Clinician Recommendations: CONTINUE EXERCISE   This is a list of the screening recommended for you and due dates:  Health Maintenance  Topic Date Due   Flu Shot  03/21/2023   COVID-19 Vaccine (5 - 2023-24 season) 04/21/2023   DTaP/Tdap/Td vaccine (3 - Td or Tdap) 02/19/2024   Medicare Annual Wellness Visit  04/29/2024   Colon Cancer Screening  03/27/2032   Pneumonia Vaccine  Completed   Hepatitis C Screening  Completed   Zoster (Shingles) Vaccine  Completed   HPV Vaccine  Aged Out    Advanced directives: (In Chart) A copy of your advanced directives are scanned into your chart should your provider ever need it.  Next Medicare Annual Wellness Visit scheduled for next year: Yes

## 2023-04-30 NOTE — Progress Notes (Signed)
Subjective:   Dale Gonzalez is a 72 y.o. male who presents for Medicare Annual/Subsequent preventive examination.  Visit Complete: Virtual  I connected with  Darl Householder on 04/30/23 by a audio enabled telemedicine application and verified that I am speaking with the correct person using two identifiers.  Patient Location: Home  Provider Location: Office/Clinic  I discussed the limitations of evaluation and management by telemedicine. The patient expressed understanding and agreed to proceed.   Patient Medicare AWV questionnaire was completed by the patient on 04/29/23; I have confirmed that all information answered by patient is correct and no changes since this date.  Vital Signs: Unable to obtain new vitals due to this being a telehealth visit.   Review of Systems     Cardiac Risk Factors include: advanced age (>6men, >62 women);dyslipidemia;hypertension;male gender     Objective:    Today's Vitals   04/30/23 0820  Weight: 167 lb (75.8 kg)   Body mass index is 25.39 kg/m.     04/30/2023    8:26 AM 05/03/2022   10:11 AM 08/17/2021   12:37 PM 04/03/2021    8:07 AM 03/28/2020    8:04 AM 08/08/2017    7:08 AM  Advanced Directives  Does Patient Have a Medical Advance Directive? Yes Yes Yes Yes Yes No  Type of Estate agent of [place];Living will Healthcare Power of [place];Living will Healthcare Power of [place];Living will Living will Healthcare Power of [place];Living will   Does patient want to make changes to medical advance directive? No - Patient declined No - Patient declined      Copy of Healthcare Power of Attorney in Chart? Yes - validated most recent copy scanned in chart (See row information) Yes - validated most recent copy scanned in chart (See row information)   No - copy requested   Would patient like information on creating a medical advance directive?      No - Patient declined    Current Medications (verified) Outpatient  Encounter Medications as of 04/30/2023  Medication Sig   aspirin EC (ASPIRIN LOW DOSE) 81 MG tablet TAKE 1 TABLET BY MOUTH DAILY - SWALLOW WHOLE   atorvastatin (LIPITOR) 80 MG tablet TAKE 1 TABLET (80 MG TOTAL) BY MOUTH DAILY.   EPINEPHRINE 0.3 mg/0.3 mL IJ SOAJ injection INJECT INTO THE MIDDLE OF THE OUTER THIGH AND HOLD FOR 10 SECONDS AS NEEDED FOR SEVERE ALLERGIC REACTION FOR BEE STINGS THEN CALL 911 IF USED   fluoruracil (FLUOROPLEX) 1 % cream    losartan (COZAAR) 25 MG tablet TAKE 1 TABLET (25 MG TOTAL) BY MOUTH DAILY.   metoprolol succinate (TOPROL-XL) 25 MG 24 hr tablet TAKE 1 TABLET (25 MG TOTAL) BY MOUTH DAILY.   nitroGLYCERIN (NITROSTAT) 0.4 MG SL tablet PLACE 1 TABLET (0.4 MG TOTAL) UNDER THE TONGUE EVERY 5 (FIVE) MINUTES AS NEEDED FOR CHEST PAIN.   No facility-administered encounter medications on file as of 04/30/2023.    Allergies (verified) Bee venom   History: Past Medical History:  Diagnosis Date   Allergy    Basal cell cancer    right cheek; ?left arm; Dr Karlyn Agee   Chicken pox    Coronary artery disease    Hyperlipidemia    LDL goal = <115   Myocardial infarction (HCC) 07/27/2017   RAD (reactive airway disease) 1998   cold induced cough @ [facility]   Seasonal allergies    Past Surgical History:  Procedure Laterality Date   COLONOSCOPY  2003 &  2013   negative; Dr Jarold Motto   CORONARY ANGIOPLASTY WITH STENT PLACEMENT  07/27/2017; 08/08/2017   CORONARY STENT INTERVENTION N/A 08/08/2017   Procedure: CORONARY STENT INTERVENTION;  Surgeon: Elder Negus, MD;  Location: [facility];  Service: Cardiovascular;  Laterality: N/A;   CORONARY ULTRASOUND/IVUS N/A 08/08/2017   Procedure: Intravascular Ultrasound/IVUS;  Surgeon: Elder Negus, MD;  Location: [facility];  Service: Cardiovascular;  Laterality: N/A;   COSMETIC SURGERY  04/2019 & 08/2019   Blepharoplasty (Dr. Dimas Millin) both eyelids. right eyelid at night does not always shut fully-  uses saline   dental implant     no sedation   EXTRACORPOREAL SHOCK WAVE LITHOTRIPSY Left 08/17/2021   Procedure: LEFT EXTRACORPOREAL SHOCK WAVE LITHOTRIPSY (ESWL);  Surgeon: Noel Christmas, MD;  Location[facility];  Service: Urology;  Laterality: Left;   KIDNEY STONE SURGERY     LEFT HEART CATH AND CORONARY ANGIOGRAPHY N/A 08/08/2017   Procedure: LEFT HEART CATH AND CORONARY ANGIOGRAPHY;  Surgeon: Elder Negus, MD;  Location: [facility];  Service: Cardiovascular;  Laterality: N/A;   LITHOTRIPSY  08/17/2021   MOHS SURGERY     "right face; ?left arm"   ROOT CANAL  01/04/2020   Bilateral   TONSILLECTOMY     WRIST GANGLION EXCISION Left    Dr Meade Maw   Family History  Problem Relation Age of Onset   Breast cancer Mother    Cancer Mother         cervical lymph nodes. mother has not shared primary cancer with him.    Heart disease Mother    Hypertension Mother    Hypertension Father    Heart disease Father        bypass surgery   Heart attack Father 48       Colostomy for ? diagnosis   COPD Father    Cancer Father        was not shared with patient primary cancer.    Hyperlipidemia Brother    Hypertension Brother    Hyperlipidemia Brother    Hypertension Brother    Post-traumatic stress disorder Son        Financial planner. disabled combat veteran.    Colon cancer Neg Hx    Esophageal cancer Neg Hx    Stomach cancer Neg Hx    Rectal cancer Neg Hx    Stroke Neg Hx    Diabetes Neg Hx    Colon polyps Neg Hx    Social History   Socioeconomic History   Marital status: Married    Spouse name: Not on file   Number of children: 2   Years of education: Not on file   Highest education level: Not on file  Occupational History   Occupation: Retired  Tobacco Use   Smoking status: Never   Smokeless tobacco: Never   Tobacco comments:    Never used  Vaping Use   Vaping status: Never Used  Substance and Sexual Activity   Alcohol use: Yes     Alcohol/week: 1.0 standard drink of alcohol    Types: 1 Standard drinks or equivalent per week    Comment: nominal consumption (@1  per week)   Drug use: Never   Sexual activity: Yes    Birth control/protection: None  Other Topics Concern   Not on file  Social History Narrative   Married 45 years in 2021.  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    Works part time for his prior business- may stop in 2021- about 10 hours a week      Hobbies: enjoys spending time with his grandkids, regular exercise. Active in his church- [employer].    Social Determinants of Health   Financial Resource Strain: Low Risk  (04/29/2023)   Overall Financial Resource Strain (CARDIA)    Difficulty of Paying Living Expenses: Not hard at all  Food Insecurity: No Food Insecurity (04/29/2023)   Hunger Vital Sign    Worried About Running Out of Food in the Last Year: Never true    Ran Out of Food in the Last Year: Never true  Transportation Needs: No Transportation Needs (04/29/2023)   PRAPARE - Administrator, Civil Service (Medical): No    Lack of Transportation (Non-Medical): No  Physical Activity: Sufficiently Active (04/29/2023)   Exercise Vital Sign    Days of Exercise per Week: 7 days    Minutes of Exercise per Session: 120 min  Stress: No Stress Concern Present (04/29/2023)   [employer] of Occupational Health - Occupational Stress Questionnaire    Feeling of Stress : Only a little  Social Connections: Socially Integrated (04/29/2023)   Social Connection and Isolation Panel [NHANES]    Frequency of Communication with Friends and Family: Three times a week    Frequency of Social Gatherings with Friends and Family: More than three times a week    Attends Religious Services: More than 4 times per year    Active Member of Clubs or Organizations: Yes     Attends Engineer, structural: More than 4 times per year    Marital Status: Married    Tobacco Counseling Counseling given: Not Answered Tobacco comments: Never used   Clinical Intake:  Pre-visit preparation completed: Yes  Pain : No/denies pain     BMI - recorded: 25.39 Nutritional Status: BMI 25 -29 Overweight Nutritional Risks: None Diabetes: No  How often do you need to have someone help you when you read instructions, pamphlets, or other written materials from your doctor or pharmacy?: 1 - Never  Interpreter Needed?: No  Information entered by :: Lanier Ensign, LPN   Activities of Daily Living    04/29/2023    6:45 PM 05/03/2022   10:12 AM  In your present state of health, do you have any difficulty performing the following activities:  Hearing? 0 0  Vision? 0 0  Difficulty concentrating or making decisions? 0 0  Walking or climbing stairs? 0 0  Dressing or bathing? 0 0  Doing errands, shopping? 0 0  Preparing Food and eating ? N N  Using the Toilet? N N  In the past six months, have you accidently leaked urine? N N  Do you have problems with loss of bowel control? N N  Managing your Medications? N N  Managing your Finances? N N  Housekeeping or managing your Housekeeping? N N    Patient Care Team: Shelva Majestic, MD as PCP - General (Family Medicine) Elder Negus, MD as Consulting Physician (Cardiology) Arminda Resides, MD as Consulting Physician (Dermatology) Maris Berger, MD as Consulting Physician (Ophthalmology) Charlsie Merles Kirstie Peri, DPM as Consulting Physician (Podiatry) Etta Quill, MD as Attending Physician (Ophthalmology) Dahlia Byes, [facility] (Inactive) as Pharmacist (Pharmacist)  Indicate any recent Medical Services you may have received from other  than [facility] providers in the past year (date may be approximate).     Assessment:   This is a routine wellness examination for Shuaib.  Hearing/Vision screen Hearing Screening  - Comments:: Pt denies any hearing issues  Vision Screening - Comments:: [facility]    Goals Addressed             This Visit's Progress    Patient Stated       Continue exercise        Depression Screen    04/30/2023    8:26 AM 01/29/2023    8:14 AM 05/03/2022   10:10 AM 06/16/2021   10:40 AM 04/03/2021    8:06 AM 04/19/2020   10:28 AM 03/28/2020    8:02 AM  PHQ 2/9 Scores  PHQ - 2 Score 0 1 0 0 0 0 0  PHQ- 9 Score  2    0     Fall Risk    04/29/2023    6:45 PM 01/29/2023    8:14 AM 05/03/2022   10:12 AM 06/16/2021   10:40 AM 04/03/2021    8:08 AM  Fall Risk   Falls in the past year? 0 0 0 0   Number falls in past yr: 0 0 0 0 0  Injury with Fall? 0 0 0 0 0  Risk for fall due to : Impaired vision No Fall Risks No Fall Risks;Impaired vision  Impaired vision  Follow up Falls prevention discussed Falls evaluation completed Falls prevention discussed  Falls prevention discussed    MEDICARE RISK AT HOME: Medicare Risk at Home Any stairs in or around the home?: Yes If so, are there any without handrails?: No Home free of loose throw rugs in walkways, pet beds, electrical cords, etc?: Yes Adequate lighting in your home to reduce risk of falls?: Yes Life alert?: No Use of a cane, walker or w/c?: No Grab bars in the bathroom?: No Shower chair or bench in shower?: No Elevated toilet seat or a handicapped toilet?: No  TIMED UP AND GO:  Was the test performed?  No    Cognitive Function:        04/30/2023    8:28 AM 05/03/2022   10:12 AM 04/03/2021    8:10 AM 03/28/2020    8:09 AM  6CIT Screen  What Year? 0 points 0 points 0 points   What month? 0 points 0 points 0 points   What time? 0 points 0 points 0 points   Count back from 20 0 points 0 points 0 points 0 points  Months in reverse 0 points 0 points 0 points 0 points  Repeat phrase 0 points 0 points 0 points 0 points  Total Score 0 points 0 points 0 points      Immunizations Immunization History  Administered Date(s) Administered   Fluad Quad(high Dose 65+) 05/17/2020, 05/01/2021, 06/21/2022   Influenza Whole 07/08/2007, 05/20/2008, 08/04/2008, 06/01/2010   Influenza,inj,Quad PF,6+ Mos 05/18/2014   Influenza-Unspecified 07/01/2017, 04/08/2019   PFIZER Comirnaty(Gray Top)Covid-19 Tri-Sucrose Vaccine 02/16/2021   PFIZER(Purple Top)SARS-COV-2 Vaccination 09/05/2019, 09/23/2019, 05/11/2020   PNEUMOCOCCAL CONJUGATE-20 05/01/2021   Pneumococcal Conjugate-13 08/02/2015, 03/27/2016   Td 12/24/2008   Tdap 02/18/2014   Zoster Recombinant(Shingrix) 07/15/2017, 10/17/2017   Zoster, Live 01/04/2011    TDAP status: Up to date  Flu Vaccine status: Due, Education has been provided regarding the importance of this vaccine. Advised may receive this vaccine at local pharmacy or Health Dept.  Aware to provide a copy of the vaccination record if obtained from local pharmacy or Health Dept. Verbalized acceptance and understanding.  Pneumococcal vaccine status: Up to date  Covid-19 vaccine status: Information provided on how to obtain vaccines.   Qualifies for Shingles Vaccine? Yes   Zostavax completed Yes   Shingrix Completed?: Yes  Screening Tests Health Maintenance  Topic Date Due   INFLUENZA VACCINE  03/21/2023   COVID-19 Vaccine (5 - 2023-24 season) 04/21/2023   DTaP/Tdap/Td (3 - Td or Tdap) 02/19/2024   Medicare Annual Wellness (AWV)  04/29/2024   Colonoscopy  03/27/2032   Pneumonia Vaccine 53+ Years old  Completed   Hepatitis C Screening  Completed   Zoster Vaccines- Shingrix  Completed   HPV VACCINES  Aged Out    Health Maintenance  Health Maintenance Due  Topic Date Due   INFLUENZA VACCINE  03/21/2023   COVID-19 Vaccine (5 - 2023-24 season) 04/21/2023    Colorectal cancer screening: Type of screening: Colonoscopy. Completed 03/27/22. Repeat every 10 years   Additional Screening:  Hepatitis C Screening: Completed  08/02/15  Vision Screening: Recommended annual ophthalmology exams for early detection of glaucoma and other disorders of the eye. Is the patient up to date with their annual eye exam?  Yes  Who is the provider or what is the name of the office in which the patient attends annual eye exams? [facility]  If pt is not established with a provider, would they like to be referred to a provider to establish care? No .   Dental Screening: Recommended annual dental exams for proper oral hygiene   Community Resource Referral / Chronic Care Management: CRR required this visit?  No   CCM required this visit?  No     Plan:     I have personally reviewed and noted the following in the patient's chart:   Medical and social history Use of alcohol, tobacco or illicit drugs  Current medications and supplements including opioid prescriptions. Patient is not currently taking opioid prescriptions. Functional ability and status Nutritional status Physical activity Advanced directives List of other physicians Hospitalizations, surgeries, and ER visits in previous 12 months Vitals Screenings to include cognitive, depression, and falls Referrals and appointments  In addition, I have reviewed and discussed with patient certain preventive protocols, quality metrics, and best practice recommendations. A written personalized care plan for preventive services as well as general preventive health recommendations were provided to patient.     Marzella Schlein, LPN   6/96/2952   After Visit Summary: (MyChart) Due to this being a telephonic visit, the after visit summary with patients personalized plan was offered to patient via MyChart   Nurse Notes: none

## 2023-05-03 DIAGNOSIS — M542 Cervicalgia: Secondary | ICD-10-CM | POA: Diagnosis not present

## 2023-05-03 DIAGNOSIS — M25579 Pain in unspecified ankle and joints of unspecified foot: Secondary | ICD-10-CM | POA: Diagnosis not present

## 2023-05-10 DIAGNOSIS — M542 Cervicalgia: Secondary | ICD-10-CM | POA: Diagnosis not present

## 2023-05-10 DIAGNOSIS — M25579 Pain in unspecified ankle and joints of unspecified foot: Secondary | ICD-10-CM | POA: Diagnosis not present

## 2023-06-17 NOTE — Progress Notes (Unsigned)
    Aleen Sells D.[facility] [address] [place] [address] Phone: [phone]   Assessment and Plan:     There are no diagnoses linked to this encounter.  ***   Pertinent previous records reviewed include ***   Follow Up: ***     Subjective:   I, Charle Clear, am serving as a Neurosurgeon for Doctor Richardean Sale  Chief Complaint: right shoulder pain   HPI:   06/18/2023 Patient is a 72 year old male complaining of right shoulder pain. Patient states  Relevant Historical Information: ***  Additional pertinent review of systems negative.   Current Outpatient Medications:    aspirin EC (ASPIRIN LOW DOSE) 81 MG tablet, TAKE 1 TABLET BY MOUTH DAILY - SWALLOW WHOLE, Disp: 90 tablet, Rfl: 3   atorvastatin (LIPITOR) 80 MG tablet, TAKE 1 TABLET (80 MG TOTAL) BY MOUTH DAILY., Disp: 90 tablet, Rfl: 3   EPINEPHRINE 0.3 mg/0.3 mL IJ SOAJ injection, INJECT INTO THE MIDDLE OF THE OUTER THIGH AND HOLD FOR 10 SECONDS AS NEEDED FOR SEVERE ALLERGIC REACTION FOR BEE STINGS THEN CALL 911 IF USED, Disp: 2 each, Rfl: 5   fluoruracil (FLUOROPLEX) 1 % cream, , Disp: , Rfl:    losartan (COZAAR) 25 MG tablet, TAKE 1 TABLET (25 MG TOTAL) BY MOUTH DAILY., Disp: 90 tablet, Rfl: 3   metoprolol succinate (TOPROL-XL) 25 MG 24 hr tablet, TAKE 1 TABLET (25 MG TOTAL) BY MOUTH DAILY., Disp: 90 tablet, Rfl: 3   nitroGLYCERIN (NITROSTAT) 0.4 MG SL tablet, PLACE 1 TABLET (0.4 MG TOTAL) UNDER THE TONGUE EVERY 5 (FIVE) MINUTES AS NEEDED FOR CHEST PAIN., Disp: 25 tablet, Rfl: 3   Objective:     There were no vitals filed for this visit.    There is no height or weight on file to calculate BMI.    Physical Exam:    ***   Electronically signed by:  Aleen Sells D.[facility] 4:09 PM 06/17/23

## 2023-06-18 ENCOUNTER — Ambulatory Visit (INDEPENDENT_AMBULATORY_CARE_PROVIDER_SITE_OTHER): Payer: Medicare HMO

## 2023-06-18 ENCOUNTER — Other Ambulatory Visit: Payer: Self-pay

## 2023-06-18 ENCOUNTER — Ambulatory Visit: Payer: Medicare HMO | Admitting: Sports Medicine

## 2023-06-18 VITALS — BP 132/82 | HR 67 | Ht 68.0 in | Wt 167.0 lb

## 2023-06-18 DIAGNOSIS — M19011 Primary osteoarthritis, right shoulder: Secondary | ICD-10-CM | POA: Diagnosis not present

## 2023-06-18 DIAGNOSIS — M25511 Pain in right shoulder: Secondary | ICD-10-CM | POA: Diagnosis not present

## 2023-06-18 DIAGNOSIS — M7551 Bursitis of right shoulder: Secondary | ICD-10-CM | POA: Diagnosis not present

## 2023-06-18 DIAGNOSIS — G8929 Other chronic pain: Secondary | ICD-10-CM

## 2023-06-18 NOTE — Patient Instructions (Addendum)
Continue HEP and PT   1 week of rest from upper extremity activities  -Recommend gradual return to physical activity.  Start activity at 50% (speed, duration, reps, sets, intensity) and allow 24 hours to assess for worsening pain.  If 50% is well-tolerated, may increase next activity to 75%.  If 75% is well-tolerated, may increase next physical activity to 100%.  If any of these levels cause pain, recommend dropping down to previous level for an additional 2-3 attempts before advancing. 3-4 week follow up

## 2023-07-12 NOTE — Progress Notes (Unsigned)
    Dale Gonzalez D.[facility] [address] [place] [address] Phone: [phone]   Assessment and Plan:     There are no diagnoses linked to this encounter.  ***   Pertinent previous records reviewed include ***    Follow Up: ***     Subjective:   I, Dale Gonzalez, am serving as a Neurosurgeon for Doctor Richardean Sale   Chief Complaint: right shoulder pain    HPI:    06/18/2023 Patient is a 72 year old male complaining of right shoulder pain. Patient states he has been going to PT . Pt thinks its a torn labrum. Decreased ROM. Pain for about 4-5 months . Pain with push ups. Pain right on the lateral deltoid he is TTP. No meds for the pain. No numbness or tingling . Grip strength no issue .   07/16/2023 Patient states  Relevant Historical Information: History of MI, hypertension, GERD  Additional pertinent review of systems negative.   Current Outpatient Medications:    aspirin EC (ASPIRIN LOW DOSE) 81 MG tablet, TAKE 1 TABLET BY MOUTH DAILY - SWALLOW WHOLE, Disp: 90 tablet, Rfl: 3   atorvastatin (LIPITOR) 80 MG tablet, TAKE 1 TABLET (80 MG TOTAL) BY MOUTH DAILY., Disp: 90 tablet, Rfl: 3   EPINEPHRINE 0.3 mg/0.3 mL IJ SOAJ injection, INJECT INTO THE MIDDLE OF THE OUTER THIGH AND HOLD FOR 10 SECONDS AS NEEDED FOR SEVERE ALLERGIC REACTION FOR BEE STINGS THEN CALL 911 IF USED, Disp: 2 each, Rfl: 5   fluoruracil (FLUOROPLEX) 1 % cream, , Disp: , Rfl:    losartan (COZAAR) 25 MG tablet, TAKE 1 TABLET (25 MG TOTAL) BY MOUTH DAILY., Disp: 90 tablet, Rfl: 3   metoprolol succinate (TOPROL-XL) 25 MG 24 hr tablet, TAKE 1 TABLET (25 MG TOTAL) BY MOUTH DAILY., Disp: 90 tablet, Rfl: 3   nitroGLYCERIN (NITROSTAT) 0.4 MG SL tablet, PLACE 1 TABLET (0.4 MG TOTAL) UNDER THE TONGUE EVERY 5 (FIVE) MINUTES AS NEEDED FOR CHEST PAIN., Disp: 25 tablet, Rfl: 3   Objective:     There were no vitals filed for this visit.    There is no height or weight on file  to calculate BMI.    Physical Exam:    ***   Electronically signed by:  Dale Gonzalez [facility] 10:22 AM 07/12/23

## 2023-07-16 ENCOUNTER — Ambulatory Visit: Payer: Medicare HMO | Admitting: Sports Medicine

## 2023-07-16 VITALS — BP 122/78 | HR 63 | Ht 68.0 in | Wt 164.0 lb

## 2023-07-16 DIAGNOSIS — M7551 Bursitis of right shoulder: Secondary | ICD-10-CM

## 2023-07-16 DIAGNOSIS — G8929 Other chronic pain: Secondary | ICD-10-CM | POA: Diagnosis not present

## 2023-07-16 DIAGNOSIS — M25511 Pain in right shoulder: Secondary | ICD-10-CM | POA: Diagnosis not present

## 2023-07-16 NOTE — Patient Instructions (Signed)
Tylenol and Voltaren gel as needed See me as needed

## 2023-07-17 DIAGNOSIS — M25579 Pain in unspecified ankle and joints of unspecified foot: Secondary | ICD-10-CM | POA: Diagnosis not present

## 2023-07-17 DIAGNOSIS — M542 Cervicalgia: Secondary | ICD-10-CM | POA: Diagnosis not present

## 2023-07-24 DIAGNOSIS — M542 Cervicalgia: Secondary | ICD-10-CM | POA: Diagnosis not present

## 2023-07-24 DIAGNOSIS — M25579 Pain in unspecified ankle and joints of unspecified foot: Secondary | ICD-10-CM | POA: Diagnosis not present

## 2023-08-07 DIAGNOSIS — M25579 Pain in unspecified ankle and joints of unspecified foot: Secondary | ICD-10-CM | POA: Diagnosis not present

## 2023-08-07 DIAGNOSIS — M542 Cervicalgia: Secondary | ICD-10-CM | POA: Diagnosis not present

## 2023-08-13 DIAGNOSIS — M542 Cervicalgia: Secondary | ICD-10-CM | POA: Diagnosis not present

## 2023-08-13 DIAGNOSIS — M25579 Pain in unspecified ankle and joints of unspecified foot: Secondary | ICD-10-CM | POA: Diagnosis not present

## 2023-08-22 ENCOUNTER — Other Ambulatory Visit: Payer: Self-pay | Admitting: Cardiology

## 2023-08-22 DIAGNOSIS — I2510 Atherosclerotic heart disease of native coronary artery without angina pectoris: Secondary | ICD-10-CM

## 2023-08-29 ENCOUNTER — Ambulatory Visit: Payer: Self-pay | Admitting: Cardiology

## 2023-08-30 DIAGNOSIS — M25579 Pain in unspecified ankle and joints of unspecified foot: Secondary | ICD-10-CM | POA: Diagnosis not present

## 2023-08-30 DIAGNOSIS — M542 Cervicalgia: Secondary | ICD-10-CM | POA: Diagnosis not present

## 2023-09-03 ENCOUNTER — Ambulatory Visit: Payer: HMO | Attending: Cardiology | Admitting: Cardiology

## 2023-09-03 ENCOUNTER — Encounter: Payer: Self-pay | Admitting: Cardiology

## 2023-09-03 VITALS — BP 124/70 | HR 55 | Resp 16 | Ht 68.0 in | Wt 166.6 lb

## 2023-09-03 DIAGNOSIS — I2510 Atherosclerotic heart disease of native coronary artery without angina pectoris: Secondary | ICD-10-CM | POA: Diagnosis not present

## 2023-09-03 MED ORDER — NITROGLYCERIN 0.4 MG SL SUBL: SUBLINGUAL_TABLET | SUBLINGUAL | 3 refills | Status: AC | PRN

## 2023-09-03 NOTE — Progress Notes (Signed)
  Cardiology Office Note:  .   Date:  09/03/2023  ID:  Dale Gonzalez, DOB May 01, 1951, MRN [id number] PCP: Katrinka Garnette KIDD, MD  [facility] Providers Cardiologist:  Newman Lawrence, MD PCP: Katrinka Garnette KIDD, MD  Chief Complaint  Patient presents with   Coronary artery disease involving native coronary artery of   Follow-up    1 year      History of Present Illness: .    Dale Gonzalez is a 73 y.o. male with coronary artery disease   Patient had culprit and staged non culprit vessel PCI after initial STEMI presentation in 2018.  Patient is doing very well, denies any complaints of chest pain shortness of breath.  He is staying active with regular exercise and physical activity without complaints.  He wonders if he could come off any medications.    Vitals:   09/03/23 1202  BP: 124/70  Pulse: (!) 55  Resp: 16  SpO2: 95%     ROS:  Review of Systems  Cardiovascular:  Negative for chest pain, dyspnea on exertion, leg swelling, palpitations and syncope.     Studies Reviewed: SABRA        EKG 09/03/2023: Sinus bradycardia Low voltage QRS Left anterior fascicular block When compared with ECG of 18-Dec-2017 16:38, No significant change since     Independently interpreted 01/2023: Chol 105, TG 119, HDL 35, LDL 45 Cr 0.98 TSH 5.0   Physical Exam:   Physical Exam Vitals and nursing note reviewed.  Constitutional:      General: He is not in acute distress. Neck:     Vascular: No JVD.  Cardiovascular:     Rate and Rhythm: Normal rate and regular rhythm.     Heart sounds: Normal heart sounds. No murmur heard. Pulmonary:     Effort: Pulmonary effort is normal.     Breath sounds: Normal breath sounds. No wheezing or rales.      VISIT DIAGNOSES:   ICD-10-CM   1. Coronary artery disease involving native coronary artery of native heart without angina pectoris  I25.10 EKG 12-Lead    nitroGLYCERIN  (NITROSTAT ) 0.4 MG SL tablet       ASSESSMENT AND  PLAN: .    Dale Gonzalez is a 73 y.o. male with coronary artery disease (STEMI and staged multivessel PCI 07/2017).   CAD: No angina symptoms with excellent baseline functional capacity. Continue Aspirin  81 mg ,losartan  25 mg, lipitor  80 mg daily.   Hypertension: Well controlled   Meds ordered this encounter  Medications   nitroGLYCERIN  (NITROSTAT ) 0.4 MG SL tablet    Sig: PLACE 1 TABLET (0.4 MG TOTAL) UNDER THE TONGUE EVERY 5 (FIVE) MINUTES AS NEEDED FOR CHEST PAIN.    Dispense:  25 tablet    Refill:  3     F/u in 1 year  Signed, Newman JINNY Lawrence, MD

## 2023-09-03 NOTE — Patient Instructions (Signed)
 Medication Instructions:   STOP TAKING METOPROLOL  NOW  *If you need a refill on your cardiac medications before your next appointment, please call your pharmacy*      Follow-Up: At [facility] you and your health needs are our priority.  As part of our continuing mission to provide you with exceptional heart care, we have created designated Provider Care Teams.  These Care Teams include your primary Cardiologist (physician) and Advanced Practice Providers (APPs -  Physician Assistants and Nurse Practitioners) who all work together to provide you with the care you need, when you need it.  We recommend signing up for the patient portal called MyChart.  Sign up information is provided on this After Visit Summary.  MyChart is used to connect with patients for Virtual Visits (Telemedicine).  Patients are able to view lab/test results, encounter notes, upcoming appointments, etc.  Non-urgent messages can be sent to your provider as well.   To learn more about what you can do with MyChart, go to [email or web address].    Your next appointment:    1  year(s)  Provider:   DR. PATWARDHAN

## 2023-09-06 DIAGNOSIS — M542 Cervicalgia: Secondary | ICD-10-CM | POA: Diagnosis not present

## 2023-09-06 DIAGNOSIS — M25579 Pain in unspecified ankle and joints of unspecified foot: Secondary | ICD-10-CM | POA: Diagnosis not present

## 2023-09-12 ENCOUNTER — Telehealth: Payer: Self-pay | Admitting: Cardiology

## 2023-09-12 DIAGNOSIS — I10 Essential (primary) hypertension: Secondary | ICD-10-CM

## 2023-09-12 DIAGNOSIS — Z79899 Other long term (current) drug therapy: Secondary | ICD-10-CM

## 2023-09-12 MED ORDER — LOSARTAN POTASSIUM 50 MG PO TABS: 50.0000 mg | ORAL_TABLET | Freq: Every day | ORAL | 1 refills | Status: DC

## 2023-09-12 NOTE — Addendum Note (Signed)
Addended by: Loa Socks on: 09/12/2023 11:39 AM   Modules accepted: Orders

## 2023-09-12 NOTE — Telephone Encounter (Signed)
Patient came in to inform of blood pressure (verbally). Stated dr. Rosemary Holms told him to let him know blood pressure readings to decide what steps to take. Called triage, triage would call patient asap.

## 2023-09-12 NOTE — Telephone Encounter (Signed)
Returned a call back to the pt and discussed recommendations per Dr. Rosemary Holms.   Pt confirmed with me that his heart rates primarily are staying in the 50s.   With that, I informed him that we will now increase his losartan to 50 mg po daily and have him come in for repeat BMET next Thursday 1/30 at the [facility].   Also informed him that Dr. Rosemary Holms wants him to see an APP in 4 weeks, for BP management and follow-up.  Scheduled the pt to come in and see Eligha Bridegroom NP for Monday 10/14/23 at 2:45 pm.  He is aware to arrive 15 mins prior to that appt.  Confirmed the pharmacy of choice with the pt.   BMET in one week placed in the system and released.   Pt verbalized understanding and agrees with this plan.

## 2023-09-12 NOTE — Telephone Encounter (Signed)
Sorry to hear that.  Please check if he know his heart rate.  It is possible that he did not tolerate stopping metoprolol with spike in blood pressure, as well as possibly spike in heart rate.  We have 2 options.  We could resume metoprolol succinate at 25 mg daily, or increase losartan to 50 mg daily.  If increase losartan, recommend BMP check in 1 week.  With the above changes, and uncontrolled blood pressure readings, recommend follow-up visit with either me or APP or Pharm.D. in next 4 weeks to further discuss this.  Thanks MJP

## 2023-09-12 NOTE — Telephone Encounter (Signed)
Returned a call back to the pt.  He walked in to report elevated BP readings and symptoms he has been experiencing with the increased numbers.  Pt recently saw Dr. Rosemary Holms where he stopped his metoprolol and started him on losartan 25 mg po daily.  Pt has been closely tracking his numbers at home since the regimen change.  He states his numbers are consistently staying in the 150s/80s on losartan 25 mg po daily.  He states he feels very flushed and hot.  He denies any chest pain, sob, doe, orthopnea, pre-syncope/syncope or headache.  Occasionally gets dizzy with his elevated BPs.   Pt confirmed he is tracking this after losartan administration in the morning.   Pt states he walked into the office today to discuss elevated numbers and symptoms he was experiencing.   Pt aware I will have Dr. Rosemary Holms further advise on this matter and I will follow-up with him accordingly thereafter.   Pt agreed to this plan.

## 2023-09-13 DIAGNOSIS — M542 Cervicalgia: Secondary | ICD-10-CM | POA: Diagnosis not present

## 2023-09-13 DIAGNOSIS — M25579 Pain in unspecified ankle and joints of unspecified foot: Secondary | ICD-10-CM | POA: Diagnosis not present

## 2023-09-19 ENCOUNTER — Encounter: Payer: Self-pay | Admitting: Cardiology

## 2023-09-19 DIAGNOSIS — Z79899 Other long term (current) drug therapy: Secondary | ICD-10-CM | POA: Diagnosis not present

## 2023-09-19 DIAGNOSIS — I10 Essential (primary) hypertension: Secondary | ICD-10-CM | POA: Diagnosis not present

## 2023-09-19 LAB — BASIC METABOLIC PANEL
BUN/Creatinine Ratio: 24 (ref 10–24)
BUN: 24 mg/dL (ref 8–27)
CO2: 22 mmol/L (ref 20–29)
Calcium: 9.5 mg/dL (ref 8.6–10.2)
Chloride: 103 mmol/L (ref 96–106)
Creatinine, Ser: 1 mg/dL (ref 0.76–1.27)
Glucose: 93 mg/dL (ref 70–99)
Potassium: 4.7 mmol/L (ref 3.5–5.2)
Sodium: 140 mmol/L (ref 134–144)
eGFR: 79 mL/min/{1.73_m2} (ref >59)

## 2023-09-27 DIAGNOSIS — M542 Cervicalgia: Secondary | ICD-10-CM | POA: Diagnosis not present

## 2023-09-27 DIAGNOSIS — M25579 Pain in unspecified ankle and joints of unspecified foot: Secondary | ICD-10-CM | POA: Diagnosis not present

## 2023-10-04 DIAGNOSIS — M542 Cervicalgia: Secondary | ICD-10-CM | POA: Diagnosis not present

## 2023-10-04 DIAGNOSIS — M25579 Pain in unspecified ankle and joints of unspecified foot: Secondary | ICD-10-CM | POA: Diagnosis not present

## 2023-10-11 DIAGNOSIS — M542 Cervicalgia: Secondary | ICD-10-CM | POA: Diagnosis not present

## 2023-10-11 DIAGNOSIS — M25579 Pain in unspecified ankle and joints of unspecified foot: Secondary | ICD-10-CM | POA: Diagnosis not present

## 2023-10-13 NOTE — Progress Notes (Unsigned)
 Cardiology Office Note:  .   Date:  10/14/2023  ID:  Dale Gonzalez, DOB 1951-03-23, MRN [id number] PCP: Shelva Majestic, MD  [facility] Providers Cardiologist:  None    Patient Profile: .      PMH Coronary artery disease S/p STEMI 07/2017 >> staged PCI [facility] 08/08/2017 Complex LAD/Dig bifurcation PTCA and stenting Diagonal 2 2.25 x 16 millimeter Synergy DES Mid LAD 2.75 x 38 mm Synergy DES Proximal LAD 3.5 x 20 mm Synergy DES Known residual moderate disease and small caliber RCA Inferior lateral hypokinesis EF 50 to 55% Stress test 09/2018 Excellent exercise capacity, evidence of old LCx territory infarct, no ischemia Ischemic cardiomyopathy 2D echo 08/09/2017 LVEF 45-50%, mild inferolateral HK, mild MR Sinus bradycardia Hyperlipidemia Hypertension  He established with Dr. Rosemary Holms following STEMI in [place] on 07/27/2017 with elective PCI to nonculprit severe LAD diagonal stenosis with bifurcation stenting on 08/08/2017.  He underwent nuclear stress test 09/2018 which revealed excellent exercise capacity, evidence of old LCx territory infarct, no ischemia.  He has maintained consistent follow-up.  Last cardiology clinic visit was 09/03/2023 with Dr. Rosemary Holms at which time he was active with regular exercise and had no concerning cardiac symptoms.  He wondered if he could come off any of his medications.  EKG revealed sinus bradycardia, low voltage QRS, LAFB.  He was on aspirin 81 mg daily, losartan 25 mg daily, Toprol-XL 25 mg daily, and Lipitor 80 mg daily.  BP was well-controlled.  He was advised he could discontinue metoprolol.  Lipids were well-controlled 01/2023.  He was advised to return in 1 year for follow-up.  He came into our office 09/12/2023 to report BP and pulse readings. BP was in the 150s/80s on losartan 25 mg daily.  He reported feeling very flushed and hot and would occasionally get dizzy with elevated BP.  Pulse was staying in the 50s bpm.  He was advised  to increase losartan to 50 mg daily and have BMET in 1 week. Renal function was stable on labs 09/19/23.        History of Present Illness: .   Dale Gonzalez is a very pleasant 73 y.o. male who is here today for follow-up of hypertension. He reports a recent concern with his home BP readings, which were consistently elevated at 140-150 mmHg. However, upon checking his blood pressure at the clinic, it was found to be within normal limits (120-122 mmHg). He subsequently changed his machine and has since been getting readings within the normal range (118-122 mmHg). He has continued losartan 50mg  daily and is not having any concerning side effects. He feels well overall and has noticed that he is sleeping more deeply than before. He is very active, participating in body pump classes three times a week, walking four miles twice a week, playing golf, attending spin classes three times a week, and mowing the lawn. He also works on balance exercises daily. He reports no changes in his balance ability. He eats a healthy diet, cooking most meals at home and paying attention to fat and sodium content. He avoids pre-packaged foods and junk food, and uses olive oil for cooking. He also limits his consumption of candy and prefers bread from a bakery that does not use preservatives. Reports frustration with high-normal cholesterol levels (180-185 mg/dL) for over 20 years which were untreated until he had heart attack. HDL levels are low (35-36 mg/dL), despite his high level of physical activity.  He denies concerning cardiac symptoms including  no chest pain, shortness of breath, palpitations, edema, orthopnea, PND, presyncope, or syncope.   Discussed the use of AI scribe software for clinical note transcription with the patient, who gave verbal consent to proceed.   ROS: See HPI       Studies Reviewed: .        Risk Assessment/Calculations:             Physical Exam:   VS:  BP 112/64   Pulse 63   Ht 5\' 7"   (1.702 m)   Wt 169 lb 6.4 oz (76.8 kg)   SpO2 97%   BMI 26.53 kg/m    Wt Readings from Last 3 Encounters:  10/14/23 169 lb 6.4 oz (76.8 kg)  09/03/23 166 lb 9.6 oz (75.6 kg)  07/16/23 164 lb (74.4 kg)    GEN: Well nourished, well developed in no acute distress NECK: No JVD; No carotid bruits CARDIAC: RRR, no murmurs, rubs, gallops RESPIRATORY:  Clear to auscultation without rales, wheezing or rhonchi  ABDOMEN: Soft, non-tender, non-distended EXTREMITIES:  No edema; No deformity     ASSESSMENT AND PLAN: .    CAD: History of MI with staged PCI as outlined above in 2018 in [place]. Repeat LHC 08/08/2017 with patent stents, known residual moderate disease and small caliber RCA, medical therapy recommended.Marland Kitchen He leads a very active lifestyle. He denies chest pain, dyspnea, or other symptoms concerning for angina.  No indication for further ischemic evaluation at this time. No bleeding concerns. Continue aspirin, atorvastatin, losartan.  Hyperlipidemia LDL goal < 55: Lipid panel 01/29/23: Total cholesterol 105, triglycerides 119, HDL 35.50, LDL 45.  No concerning side effects on atorvastatin.  Continue atorvastatin 80 mg daily.  Hypertension:  BP is well controlled and remains well controlled on my recheck.  He is pleased with the improvement and is tolerating the increased dose of losartan without concerning side effects. Continue losartan 50 mg once daily.  Renal function stable on labs completed 09/19/23 one week after increasing ARB.        Disposition:January 2026 with Dr. Rosemary Holms  Signed, Eligha Bridegroom, NP-C

## 2023-10-14 ENCOUNTER — Ambulatory Visit: Payer: HMO | Attending: Nurse Practitioner | Admitting: Nurse Practitioner

## 2023-10-14 ENCOUNTER — Encounter: Payer: Self-pay | Admitting: Nurse Practitioner

## 2023-10-14 VITALS — BP 112/64 | HR 63 | Ht 67.0 in | Wt 169.4 lb

## 2023-10-14 DIAGNOSIS — I10 Essential (primary) hypertension: Secondary | ICD-10-CM | POA: Diagnosis not present

## 2023-10-14 DIAGNOSIS — E785 Hyperlipidemia, unspecified: Secondary | ICD-10-CM

## 2023-10-14 DIAGNOSIS — I2510 Atherosclerotic heart disease of native coronary artery without angina pectoris: Secondary | ICD-10-CM

## 2023-10-14 NOTE — Patient Instructions (Signed)
 Medication Instructions:  Your physician recommends that you continue on your current medications as directed. Please refer to the Current Medication list given to you today.  *If you need a refill on your cardiac medications before your next appointment, please call your pharmacy*   Lab Work: None Ordered If you have labs (blood work) drawn today and your tests are completely normal, you will receive your results only by: MyChart Message (if you have MyChart) OR A paper copy in the mail If you have any lab test that is abnormal or we need to change your treatment, we will call you to review the results.   Testing/Procedures: None Ordered   Follow-Up: At [facility], you and your health needs are our priority.  As part of our continuing mission to provide you with exceptional heart care, we have created designated Provider Care Teams.  These Care Teams include your primary Cardiologist (physician) and Advanced Practice Providers (APPs -  Physician Assistants and Nurse Practitioners) who all work together to provide you with the care you need, when you need it.  We recommend signing up for the patient portal called "MyChart".  Sign up information is provided on this After Visit Summary.  MyChart is used to connect with patients for Virtual Visits (Telemedicine).  Patients are able to view lab/test results, encounter notes, upcoming appointments, etc.  Non-urgent messages can be sent to your provider as well.   To learn more about what you can do with MyChart, go to [email or web address].    Your next appointment:   1 year(s)  Provider:   None     Other Instructions **Encourage your children to check Lipoprotein a

## 2023-10-15 ENCOUNTER — Encounter: Payer: Self-pay | Admitting: Cardiology

## 2023-10-18 DIAGNOSIS — M542 Cervicalgia: Secondary | ICD-10-CM | POA: Diagnosis not present

## 2023-10-18 DIAGNOSIS — M25579 Pain in unspecified ankle and joints of unspecified foot: Secondary | ICD-10-CM | POA: Diagnosis not present

## 2023-10-25 DIAGNOSIS — M542 Cervicalgia: Secondary | ICD-10-CM | POA: Diagnosis not present

## 2023-10-25 DIAGNOSIS — M25579 Pain in unspecified ankle and joints of unspecified foot: Secondary | ICD-10-CM | POA: Diagnosis not present

## 2023-10-31 DIAGNOSIS — M542 Cervicalgia: Secondary | ICD-10-CM | POA: Diagnosis not present

## 2023-10-31 DIAGNOSIS — M25579 Pain in unspecified ankle and joints of unspecified foot: Secondary | ICD-10-CM | POA: Diagnosis not present

## 2023-11-08 DIAGNOSIS — M25579 Pain in unspecified ankle and joints of unspecified foot: Secondary | ICD-10-CM | POA: Diagnosis not present

## 2023-11-08 DIAGNOSIS — M542 Cervicalgia: Secondary | ICD-10-CM | POA: Diagnosis not present

## 2023-11-13 DIAGNOSIS — M542 Cervicalgia: Secondary | ICD-10-CM | POA: Diagnosis not present

## 2023-11-13 DIAGNOSIS — M25579 Pain in unspecified ankle and joints of unspecified foot: Secondary | ICD-10-CM | POA: Diagnosis not present

## 2023-11-15 ENCOUNTER — Other Ambulatory Visit: Payer: Self-pay | Admitting: Cardiology

## 2023-11-15 DIAGNOSIS — I2510 Atherosclerotic heart disease of native coronary artery without angina pectoris: Secondary | ICD-10-CM

## 2023-11-19 ENCOUNTER — Other Ambulatory Visit: Payer: Self-pay | Admitting: Cardiology

## 2023-11-19 DIAGNOSIS — I2510 Atherosclerotic heart disease of native coronary artery without angina pectoris: Secondary | ICD-10-CM

## 2023-11-22 DIAGNOSIS — M25579 Pain in unspecified ankle and joints of unspecified foot: Secondary | ICD-10-CM | POA: Diagnosis not present

## 2023-11-22 DIAGNOSIS — M542 Cervicalgia: Secondary | ICD-10-CM | POA: Diagnosis not present

## 2023-11-26 DIAGNOSIS — D2271 Melanocytic nevi of right lower limb, including hip: Secondary | ICD-10-CM | POA: Diagnosis not present

## 2023-11-26 DIAGNOSIS — L821 Other seborrheic keratosis: Secondary | ICD-10-CM | POA: Diagnosis not present

## 2023-11-26 DIAGNOSIS — D692 Other nonthrombocytopenic purpura: Secondary | ICD-10-CM | POA: Diagnosis not present

## 2023-11-26 DIAGNOSIS — L570 Actinic keratosis: Secondary | ICD-10-CM | POA: Diagnosis not present

## 2023-11-26 DIAGNOSIS — D485 Neoplasm of uncertain behavior of skin: Secondary | ICD-10-CM | POA: Diagnosis not present

## 2023-11-26 DIAGNOSIS — L814 Other melanin hyperpigmentation: Secondary | ICD-10-CM | POA: Diagnosis not present

## 2023-11-26 DIAGNOSIS — L218 Other seborrheic dermatitis: Secondary | ICD-10-CM | POA: Diagnosis not present

## 2023-11-26 DIAGNOSIS — Z85828 Personal history of other malignant neoplasm of skin: Secondary | ICD-10-CM | POA: Diagnosis not present

## 2023-11-26 DIAGNOSIS — L905 Scar conditions and fibrosis of skin: Secondary | ICD-10-CM | POA: Diagnosis not present

## 2023-11-26 DIAGNOSIS — D2272 Melanocytic nevi of left lower limb, including hip: Secondary | ICD-10-CM | POA: Diagnosis not present

## 2023-11-26 DIAGNOSIS — L820 Inflamed seborrheic keratosis: Secondary | ICD-10-CM | POA: Diagnosis not present

## 2023-11-29 DIAGNOSIS — M542 Cervicalgia: Secondary | ICD-10-CM | POA: Diagnosis not present

## 2023-11-29 DIAGNOSIS — M25579 Pain in unspecified ankle and joints of unspecified foot: Secondary | ICD-10-CM | POA: Diagnosis not present

## 2023-12-05 DIAGNOSIS — M542 Cervicalgia: Secondary | ICD-10-CM | POA: Diagnosis not present

## 2023-12-05 DIAGNOSIS — M25579 Pain in unspecified ankle and joints of unspecified foot: Secondary | ICD-10-CM | POA: Diagnosis not present

## 2023-12-12 DIAGNOSIS — M542 Cervicalgia: Secondary | ICD-10-CM | POA: Diagnosis not present

## 2023-12-12 DIAGNOSIS — M25579 Pain in unspecified ankle and joints of unspecified foot: Secondary | ICD-10-CM | POA: Diagnosis not present

## 2023-12-17 DIAGNOSIS — M542 Cervicalgia: Secondary | ICD-10-CM | POA: Diagnosis not present

## 2023-12-17 DIAGNOSIS — M25579 Pain in unspecified ankle and joints of unspecified foot: Secondary | ICD-10-CM | POA: Diagnosis not present

## 2023-12-26 DIAGNOSIS — M25579 Pain in unspecified ankle and joints of unspecified foot: Secondary | ICD-10-CM | POA: Diagnosis not present

## 2024-01-10 DIAGNOSIS — M542 Cervicalgia: Secondary | ICD-10-CM | POA: Diagnosis not present

## 2024-01-10 DIAGNOSIS — M25579 Pain in unspecified ankle and joints of unspecified foot: Secondary | ICD-10-CM | POA: Diagnosis not present

## 2024-01-14 DIAGNOSIS — L570 Actinic keratosis: Secondary | ICD-10-CM | POA: Diagnosis not present

## 2024-01-14 DIAGNOSIS — L821 Other seborrheic keratosis: Secondary | ICD-10-CM | POA: Diagnosis not present

## 2024-01-14 DIAGNOSIS — Z85828 Personal history of other malignant neoplasm of skin: Secondary | ICD-10-CM | POA: Diagnosis not present

## 2024-01-14 DIAGNOSIS — D485 Neoplasm of uncertain behavior of skin: Secondary | ICD-10-CM | POA: Diagnosis not present

## 2024-01-16 DIAGNOSIS — H524 Presbyopia: Secondary | ICD-10-CM | POA: Diagnosis not present

## 2024-01-16 DIAGNOSIS — H5213 Myopia, bilateral: Secondary | ICD-10-CM | POA: Diagnosis not present

## 2024-01-16 DIAGNOSIS — H2513 Age-related nuclear cataract, bilateral: Secondary | ICD-10-CM | POA: Diagnosis not present

## 2024-01-16 DIAGNOSIS — H52221 Regular astigmatism, right eye: Secondary | ICD-10-CM | POA: Diagnosis not present

## 2024-01-16 DIAGNOSIS — M25579 Pain in unspecified ankle and joints of unspecified foot: Secondary | ICD-10-CM | POA: Diagnosis not present

## 2024-01-23 DIAGNOSIS — M542 Cervicalgia: Secondary | ICD-10-CM | POA: Diagnosis not present

## 2024-01-23 DIAGNOSIS — M25579 Pain in unspecified ankle and joints of unspecified foot: Secondary | ICD-10-CM | POA: Diagnosis not present

## 2024-01-30 DIAGNOSIS — M542 Cervicalgia: Secondary | ICD-10-CM | POA: Diagnosis not present

## 2024-01-30 DIAGNOSIS — M25579 Pain in unspecified ankle and joints of unspecified foot: Secondary | ICD-10-CM | POA: Diagnosis not present

## 2024-02-04 ENCOUNTER — Ambulatory Visit (INDEPENDENT_AMBULATORY_CARE_PROVIDER_SITE_OTHER): Payer: Medicare HMO | Admitting: Family Medicine

## 2024-02-04 ENCOUNTER — Encounter: Payer: Self-pay | Admitting: Family Medicine

## 2024-02-04 VITALS — BP 100/70 | HR 65 | Temp 97.5°F | Ht 67.0 in | Wt 165.1 lb

## 2024-02-04 DIAGNOSIS — Z125 Encounter for screening for malignant neoplasm of prostate: Secondary | ICD-10-CM

## 2024-02-04 DIAGNOSIS — I10 Essential (primary) hypertension: Secondary | ICD-10-CM | POA: Diagnosis not present

## 2024-02-04 DIAGNOSIS — Z0000 Encounter for general adult medical examination without abnormal findings: Secondary | ICD-10-CM

## 2024-02-04 DIAGNOSIS — E785 Hyperlipidemia, unspecified: Secondary | ICD-10-CM | POA: Diagnosis not present

## 2024-02-04 DIAGNOSIS — Z131 Encounter for screening for diabetes mellitus: Secondary | ICD-10-CM | POA: Diagnosis not present

## 2024-02-04 DIAGNOSIS — R351 Nocturia: Secondary | ICD-10-CM

## 2024-02-04 DIAGNOSIS — E663 Overweight: Secondary | ICD-10-CM

## 2024-02-04 NOTE — Patient Instructions (Addendum)
 He wants to do labs pre-visit next time- he will message 2 weeks ahead of time  Could trial OTC (available over the counter without a prescription) Flonase in the morning for 2 weeks to see if helps with nighttime cough or morning mucus- we also mentioned possible reflux related but wouldn't explain the mucus  Please stop by lab before you go If you have mychart- we will send your results within 3 business days of us  receiving them.  If you do not have mychart- we will call you about results within 5 business days of us  receiving them.  *please also note that you will see labs on mychart as soon as they post. I will later go in and write notes on them- will say notes from Dr. Arlene Ben   Recommended follow up: Return in about 1 year (around 02/03/2025) for physical or sooner if needed.Schedule b4 you leave.

## 2024-02-04 NOTE — Progress Notes (Signed)
 Phone: [phone]   Subjective:  Patient presents today for their annual physical. Chief complaint-noted.   See problem oriented charting- ROS- full  review of systems was completed and negative  Per full ROS sheet completed by patient except for topics noted under acute/chronic concerns  The following were reviewed and entered/updated in epic: Past Medical History:  Diagnosis Date   Allergy    Basal cell cancer    right cheek; ?left arm; Dr Marcia Setters   Chicken pox    Coronary artery disease    Hyperlipidemia    LDL goal = <115   Myocardial infarction (HCC) 07/27/2017   RAD (reactive airway disease) 1998   cold induced cough @ [facility]   Seasonal allergies    Patient Active Problem List   Diagnosis Date Noted   Coronary artery disease s/p PTCA x 5 in 07/2017 10/10/2018    Priority: High   Essential hypertension 08/30/2022    Priority: Medium    Dupuytren's contracture of left hand 05/04/2021    Priority: Medium    Elevated blood pressure reading without diagnosis of hypertension 05/18/2014    Priority: Medium    Nonspecific abnormal results of thyroid  function study 01/12/2014    Priority: Medium    Dyslipidemia 12/18/2006    Priority: Medium    GERD (gastroesophageal reflux disease) 08/04/2019    Priority: Low   Exercise-induced asthma primarily cold induced 07/24/2013    Priority: Low   Skin cancer, basal cell 12/27/2011    Priority: Low   Microscopic hematuria 12/27/2011    Priority: Low   Allergic rhinitis 12/19/2007    Priority: Low   Past Surgical History:  Procedure Laterality Date   COLONOSCOPY  2003 & 2013   negative; Dr Adan Holms   CORONARY ANGIOPLASTY WITH STENT PLACEMENT  07/27/2017; 08/08/2017   CORONARY STENT INTERVENTION N/A 08/08/2017   Procedure: CORONARY STENT INTERVENTION;  Surgeon: Cody Das, MD;  Location: [facility];  Service: Cardiovascular;  Laterality: N/A;   CORONARY ULTRASOUND/IVUS N/A 08/08/2017   Procedure:  Intravascular Ultrasound/IVUS;  Surgeon: Cody Das, MD;  Location: [facility];  Service: Cardiovascular;  Laterality: N/A;   COSMETIC SURGERY  04/2019 & 08/2019   Blepharoplasty (Dr. Zaldivar) both eyelids. right eyelid at night does not always shut fully- uses saline   dental implant     no sedation   EXTRACORPOREAL SHOCK WAVE LITHOTRIPSY Left 08/17/2021   Procedure: LEFT EXTRACORPOREAL SHOCK WAVE LITHOTRIPSY (ESWL);  Surgeon: Roxane Copp, MD;  Location: [facility];  Service: Urology;  Laterality: Left;   KIDNEY STONE SURGERY     LEFT HEART CATH AND CORONARY ANGIOGRAPHY N/A 08/08/2017   Procedure: LEFT HEART CATH AND CORONARY ANGIOGRAPHY;  Surgeon: Cody Das, MD;  Location: [facility];  Service: Cardiovascular;  Laterality: N/A;   LITHOTRIPSY  08/17/2021   MOHS SURGERY     right face; ?left arm   ROOT CANAL  01/04/2020   Bilateral   TONSILLECTOMY     WRIST GANGLION EXCISION Left    Dr Emil Harada    Family History  Problem Relation Age of Onset   Breast cancer Mother    Cancer Mother         cervical lymph nodes. mother has not shared primary cancer with him.    Heart disease Mother    Hypertension Mother    Hypertension Father    Heart disease Father        bypass surgery  Heart attack Father 37       Colostomy for ? diagnosis   COPD Father    Cancer Father        was not shared with patient primary cancer.    Hyperlipidemia Brother    Hypertension Brother    Hyperlipidemia Brother    Hypertension Brother    Post-traumatic stress disorder Son        Financial planner. disabled combat veteran.    Colon cancer Neg Hx    Esophageal cancer Neg Hx    Stomach cancer Neg Hx    Rectal cancer Neg Hx    Stroke Neg Hx    Diabetes Neg Hx    Colon polyps Neg Hx     Medications- reviewed and updated Current Outpatient Medications  Medication Sig Dispense Refill   aspirin  EC (ASPIRIN  LOW DOSE) 81 MG tablet TAKE 1 TABLET  BY MOUTH DAILY *SWALLOW WHOLE* 90 tablet 3   atorvastatin  (LIPITOR ) 80 MG tablet TAKE 1 TABLET BY MOUTH DAILY 90 tablet 3   fluoruracil (FLUOROPLEX) 1 % cream      losartan  (COZAAR ) 50 MG tablet Take 1 tablet (50 mg total) by mouth daily. 90 tablet 1   nitroGLYCERIN  (NITROSTAT ) 0.4 MG SL tablet PLACE 1 TABLET (0.4 MG TOTAL) UNDER THE TONGUE EVERY 5 (FIVE) MINUTES AS NEEDED FOR CHEST PAIN. 25 tablet 3   EPINEPHRINE  0.3 mg/0.3 mL IJ SOAJ injection INJECT INTO THE MIDDLE OF THE OUTER THIGH AND HOLD FOR 10 SECONDS AS NEEDED FOR SEVERE ALLERGIC REACTION FOR BEE STINGS THEN CALL 911 IF USED (Patient not taking: Reported on 02/04/2024) 2 each 5   No current facility-administered medications for this visit.    Allergies-reviewed and updated Allergies  Allergen Reactions   Bee Venom Anaphylaxis and Swelling    Swelling of the throat    Social History   Social History Narrative   Married 45 years in 2021.  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    Works part time for his prior business- may stop in 2021- about 10 hours a week      Hobbies: enjoys spending time with his grandkids, regular exercise. Active in his church- [employer].    Objective  Objective:  BP 100/70   Pulse 65   Temp (!) 97.5 F (36.4 C)   Ht 5' 7 (1.702 m)   Wt 165 lb 1.6 oz (74.9 kg)   SpO2 97%   BMI 25.86 kg/m  Gen: NAD, resting comfortably HEENT: Mucous membranes are moist. Oropharynx normal Neck: no thyromegaly CV: RRR no murmurs rubs or gallops Lungs: CTAB no crackles, wheeze, rhonchi Abdomen: soft/nontender/nondistended/normal bowel sounds. No rebound or guarding.  Ext: no edema Skin: warm, dry Neuro: grossly normal, moves all extremities, PERRLA Declines genitourinary and rectal   Assessment and Plan  73 y.o. male presenting for annual physical.  Health  Maintenance counseling: 1. Anticipatory guidance: Patient counseled regarding regular dental exams -q6 months, eye exams -yearly with catararct surgery upcoming in july,  avoiding smoking and second hand smoke , limiting alcohol  to 2 beverages per day - 1-2 every few months, no illicit drugs .   2. Risk factor reduction:  Advised patient of need for regular exercise and diet rich and fruits and vegetables to reduce risk of heart attack and stroke.  Exercise- 6-7 days most  weeks still- very active 6 miles average walking per day- advising 129 minute a day- brisk pace.  Diet/weight management-weight within 2 lbs- reasonably healthy with muscle mass he has.  Wt Readings from Last 3 Encounters:  02/04/24 165 lb 1.6 oz (74.9 kg)  10/14/23 169 lb 6.4 oz (76.8 kg)  09/03/23 166 lb 9.6 oz (75.6 kg)  3. Immunizations/screenings/ancillary studies- Tetanus, Diphtheria, and Pertussis (Tdap) planned at pharmacy, plans on fall COVID and flu shot  Immunization History  Administered Date(s) Administered   Fluad Quad(high Dose 65+) 05/17/2020, 05/01/2021, 06/21/2022   Influenza Whole 07/08/2007, 05/20/2008, 08/04/2008, 06/01/2010   Influenza,inj,Quad PF,6+ Mos 05/18/2014   Influenza-Unspecified 07/01/2017, 04/08/2019   PFIZER Comirnaty(Gray Top)Covid-19 Tri-Sucrose Vaccine 02/16/2021   PFIZER(Purple Top)SARS-COV-2 Vaccination 09/05/2019, 09/23/2019, 05/11/2020   PNEUMOCOCCAL CONJUGATE-20 05/01/2021   Pneumococcal Conjugate-13 08/02/2015, 03/27/2016   Td 12/24/2008   Tdap 02/18/2014   Zoster Recombinant(Shingrix) 07/15/2017, 10/17/2017   Zoster, Live 01/04/2011  4. Prostate cancer screening- low risk prior trend- update psa today  with overall good health . Mildly up last year but still low risk trend Lab Results  Component Value Date   PSA 0.70 01/29/2023   PSA 0.50 01/26/2022   PSA 0.40 05/31/2021   5. Colon cancer screening - History of tubular adenoma-detected by Dr. Savannah Curlin August 2023 with 7-year  follow-up 6. Skin cancer screening- Dr. Marcia Setters yearly. advised regular sunscreen use. Denies worrisome, changing, or new skin lesions.  7. Smoking associated screening (lung cancer screening, AAA screen 65-75, UA)- never smoker 8. STD screening - only active with wife  Status of chronic or acute concerns   # wants to do labs pre-visit next time- he will message 2 weeks ahead of time.  #sees physical therapy weekly for various aches and pains and finds helpful  #cough right before bedtime and in morning has fair amount of mucus. Worse with certain seasons -we mentioned reflux- trigger in past with certain foods but limits though- kale and spinach to morning smoothie helped - could be allergens as well- could trial Flonase if desired  #Cad- follows with Dr. Corbett Desanctis of PTCA times 12/29/2016 #hyperlipidemia #hypertension S: Medication:aspirin  81 mg, atorvastatin  80 mg, losartan  50 mg -prior metoprolol  25 mg XR (losartan  and metoprolol  were started post MI) but they stopped -nitroglycerin  on hand- frequency of use: not needing - no chest pain or shortness of breath   Lab Results  Component Value Date   CHOL 105 01/29/2023   HDL 35.50 (L) 01/29/2023   LDLCALC 45 01/29/2023   TRIG 119.0 01/29/2023   CHOLHDL 3 01/29/2023  A/P: coronary artery disease asymptomatic continue current medications  Lipids hopefully stable- update lipid today. Continue current meds for now  Hypertension stable- continue current medicines - no lightheadedness  #Exercise-induced asthma primarily cold-induced-no recent issues, masking cold weather is helpful- no recent issues   #History of hematuria-has seen Dr. Luster Salters in the past and later in 2022 Dr. Parke Boll as had nephrolithiasis causing hematuria-we will check urine microscopic - check today to make sure not worse- has seen them as recently as February 2024- was told follow up as needed only - so refer back only if worsens  #History of elevated TSH in  the past-normal in September and offered repeat today  Lab Results  Component Value Date   TSH 5.08 01/29/2023   Recommended follow up: Return in about 1 year (around 02/03/2025) for physical or sooner if needed.Schedule b4 you leave. Future Appointments  Date Time Provider Department Center  05/05/2024  8:00 AM [facility] ANNUAL WELLNESS VISIT 1 [facility]   Lab/Order associations: fasting   ICD-10-CM   1. Preventative health care  Z00.00     2. Screening for prostate cancer  Z12.5 CANCELED: PSA, Medicare    3. Screening for diabetes mellitus  Z13.1 Hemoglobin A1c    CANCELED: Hemoglobin A1c    4. Overweight  E66.3 Hemoglobin A1c    CANCELED: Hemoglobin A1c    5. Dyslipidemia  E78.5 TSH    CBC with Differential/Platelet    Comprehensive metabolic panel with GFR    Lipid panel    CANCELED: Comprehensive metabolic panel with GFR    CANCELED: CBC with Differential/Platelet    CANCELED: Lipid panel    6. Essential hypertension  I10 Urinalysis, Routine w reflex microscopic    CANCELED: Urinalysis, Routine w reflex microscopic    7. Nocturia  R35.1 PSA      No orders of the defined types were placed in this encounter.   Return precautions advised.  Clarisa Crooked, MD

## 2024-02-05 ENCOUNTER — Ambulatory Visit: Payer: Self-pay | Admitting: Family Medicine

## 2024-02-05 LAB — COMPREHENSIVE METABOLIC PANEL WITH GFR
AG Ratio: 1.8 (calc) (ref 1.0–2.5)
ALT: 23 U/L (ref 9–46)
AST: 25 U/L (ref 10–35)
Albumin: 4.5 g/dL (ref 3.6–5.1)
Alkaline phosphatase (APISO): 59 U/L (ref 35–144)
BUN: 20 mg/dL (ref 7–25)
CO2: 24 mmol/L (ref 20–32)
Calcium: 9.4 mg/dL (ref 8.6–10.3)
Chloride: 102 mmol/L (ref 98–110)
Creat: 1 mg/dL (ref 0.70–1.28)
Globulin: 2.5 g/dL (ref 1.9–3.7)
Glucose, Bld: 93 mg/dL (ref 65–99)
Potassium: 4.6 mmol/L (ref 3.5–5.3)
Sodium: 138 mmol/L (ref 135–146)
Total Bilirubin: 0.7 mg/dL (ref 0.2–1.2)
Total Protein: 7 g/dL (ref 6.1–8.1)
eGFR: 79 mL/min/{1.73_m2} (ref >=60)

## 2024-02-05 LAB — LIPID PANEL
Cholesterol: 103 mg/dL (ref <200)
HDL: 38 mg/dL — ABNORMAL LOW (ref >=40)
LDL Cholesterol (Calc): 46 mg/dL
Non-HDL Cholesterol (Calc): 65 mg/dL (ref <130)
Total CHOL/HDL Ratio: 2.7 (calc) (ref <5.0)
Triglycerides: 104 mg/dL (ref <150)

## 2024-02-05 LAB — HEMOGLOBIN A1C
Hgb A1c MFr Bld: 5.7 % — ABNORMAL HIGH (ref <5.7)
Mean Plasma Glucose: 117 mg/dL
eAG (mmol/L): 6.5 mmol/L

## 2024-02-05 LAB — CBC WITH DIFFERENTIAL/PLATELET
Absolute Lymphocytes: 1733 {cells}/uL (ref 850–3900)
Absolute Monocytes: 760 {cells}/uL (ref 200–950)
Basophils Absolute: 53 {cells}/uL (ref 0–200)
Basophils Relative: 0.7 %
Eosinophils Absolute: 160 {cells}/uL (ref 15–500)
Eosinophils Relative: 2.1 %
HCT: 48.2 % (ref 38.5–50.0)
Hemoglobin: 15.4 g/dL (ref 13.2–17.1)
MCH: 29.8 pg (ref 27.0–33.0)
MCHC: 32 g/dL (ref 32.0–36.0)
MCV: 93.2 fL (ref 80.0–100.0)
MPV: 9.9 fL (ref 7.5–12.5)
Monocytes Relative: 10 %
Neutro Abs: 4894 {cells}/uL (ref 1500–7800)
Neutrophils Relative %: 64.4 %
Platelets: 248 10*3/uL (ref 140–400)
RBC: 5.17 10*6/uL (ref 4.20–5.80)
RDW: 13.2 % (ref 11.0–15.0)
Total Lymphocyte: 22.8 %
WBC: 7.6 10*3/uL (ref 3.8–10.8)

## 2024-02-05 LAB — URINALYSIS, ROUTINE W REFLEX MICROSCOPIC
Bilirubin Urine: NEGATIVE
Glucose, UA: NEGATIVE
Hgb urine dipstick: NEGATIVE
Ketones, ur: NEGATIVE
Leukocytes,Ua: NEGATIVE
Nitrite: NEGATIVE
Protein, ur: NEGATIVE
Specific Gravity, Urine: 1.008 (ref 1.001–1.035)
pH: 7 (ref 5.0–8.0)

## 2024-02-05 LAB — TSH: TSH: 4.52 m[IU]/L — ABNORMAL HIGH (ref 0.40–4.50)

## 2024-02-05 LAB — PSA: PSA: 0.72 ng/mL (ref <=4.00)

## 2024-02-07 DIAGNOSIS — M542 Cervicalgia: Secondary | ICD-10-CM | POA: Diagnosis not present

## 2024-02-07 DIAGNOSIS — M25579 Pain in unspecified ankle and joints of unspecified foot: Secondary | ICD-10-CM | POA: Diagnosis not present

## 2024-02-13 DIAGNOSIS — M25579 Pain in unspecified ankle and joints of unspecified foot: Secondary | ICD-10-CM | POA: Diagnosis not present

## 2024-02-13 DIAGNOSIS — M542 Cervicalgia: Secondary | ICD-10-CM | POA: Diagnosis not present

## 2024-02-28 DIAGNOSIS — H25043 Posterior subcapsular polar age-related cataract, bilateral: Secondary | ICD-10-CM | POA: Diagnosis not present

## 2024-02-28 DIAGNOSIS — H18413 Arcus senilis, bilateral: Secondary | ICD-10-CM | POA: Diagnosis not present

## 2024-02-28 DIAGNOSIS — H2513 Age-related nuclear cataract, bilateral: Secondary | ICD-10-CM | POA: Diagnosis not present

## 2024-02-28 DIAGNOSIS — H25013 Cortical age-related cataract, bilateral: Secondary | ICD-10-CM | POA: Diagnosis not present

## 2024-02-28 DIAGNOSIS — H2511 Age-related nuclear cataract, right eye: Secondary | ICD-10-CM | POA: Diagnosis not present

## 2024-03-02 DIAGNOSIS — M25579 Pain in unspecified ankle and joints of unspecified foot: Secondary | ICD-10-CM | POA: Diagnosis not present

## 2024-03-09 DIAGNOSIS — M25579 Pain in unspecified ankle and joints of unspecified foot: Secondary | ICD-10-CM | POA: Diagnosis not present

## 2024-03-13 ENCOUNTER — Other Ambulatory Visit: Payer: Self-pay | Admitting: Cardiology

## 2024-03-13 DIAGNOSIS — I10 Essential (primary) hypertension: Secondary | ICD-10-CM

## 2024-03-13 DIAGNOSIS — Z79899 Other long term (current) drug therapy: Secondary | ICD-10-CM

## 2024-03-16 DIAGNOSIS — M25579 Pain in unspecified ankle and joints of unspecified foot: Secondary | ICD-10-CM | POA: Diagnosis not present

## 2024-03-23 DIAGNOSIS — M25579 Pain in unspecified ankle and joints of unspecified foot: Secondary | ICD-10-CM | POA: Diagnosis not present

## 2024-03-31 DIAGNOSIS — M25579 Pain in unspecified ankle and joints of unspecified foot: Secondary | ICD-10-CM | POA: Diagnosis not present

## 2024-04-06 DIAGNOSIS — M25579 Pain in unspecified ankle and joints of unspecified foot: Secondary | ICD-10-CM | POA: Diagnosis not present

## 2024-04-20 DIAGNOSIS — M25579 Pain in unspecified ankle and joints of unspecified foot: Secondary | ICD-10-CM | POA: Diagnosis not present

## 2024-04-27 DIAGNOSIS — M25579 Pain in unspecified ankle and joints of unspecified foot: Secondary | ICD-10-CM | POA: Diagnosis not present

## 2024-05-04 DIAGNOSIS — M25579 Pain in unspecified ankle and joints of unspecified foot: Secondary | ICD-10-CM | POA: Diagnosis not present

## 2024-05-05 ENCOUNTER — Ambulatory Visit (INDEPENDENT_AMBULATORY_CARE_PROVIDER_SITE_OTHER): Payer: Medicare HMO

## 2024-05-05 ENCOUNTER — Encounter: Payer: Self-pay | Admitting: Family Medicine

## 2024-05-05 VITALS — Ht 67.0 in | Wt 165.0 lb

## 2024-05-05 DIAGNOSIS — Z0000 Encounter for general adult medical examination without abnormal findings: Secondary | ICD-10-CM | POA: Diagnosis not present

## 2024-05-05 NOTE — Patient Instructions (Signed)
 Mr. Dale Gonzalez,  Thank you for taking the time for your Medicare Wellness Visit. I appreciate your continued commitment to your health goals. Please review the care plan we discussed, and feel free to reach out if I can assist you further.  Medicare recommends these wellness visits once per year to help you and your care team stay ahead of potential health issues. These visits are designed to focus on prevention, allowing your provider to concentrate on managing your acute and chronic conditions during your regular appointments.  Please note that Annual Wellness Visits do not include a physical exam. Some assessments may be limited, especially if the visit was conducted virtually. If needed, we may recommend a separate in-person follow-up with your provider.  Ongoing Care Seeing your primary care provider every 3 to 6 months helps us  monitor your health and provide consistent, personalized care.   Referrals If a referral was made during today's visit and you haven't received any updates within two weeks, please contact the referred provider directly to check on the status.  Recommended Screenings:  Health Maintenance  Topic Date Due   DTaP/Tdap/Td vaccine (3 - Td or Tdap) 02/19/2024   Flu Shot  03/20/2024   COVID-19 Vaccine (5 - 2025-26 season) 04/20/2024   Medicare Annual Wellness Visit  04/29/2024   Colon Cancer Screening  03/27/2032   Pneumococcal Vaccine for age over 34  Completed   Hepatitis C Screening  Completed   Zoster (Shingles) Vaccine  Completed   HPV Vaccine  Aged Out   Meningitis B Vaccine  Aged Out       05/05/2024    8:07 AM  Advanced Directives  Does Patient Have a Medical Advance Directive? Yes  Type of Estate agent of [place];Living will  Does patient want to make changes to medical advance directive? No - Patient declined  Copy of Healthcare Power of Attorney in Chart? Yes - validated most recent copy scanned in chart (See row information)    Advance Care Planning is important because it: Ensures you receive medical care that aligns with your values, goals, and preferences. Provides guidance to your family and loved ones, reducing the emotional burden of decision-making during critical moments.  Vision: Annual vision screenings are recommended for early detection of glaucoma, cataracts, and diabetic retinopathy. These exams can also reveal signs of chronic conditions such as diabetes and high blood pressure.  Dental: Annual dental screenings help detect early signs of oral cancer, gum disease, and other conditions linked to overall health, including heart disease and diabetes.  Please see the attached documents for additional preventive care recommendations.

## 2024-05-05 NOTE — Progress Notes (Addendum)
 Subjective:   Dale Gonzalez is a 73 y.o. who presents for a Medicare Wellness preventive visit.  As a reminder, Annual Wellness Visits don't include a physical exam, and some assessments may be limited, especially if this visit is performed virtually. We may recommend an in-person follow-up visit with your provider if needed.  Visit Complete: Virtual I connected with  Dale Gonzalez on 05/05/24 by a audio enabled telemedicine application and verified that I am speaking with the correct person using two identifiers.  Patient Location: Home  Provider Location: Office/Clinic  I discussed the limitations of evaluation and management by telemedicine. The patient expressed understanding and agreed to proceed.  Vital Signs: Because this visit was a virtual/telehealth visit, some criteria may be missing or patient reported. Any vitals not documented were not able to be obtained and vitals that have been documented are patient reported.  VideoDeclined- This patient declined Librarian, academic. Therefore the visit was completed with audio only.  Persons Participating in Visit: Patient.  AWV Questionnaire: Yes: Patient Medicare AWV questionnaire was completed by the patient on 05/01/24; I have confirmed that all information answered by patient is correct and no changes since this date.  Cardiac Risk Factors include: advanced age (>9men, >68 women);male gender;dyslipidemia;hypertension     Objective:    Today's Vitals   05/05/24 0803  Weight: 165 lb (74.8 kg)  Height: 5' 7 (1.702 m)   Body mass index is 25.84 kg/m.     05/05/2024    8:07 AM 04/30/2023    8:26 AM 05/03/2022   10:11 AM 08/17/2021   12:37 PM 04/03/2021    8:07 AM 03/28/2020    8:04 AM 08/08/2017    7:08 AM  Advanced Directives  Does Patient Have a Medical Advance Directive? Yes Yes Yes Yes Yes Yes No   Type of Estate agent of [place];Living will Healthcare Power of  [place];Living will Healthcare Power of [place];Living will Healthcare Power of [place];Living will Living will Healthcare Power of [place];Living will   Does patient want to make changes to medical advance directive? No - Patient declined No - Patient declined No - Patient declined      Copy of Healthcare Power of Attorney in Chart? Yes - validated most recent copy scanned in chart (See row information) Yes - validated most recent copy scanned in chart (See row information) Yes - validated most recent copy scanned in chart (See row information)   No - copy requested   Would patient like information on creating a medical advance directive?       No - Patient declined      Data saved with a previous flowsheet row definition    Current Medications (verified) Outpatient Encounter Medications as of 05/05/2024  Medication Sig   aspirin  EC (ASPIRIN  LOW DOSE) 81 MG tablet TAKE 1 TABLET BY MOUTH DAILY *SWALLOW WHOLE*   atorvastatin  (LIPITOR ) 80 MG tablet TAKE 1 TABLET BY MOUTH DAILY   BOOSTRIX 5-2.5-18.5 LF-MCG/0.5 injection    fluoruracil (FLUOROPLEX) 1 % cream    gatifloxacin (ZYMAXID) 0.5 % SOLN 1 drop 4 (four) times daily.   losartan  (COZAAR ) 50 MG tablet TAKE 1 TABLET BY MOUTH DAILY   nitroGLYCERIN  (NITROSTAT ) 0.4 MG SL tablet PLACE 1 TABLET (0.4 MG TOTAL) UNDER THE TONGUE EVERY 5 (FIVE) MINUTES AS NEEDED FOR CHEST PAIN.   BESIVANCE 0.6 % SUSP Place 1 drop into the right eye 4 (four) times daily. (Patient not taking: Reported on 05/05/2024)  Bromfenac Sodium 0.07 % SOLN Apply 1 drop to eye 2 (two) times daily. (Patient not taking: Reported on 05/05/2024)   Difluprednate 0.05 % EMUL Apply 1 drop to eye 4 (four) times daily. (Patient not taking: Reported on 05/05/2024)   EPINEPHRINE  0.3 mg/0.3 mL IJ SOAJ injection INJECT INTO THE MIDDLE OF THE OUTER THIGH AND HOLD FOR 10 SECONDS AS NEEDED FOR SEVERE ALLERGIC REACTION FOR BEE STINGS THEN CALL 911 IF USED (Patient not taking: Reported on 05/05/2024)    No facility-administered encounter medications on file as of 05/05/2024.    Allergies (verified) Bee venom   History: Past Medical History:  Diagnosis Date   Allergy    Basal cell cancer    right cheek; ?left arm; Dr Rolan Molt   Chicken pox    Coronary artery disease    Hyperlipidemia    LDL goal = <115   Myocardial infarction (HCC) 07/27/2017   RAD (reactive airway disease) 1998   cold induced cough @ [facility]   Seasonal allergies    Past Surgical History:  Procedure Laterality Date   COLONOSCOPY  2003 & 2013   negative; Dr Jakie   CORONARY ANGIOPLASTY WITH STENT PLACEMENT  07/27/2017; 08/08/2017   CORONARY STENT INTERVENTION N/A 08/08/2017   Procedure: CORONARY STENT INTERVENTION;  Surgeon: Elmira Newman PARAS, MD;  Location: [facility];  Service: Cardiovascular;  Laterality: N/A;   CORONARY ULTRASOUND/IVUS N/A 08/08/2017   Procedure: Intravascular Ultrasound/IVUS;  Surgeon: Elmira Newman PARAS, MD;  Location: [facility];  Service: Cardiovascular;  Laterality: N/A;   COSMETIC SURGERY  04/2019 & 08/2019   Blepharoplasty (Dr. Zaldivar) both eyelids. right eyelid at night does not always shut fully- uses saline   dental implant     no sedation   EXTRACORPOREAL SHOCK WAVE LITHOTRIPSY Left 08/17/2021   Procedure: LEFT EXTRACORPOREAL SHOCK WAVE LITHOTRIPSY (ESWL);  Surgeon: Elisabeth Valli BIRCH, MD;  Location: [facility];  Service: Urology;  Laterality: Left;   KIDNEY STONE SURGERY     LEFT HEART CATH AND CORONARY ANGIOGRAPHY N/A 08/08/2017   Procedure: LEFT HEART CATH AND CORONARY ANGIOGRAPHY;  Surgeon: Elmira Newman PARAS, MD;  Location: [facility];  Service: Cardiovascular;  Laterality: N/A;   LITHOTRIPSY  08/17/2021   MOHS SURGERY     right face; ?left arm   ROOT CANAL  01/04/2020   Bilateral   TONSILLECTOMY     WRIST GANGLION EXCISION Left    Dr Willy   Family History  Problem Relation Age of Onset   Breast cancer  Mother    Cancer Mother         cervical lymph nodes. mother has not shared primary cancer with him.    Heart disease Mother    Hypertension Mother    Hypertension Father    Heart disease Father        bypass surgery   Heart attack Father 33       Colostomy for ? diagnosis   COPD Father    Cancer Father        was not shared with patient primary cancer.    Hyperlipidemia Brother    Hypertension Brother    Hyperlipidemia Brother    Hypertension Brother    Post-traumatic stress disorder Son        Financial planner. disabled combat veteran.    Colon cancer Neg Hx    Esophageal cancer Neg Hx    Stomach cancer Neg Hx    Rectal cancer  Neg Hx    Stroke Neg Hx    Diabetes Neg Hx    Colon polyps Neg Hx    Social History   Socioeconomic History   Marital status: Married    Spouse name: Not on file   Number of children: 2   Years of education: Not on file   Highest education level: Professional school degree (e.g., MD, DDS, DVM, JD)  Occupational History   Occupation: Retired  Tobacco Use   Smoking status: Never   Smokeless tobacco: Never   Tobacco comments:    Never used  Vaping Use   Vaping status: Never Used  Substance and Sexual Activity   Alcohol  use: Yes    Alcohol /week: 1.0 standard drink of alcohol     Types: 1 Standard drinks or equivalent per week    Comment: nominal consumption (@1 -2 per month)   Drug use: Never   Sexual activity: Yes    Birth control/protection: None  Other Topics Concern   Not on file  Social History Narrative   Married 45 years in 2021.  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    Works part time for his prior business- may stop in 2021- about 10 hours a week      Hobbies: enjoys spending time with his grandkids, regular exercise. Active in his church- [employer].    Social Drivers of Manufacturing engineer Strain: Low Risk  (05/01/2024)   Overall Financial Resource Strain (CARDIA)    Difficulty of Paying Living Expenses: Not hard at all  Food Insecurity: No Food Insecurity (05/01/2024)   Hunger Vital Sign    Worried About Running Out of Food in the Last Year: Never true    Ran Out of Food in the Last Year: Never true  Transportation Needs: No Transportation Needs (05/01/2024)   PRAPARE - Administrator, Civil Service (Medical): No    Lack of Transportation (Non-Medical): No  Physical Activity: Sufficiently Active (05/01/2024)   Exercise Vital Sign    Days of Exercise per Week: 7 days    Minutes of Exercise per Session: 140 min  Stress: No Stress Concern Present (05/01/2024)   [employer] of Occupational Health - Occupational Stress Questionnaire    Feeling of Stress: Only a little  Social Connections: Socially Integrated (05/01/2024)   Social Connection and Isolation Panel    Frequency of Communication with Friends and Family: Three times a week    Frequency of Social Gatherings with Friends and Family: More than three times a week    Attends Religious Services: More than 4 times per year    Active Member of Clubs or Organizations: Yes    Attends Engineer, structural: More than 4 times per year    Marital Status: Married    Tobacco Counseling Counseling given: Not Answered Tobacco comments: Never used    Clinical Intake:  Pre-visit preparation completed: Yes  Pain : No/denies pain     BMI - recorded: 25.84 Nutritional Status: BMI 25 -29 Overweight Nutritional Risks: None  Lab Results  Component Value Date   HGBA1C 5.7 (H) 02/04/2024     How often do you need to have someone help you when you read instructions, pamphlets, or other written materials from your doctor or pharmacy?: 1 - Never  Interpreter Needed?: No  Information  entered by :: Ellouise Haws, LPN   Activities of Daily Living     05/01/2024    9:00 AM  In  your present state of health, do you have any difficulty performing the following activities:  Hearing? 0  Vision? 0  Difficulty concentrating or making decisions? 0  Walking or climbing stairs? 0  Dressing or bathing? 0  Doing errands, shopping? 0  Preparing Food and eating ? N  Using the Toilet? N  In the past six months, have you accidently leaked urine? N  Do you have problems with loss of bowel control? N  Managing your Medications? N  Managing your Finances? N  Housekeeping or managing your Housekeeping? N    Patient Care Team: Katrinka Garnette KIDD, MD as PCP - General (Family Medicine) Elmira Newman PARAS, MD as Consulting Physician (Cardiology) Joshua Sieving, MD as Consulting Physician (Dermatology) Leslee Reusing, MD as Consulting Physician (Ophthalmology) Magdalen Pasco RAMAN, DPM as Consulting Physician (Podiatry) Zaldivar, Renzo A, MD as Attending Physician (Ophthalmology) Pandora Cadet, [facility] as Pharmacist (Pharmacist)  I have updated your Care Teams any recent Medical Services you may have received from other providers in the past year.     Assessment:   This is a routine wellness examination for Dale Gonzalez.  Hearing/Vision screen Hearing Screening - Comments:: Pt denies any hearing issues  Vision Screening - Comments:: Wears rx glasses - up to date with routine eye exams with [facility] and Dr Milan for eye care    Goals Addressed             This Visit's Progress    Patient Stated       Maintain health and activity        Depression Screen     05/05/2024    8:07 AM 04/30/2023    8:26 AM 01/29/2023    8:14 AM 05/03/2022   10:10 AM 06/16/2021   10:40 AM 04/03/2021    8:06 AM 04/19/2020   10:28 AM  PHQ 2/9 Scores  PHQ - 2 Score 0 0 1 0 0 0 0  PHQ- 9 Score   2    0    Fall Risk     05/01/2024    9:00 AM 04/29/2023    6:45 PM 01/29/2023    8:14 AM 05/03/2022   10:12 AM 06/16/2021   10:40 AM  Fall Risk   Falls in the past year? 0 0 0 0 0  Number falls in  past yr: 0 0 0 0 0  Injury with Fall?  0 0 0 0  Risk for fall due to : No Fall Risks Impaired vision No Fall Risks No Fall Risks;Impaired vision   Follow up Falls prevention discussed Falls prevention discussed Falls evaluation completed Falls prevention discussed       Data saved with a previous flowsheet row definition    MEDICARE RISK AT HOME:  Medicare Risk at Home Any stairs in or around the home?: (Patient-Rptd) Yes If so, are there any without handrails?: (Patient-Rptd) No Home free of loose throw rugs in walkways, pet beds, electrical cords, etc?: (Patient-Rptd) Yes Adequate lighting in your home to reduce risk of falls?: (Patient-Rptd) Yes Life alert?: (Patient-Rptd) No Use of a cane, walker or w/c?: (Patient-Rptd) No Grab bars in the bathroom?: (Patient-Rptd) Yes Shower chair or bench in shower?: (Patient-Rptd) No Elevated toilet seat or a handicapped toilet?: (Patient-Rptd) Yes  TIMED UP AND GO:  Was the test performed?  No  Cognitive Function: 6CIT  completed        05/05/2024    8:09 AM 04/30/2023    8:28 AM 05/03/2022   10:12 AM 04/03/2021    8:10 AM 03/28/2020    8:09 AM  6CIT Screen  What Year? 0 points 0 points 0 points 0 points   What month? 0 points 0 points 0 points 0 points   What time? 0 points 0 points 0 points 0 points   Count back from 20 0 points 0 points 0 points 0 points 0 points  Months in reverse 0 points 0 points 0 points 0 points 0 points  Repeat phrase 0 points 0 points 0 points 0 points 0 points  Total Score 0 points 0 points 0 points 0 points     Immunizations Immunization History  Administered Date(s) Administered   Fluad Quad(high Dose 65+) 05/17/2020, 05/01/2021, 06/21/2022   Influenza Inj Mdck Quad Pf 06/16/2016, 05/18/2017   Influenza Whole 07/08/2007, 05/20/2008, 08/04/2008, 06/01/2010   Influenza, Quadrivalent, Recombinant, Inj, Pf 05/03/2018, 04/09/2019   Influenza,inj,Quad PF,6+ Mos 05/18/2014   Influenza-Unspecified 07/01/2017,  04/08/2019   PFIZER Comirnaty(Gray Top)Covid-19 Tri-Sucrose Vaccine 02/16/2021   PFIZER(Purple Top)SARS-COV-2 Vaccination 09/05/2019, 09/23/2019, 05/11/2020   PNEUMOCOCCAL CONJUGATE-20 05/01/2021   Pneumococcal Conjugate-13 08/02/2015, 03/27/2016   Td 12/24/2008   Tdap 02/18/2014   Zoster Recombinant(Shingrix) 07/15/2017, 10/17/2017   Zoster, Live 01/04/2011    Screening Tests Health Maintenance  Topic Date Due   DTaP/Tdap/Td (3 - Td or Tdap) 02/19/2024   Influenza Vaccine  03/20/2024   COVID-19 Vaccine (5 - 2025-26 season) 04/20/2024   Medicare Annual Wellness (AWV)  05/05/2025   Colonoscopy  03/27/2032   Pneumococcal Vaccine: 50+ Years  Completed   Hepatitis C Screening  Completed   Zoster Vaccines- Shingrix  Completed   HPV VACCINES  Aged Out   Meningococcal B Vaccine  Aged Out    Health Maintenance Items Addressed: See Nurse Notes at the end of this note  Additional Screening:  Vision Screening: Recommended annual ophthalmology exams for early detection of glaucoma and other disorders of the eye. Is the patient up to date with their annual eye exam?  Yes  Who is the provider or what is the name of the office in which the patient attends annual eye exams? [facility] and Dr Milan for eye care   Dental Screening: Recommended annual dental exams for proper oral hygiene  Community Resource Referral / Chronic Care Management: CRR required this visit?  No   CCM required this visit?  No   Plan:    I have personally reviewed and noted the following in the patient's chart:   Medical and social history Use of alcohol , tobacco or illicit drugs  Current medications and supplements including opioid prescriptions. Patient is not currently taking opioid prescriptions. Functional ability and status Nutritional status Physical activity Advanced directives List of other physicians Hospitalizations, surgeries, and ER visits in previous 12 months Vitals Screenings to  include cognitive, depression, and falls Referrals and appointments  In addition, I have reviewed and discussed with patient certain preventive protocols, quality metrics, and best practice recommendations. A written personalized care plan for preventive services as well as general preventive health recommendations were provided to patient.   Ellouise VEAR Haws, LPN   [id number]   After Visit Summary: (MyChart) Due to this being a telephonic visit, the after visit summary with patients personalized plan was offered to patient via MyChart   Notes: Nothing significant to report at this time. Vaccinations discussed at AWV

## 2024-05-11 DIAGNOSIS — H2511 Age-related nuclear cataract, right eye: Secondary | ICD-10-CM | POA: Diagnosis not present

## 2024-05-12 DIAGNOSIS — H2512 Age-related nuclear cataract, left eye: Secondary | ICD-10-CM | POA: Diagnosis not present

## 2024-05-12 DIAGNOSIS — M25579 Pain in unspecified ankle and joints of unspecified foot: Secondary | ICD-10-CM | POA: Diagnosis not present

## 2024-05-19 DIAGNOSIS — M25579 Pain in unspecified ankle and joints of unspecified foot: Secondary | ICD-10-CM | POA: Diagnosis not present

## 2024-05-25 DIAGNOSIS — H2512 Age-related nuclear cataract, left eye: Secondary | ICD-10-CM | POA: Diagnosis not present

## 2024-05-25 DIAGNOSIS — H25042 Posterior subcapsular polar age-related cataract, left eye: Secondary | ICD-10-CM | POA: Diagnosis not present

## 2024-05-25 DIAGNOSIS — H25012 Cortical age-related cataract, left eye: Secondary | ICD-10-CM | POA: Diagnosis not present

## 2024-05-26 DIAGNOSIS — M25579 Pain in unspecified ankle and joints of unspecified foot: Secondary | ICD-10-CM | POA: Diagnosis not present

## 2024-06-08 DIAGNOSIS — M25579 Pain in unspecified ankle and joints of unspecified foot: Secondary | ICD-10-CM | POA: Diagnosis not present

## 2024-06-25 DIAGNOSIS — M25579 Pain in unspecified ankle and joints of unspecified foot: Secondary | ICD-10-CM | POA: Diagnosis not present

## 2024-07-03 DIAGNOSIS — M25579 Pain in unspecified ankle and joints of unspecified foot: Secondary | ICD-10-CM | POA: Diagnosis not present

## 2024-07-13 DIAGNOSIS — M25579 Pain in unspecified ankle and joints of unspecified foot: Secondary | ICD-10-CM | POA: Diagnosis not present

## 2024-07-24 DIAGNOSIS — M25579 Pain in unspecified ankle and joints of unspecified foot: Secondary | ICD-10-CM | POA: Diagnosis not present

## 2024-07-31 DIAGNOSIS — M25579 Pain in unspecified ankle and joints of unspecified foot: Secondary | ICD-10-CM | POA: Diagnosis not present

## 2024-09-07 ENCOUNTER — Other Ambulatory Visit: Payer: Self-pay | Admitting: Cardiology

## 2024-09-07 DIAGNOSIS — I10 Essential (primary) hypertension: Secondary | ICD-10-CM

## 2024-09-07 DIAGNOSIS — Z79899 Other long term (current) drug therapy: Secondary | ICD-10-CM

## 2024-09-10 ENCOUNTER — Ambulatory Visit: Admitting: Cardiology

## 2024-10-14 ENCOUNTER — Ambulatory Visit: Admitting: Cardiology

## 2024-12-03 ENCOUNTER — Ambulatory Visit: Admitting: Cardiology

## 2025-02-04 ENCOUNTER — Encounter: Admitting: Family Medicine
